# Patient Record
Sex: Female | Born: 1935
Health system: Southern US, Community
[De-identification: ages and names within clinical notes are randomized; demographics above are authoritative.]

## PROBLEM LIST (undated history)

## (undated) DIAGNOSIS — I10 Essential (primary) hypertension: Secondary | ICD-10-CM

## (undated) DIAGNOSIS — R7303 Prediabetes: Secondary | ICD-10-CM

## (undated) DIAGNOSIS — G5602 Carpal tunnel syndrome, left upper limb: Secondary | ICD-10-CM

## (undated) DIAGNOSIS — E785 Hyperlipidemia, unspecified: Secondary | ICD-10-CM

## (undated) DIAGNOSIS — K5900 Constipation, unspecified: Secondary | ICD-10-CM

## (undated) DIAGNOSIS — M1990 Unspecified osteoarthritis, unspecified site: Secondary | ICD-10-CM

## (undated) DIAGNOSIS — K219 Gastro-esophageal reflux disease without esophagitis: Secondary | ICD-10-CM

## (undated) HISTORY — PX: TONSILLECTOMY: SUR1361

## (undated) HISTORY — PX: ABDOMINAL HYSTERECTOMY: SHX81

## (undated) HISTORY — PX: APPENDECTOMY: SHX54

---

## 1996-11-07 HISTORY — PX: BACK SURGERY: SHX140

## 2000-09-19 ENCOUNTER — Ambulatory Visit (HOSPITAL_BASED_OUTPATIENT_CLINIC_OR_DEPARTMENT_OTHER): Admission: RE | Admit: 2000-09-19 | Discharge: 2000-09-19 | Payer: Self-pay | Admitting: Orthopedic Surgery

## 2001-11-27 ENCOUNTER — Emergency Department (HOSPITAL_COMMUNITY): Admission: EM | Admit: 2001-11-27 | Discharge: 2001-11-27 | Payer: Self-pay | Admitting: Emergency Medicine

## 2001-11-27 ENCOUNTER — Encounter: Payer: Self-pay | Admitting: Emergency Medicine

## 2001-12-28 ENCOUNTER — Ambulatory Visit (HOSPITAL_COMMUNITY): Admission: RE | Admit: 2001-12-28 | Discharge: 2001-12-28 | Payer: Self-pay | Admitting: Internal Medicine

## 2002-04-25 ENCOUNTER — Emergency Department (HOSPITAL_COMMUNITY): Admission: EM | Admit: 2002-04-25 | Discharge: 2002-04-26 | Payer: Self-pay | Admitting: Internal Medicine

## 2002-04-26 ENCOUNTER — Encounter: Payer: Self-pay | Admitting: Internal Medicine

## 2002-11-11 ENCOUNTER — Emergency Department (HOSPITAL_COMMUNITY): Admission: EM | Admit: 2002-11-11 | Discharge: 2002-11-11 | Payer: Self-pay | Admitting: *Deleted

## 2002-11-11 ENCOUNTER — Encounter: Payer: Self-pay | Admitting: *Deleted

## 2002-11-19 ENCOUNTER — Ambulatory Visit (HOSPITAL_BASED_OUTPATIENT_CLINIC_OR_DEPARTMENT_OTHER): Admission: RE | Admit: 2002-11-19 | Discharge: 2002-11-19 | Payer: Self-pay | Admitting: Orthopedic Surgery

## 2003-08-31 ENCOUNTER — Encounter: Payer: Self-pay | Admitting: Emergency Medicine

## 2003-08-31 ENCOUNTER — Emergency Department (HOSPITAL_COMMUNITY): Admission: EM | Admit: 2003-08-31 | Discharge: 2003-08-31 | Payer: Self-pay | Admitting: Internal Medicine

## 2003-09-30 ENCOUNTER — Ambulatory Visit (HOSPITAL_COMMUNITY): Admission: RE | Admit: 2003-09-30 | Discharge: 2003-09-30 | Payer: Self-pay | Admitting: Internal Medicine

## 2005-12-22 ENCOUNTER — Ambulatory Visit (HOSPITAL_BASED_OUTPATIENT_CLINIC_OR_DEPARTMENT_OTHER): Admission: RE | Admit: 2005-12-22 | Discharge: 2005-12-22 | Payer: Self-pay | Admitting: Orthopedic Surgery

## 2012-02-01 ENCOUNTER — Encounter (INDEPENDENT_AMBULATORY_CARE_PROVIDER_SITE_OTHER): Payer: Self-pay | Admitting: *Deleted

## 2012-02-09 ENCOUNTER — Other Ambulatory Visit (INDEPENDENT_AMBULATORY_CARE_PROVIDER_SITE_OTHER): Payer: Self-pay | Admitting: *Deleted

## 2012-02-09 ENCOUNTER — Encounter (INDEPENDENT_AMBULATORY_CARE_PROVIDER_SITE_OTHER): Payer: Self-pay | Admitting: *Deleted

## 2012-02-09 ENCOUNTER — Telehealth (INDEPENDENT_AMBULATORY_CARE_PROVIDER_SITE_OTHER): Payer: Self-pay | Admitting: *Deleted

## 2012-02-09 DIAGNOSIS — Z1211 Encounter for screening for malignant neoplasm of colon: Secondary | ICD-10-CM

## 2012-02-09 NOTE — Telephone Encounter (Signed)
Patient needs movi prep 

## 2012-02-15 MED ORDER — PEG-KCL-NACL-NASULF-NA ASC-C 100 G PO SOLR: 1.0000 | Freq: Once | ORAL | Status: DC

## 2012-02-17 ENCOUNTER — Encounter (INDEPENDENT_AMBULATORY_CARE_PROVIDER_SITE_OTHER): Payer: Self-pay

## 2012-04-03 ENCOUNTER — Telehealth (INDEPENDENT_AMBULATORY_CARE_PROVIDER_SITE_OTHER): Payer: Self-pay | Admitting: *Deleted

## 2012-04-03 NOTE — Telephone Encounter (Signed)
PCP/Requesting MD: fagan  Name & DOB: Terri Mccall 07/31/1934     Procedure: tcs  Reason/Indication:  screening  Has patient had this procedure before?  yes  If so, when, by whom and where?  2003  Is there a family history of colon cancer?  no  Who?  What age when diagnosed?    Is patient diabetic?   Yes, diet controlled      Does patient have prosthetic heart valve?  no  Do you have a pacemaker?  no  Has patient had joint replacement within last 12 months?  no  Is patient on Coumadin, Plavix and/or Aspirin? yes  Medications: asa 81 mg daily, benicar 40 mg daily, atenolol 50 mg bid, chlorthalidone 25 mg daily, simvastatin 20 mg daily,potassium 20 meq daily, calcium, fish oil  Allergies: nkda  Medication Adjustment: asa 2 days  Procedure date & time: 04/26/12 @ 830

## 2012-04-05 NOTE — Telephone Encounter (Signed)
agree

## 2012-04-24 ENCOUNTER — Encounter (HOSPITAL_COMMUNITY): Payer: Self-pay | Admitting: Pharmacy Technician

## 2012-04-25 MED ORDER — SODIUM CHLORIDE 0.45 % IV SOLN
Freq: Once | INTRAVENOUS | Status: AC
  Administered 2012-04-26: 08:00:00 via INTRAVENOUS

## 2012-04-26 ENCOUNTER — Encounter (HOSPITAL_COMMUNITY): Payer: Self-pay | Admitting: *Deleted

## 2012-04-26 ENCOUNTER — Ambulatory Visit (HOSPITAL_COMMUNITY)
Admission: RE | Admit: 2012-04-26 | Discharge: 2012-04-26 | Disposition: A | Discharge status: Home Health | Payer: Medicare Other | Source: Ambulatory Visit | Attending: Internal Medicine | Admitting: Internal Medicine

## 2012-04-26 ENCOUNTER — Encounter (HOSPITAL_COMMUNITY)
Admission: RE | Disposition: A | Discharge status: Home Health | Payer: Self-pay | Source: Ambulatory Visit | Attending: Internal Medicine

## 2012-04-26 DIAGNOSIS — Z1211 Encounter for screening for malignant neoplasm of colon: Secondary | ICD-10-CM

## 2012-04-26 DIAGNOSIS — E785 Hyperlipidemia, unspecified: Secondary | ICD-10-CM | POA: Insufficient documentation

## 2012-04-26 DIAGNOSIS — E119 Type 2 diabetes mellitus without complications: Secondary | ICD-10-CM | POA: Insufficient documentation

## 2012-04-26 DIAGNOSIS — Z7982 Long term (current) use of aspirin: Secondary | ICD-10-CM | POA: Insufficient documentation

## 2012-04-26 DIAGNOSIS — I10 Essential (primary) hypertension: Secondary | ICD-10-CM | POA: Insufficient documentation

## 2012-04-26 DIAGNOSIS — D126 Benign neoplasm of colon, unspecified: Secondary | ICD-10-CM | POA: Insufficient documentation

## 2012-04-26 DIAGNOSIS — Z79899 Other long term (current) drug therapy: Secondary | ICD-10-CM | POA: Insufficient documentation

## 2012-04-26 DIAGNOSIS — K644 Residual hemorrhoidal skin tags: Secondary | ICD-10-CM

## 2012-04-26 HISTORY — PX: COLONOSCOPY: SHX5424

## 2012-04-26 HISTORY — DX: Essential (primary) hypertension: I10

## 2012-04-26 HISTORY — DX: Constipation, unspecified: K59.00

## 2012-04-26 HISTORY — DX: Hyperlipidemia, unspecified: E78.5

## 2012-04-26 HISTORY — DX: Unspecified osteoarthritis, unspecified site: M19.90

## 2012-04-26 SURGERY — COLONOSCOPY
Anesthesia: Moderate Sedation

## 2012-04-26 MED ORDER — MIDAZOLAM HCL 5 MG/5ML IJ SOLN
INTRAMUSCULAR | Status: AC
  Filled 2012-04-26: qty 10

## 2012-04-26 MED ORDER — MIDAZOLAM HCL 5 MG/5ML IJ SOLN
INTRAMUSCULAR | Status: DC | PRN
  Administered 2012-04-26 (×2): 2 mg via INTRAVENOUS
  Administered 2012-04-26 (×2): 1 mg via INTRAVENOUS

## 2012-04-26 MED ORDER — MEPERIDINE HCL 50 MG/ML IJ SOLN
INTRAMUSCULAR | Status: DC | PRN
  Administered 2012-04-26 (×2): 25 mg via INTRAVENOUS

## 2012-04-26 MED ORDER — MEPERIDINE HCL 50 MG/ML IJ SOLN
INTRAMUSCULAR | Status: AC
  Filled 2012-04-26: qty 1

## 2012-04-26 MED ORDER — STERILE WATER FOR IRRIGATION IR SOLN
Status: DC | PRN
  Administered 2012-04-26: 09:00:00

## 2012-04-26 NOTE — Discharge Instructions (Signed)
Resume usual medications and high fiber diet. No driving for 24 hours. Physician will contact you with biopsy results.  Colonoscopy Care After These instructions give you information on caring for yourself after your procedure. Your doctor may also give you more specific instructions. Call your doctor if you have any problems or questions after your procedure. HOME CARE  Take it easy for the next 24 hours.   Rest.   Walk or use warm packs on your belly (abdomen) if you have belly cramping or gas.   Do not drive for 24 hours.   You may shower.   Do not sign important papers or use machinery for 24 hours.   Drink enough fluids to keep your pee (urine) clear or pale yellow.   Resume your normal diet. Avoid heavy or fried foods.   Avoid alcohol.   Continue taking your normal medicines.   Only take medicine as told by your doctor. Do not take aspirin.  If you had growths (polyps) removed:  Do not take aspirin.   Do not drink alcohol for 7 days or as told by your doctor.   Eat a soft diet for 24 hours.  GET HELP RIGHT AWAY IF:  You have a fever.   You pass clumps of tissue (blood clots) or fill the toilet with blood.   You have belly pain that gets worse and medicine does not help.   Your belly is puffy (swollen).   You feel sick to your stomach (nauseous) or throw up (vomit).  MAKE SURE YOU:  Understand these instructions.   Will watch your condition.   Will get help right away if you are not doing well or get worse.  Document Released: 11/26/2010 Document Revised: 10/13/2011 Document Reviewed: 11/26/2010 [facility] Patient Information 2012 [place], [place].  Colon Polyps A polyp is extra tissue that grows inside your body. Colon polyps grow in the large intestine. The large intestine, also called the colon, is part of your digestive system. It is a long, hollow tube at the end of your digestive tract where your body makes and stores stool. Most polyps are not  dangerous. They are benign. This means they are not cancerous. But over time, some types of polyps can turn into cancer. Polyps that are smaller than a pea are usually not harmful. But larger polyps could someday become or may already be cancerous. To be safe, doctors remove all polyps and test them.  WHO GETS POLYPS? Anyone can get polyps, but certain people are more likely than others. You may have a greater chance of getting polyps if:  You are over 50.   You have had polyps before.   Someone in your family has had polyps.   Someone in your family has had cancer of the large intestine.   Find out if someone in your family has had polyps. You may also be more likely to get polyps if you:   Eat a lot of fatty foods.   Smoke.   Drink alcohol.   Do not exercise.   Eat too much.  SYMPTOMS  Most small polyps do not cause symptoms. People often do not know they have one until their caregiver finds it during a regular checkup or while testing them for something else. Some people do have symptoms like these:  Bleeding from the anus. You might notice blood on your underwear or on toilet paper after you have had a bowel movement.   Constipation or diarrhea that lasts more than a  week.   Blood in the stool. Blood can make stool look black or it can show up as red streaks in the stool.  If you have any of these symptoms, see your caregiver. HOW DOES THE DOCTOR TEST FOR POLYPS? The doctor can use four tests to check for polyps:  Digital rectal exam. The caregiver wears gloves and checks your rectum (the last part of the large intestine) to see if it feels normal. This test would find polyps only in the rectum. Your caregiver may need to do one of the other tests listed below to find polyps higher up in the intestine.   Barium enema. The caregiver puts a liquid called barium into your rectum before taking x-rays of your large intestine. Barium makes your intestine look white in the  pictures. Polyps are dark, so they are easy to see.   Sigmoidoscopy. With this test, the caregiver can see inside your large intestine. A thin flexible tube is placed into your rectum. The device is called a sigmoidoscope, which has a light and a tiny video camera in it. The caregiver uses the sigmoidoscope to look at the last third of your large intestine.   Colonoscopy. This test is like sigmoidoscopy, but the caregiver looks at all of the large intestine. It usually requires sedation. This is the most common method for finding and removing polyps.  TREATMENT   The caregiver will remove the polyp during sigmoidoscopy or colonoscopy. The polyp is then tested for cancer.   If you have had polyps, your caregiver may want you to get tested regularly in the future.  PREVENTION  There is not one sure way to prevent polyps. You might be able to lower your risk of getting them if you:  Eat more fruits and vegetables and less fatty food.   Do not smoke.   Avoid alcohol.   Exercise every day.   Lose weight if you are overweight.   Eating more calcium and folate can also lower your risk of getting polyps. Some foods that are rich in calcium are milk, cheese, and broccoli. Some foods that are rich in folate are chickpeas, kidney beans, and spinach.   Aspirin might help prevent polyps. Studies are under way.  Document Released: 07/20/2004 Document Revised: 10/13/2011 Document Reviewed: 12/26/2007 [facility] Patient Information 2012 [place], [place].

## 2012-04-26 NOTE — H&P (Signed)
Terri Mccall is an 78 y.o. female.   Chief Complaint: Patient is here for colonoscopy. HPI: Patient is 78 year old Caucasian female with a screening colonoscopy. Patient's last exam 10 years ago. She denies abdominal pain or rectal bleeding. She has chronic constipation easily controlled with high fiber diet and MiraLax. Family history is negative for colorectal carcinoma.  Past Medical History  Diagnosis Date  . Hypertension   . Hyperlipidemia   . Diabetes mellitus     diet controlled  . Constipation   . Arthritis     Past Surgical History  Procedure Date  . Abdominal hysterectomy   . Tonsillectomy   . Back surgery 1998  . Appendectomy     History reviewed. No pertinent family history. Social History:  reports that she has never smoked. She does not have any smokeless tobacco history on file. She reports that she drinks alcohol. She reports that she does not use illicit drugs.  Allergies:  Allergies  Allergen Reactions  . Bextra (Valdecoxib) Hives    Medications Prior to Admission  Medication Sig Dispense Refill  . atenolol (TENORMIN) 50 MG tablet Take 50 mg by mouth 2 (two) times daily.      . beta carotene w/minerals (OCUVITE) tablet Take 1 tablet by mouth every morning.      . chlorthalidone (HYGROTON) 25 MG tablet Take 25 mg by mouth every morning.      . Cinnamon 500 MG capsule Take 500 mg by mouth daily.      . Cranberry (SM CRANBERRY) 300 MG tablet Take 300 mg by mouth every morning.      . loratadine (CLARITIN) 10 MG tablet Take 10 mg by mouth every morning.      . naproxen sodium (ANAPROX) 220 MG tablet Take 220 mg by mouth 2 (two) times daily with a meal.      . olmesartan (BENICAR) 40 MG tablet Take 40 mg by mouth every morning.      . peg 3350 powder (MOVIPREP) SOLR Take 1 kit (100 g total) by mouth once.  1 kit  0  . potassium chloride SA (K-DUR,KLOR-CON) 20 MEQ tablet Take 20 mEq by mouth every morning.      Marland Kitchen Propylene Glycol (SYSTANE BALANCE) 0.6 % SOLN  Apply 1 drop to eye 3 (three) times daily as needed. For dry eyes      . pyridOXINE (VITAMIN B-6) 100 MG tablet Take 100 mg by mouth every morning.      . simvastatin (ZOCOR) 20 MG tablet Take 20 mg by mouth every evening.      . thiamine (VITAMIN B-1) 100 MG tablet Take 100 mg by mouth every morning.      . vitamin B-12 (CYANOCOBALAMIN) 1000 MCG tablet Take 1,000 mcg by mouth every morning.      Marland Kitchen aspirin EC 81 MG tablet Take 81 mg by mouth every evening.        No results found for this or any previous visit (from the past 48 hour(s)). No results found.  ROS  Blood pressure 171/74, pulse 79, temperature 97.8 F (36.6 C), temperature source Oral, resp. rate 12, height 5\' 1"  (1.549 m), weight 148 lb (67.132 kg), SpO2 96.00%. Physical Exam  Constitutional: She appears well-developed and well-nourished.  HENT:  Mouth/Throat: Oropharynx is clear and moist.  Eyes: Conjunctivae are normal. No scleral icterus.  Neck: No thyromegaly present.  Cardiovascular: Normal rate, regular rhythm and normal heart sounds.   No murmur heard. Respiratory: Effort normal and  breath sounds normal.  GI: Soft. She exhibits no distension and no mass. There is no tenderness.  Musculoskeletal: She exhibits no edema.  Lymphadenopathy:    She has no cervical adenopathy.  Neurological: She is alert.  Skin: Skin is warm and dry.     Assessment/Plan Average risk screening colonoscopy.  Vivan Vanderveer U 04/26/2012, 8:52 AM

## 2012-04-26 NOTE — Op Note (Signed)
COLONOSCOPY PROCEDURE REPORT  PATIENT:  Terri Mccall  MR#:  [id number] Birthdate:  08-30-1934, 78 y.o., female Endoscopist:  Dr. Malissa Hippo, MD Referred By:  Dr. Carylon Perches, MD Procedure Date: 04/26/2012  Procedure:   Colonoscopy  Indications: Patient is 78 year old Caucasian female who is in for average risk screening colonoscopy. Last exam was 10 years ago.  Informed Consent:  The procedure and risks were reviewed with the patient and informed consent was obtained.  Medications:  Demerol 50 mg IV Versed 6 mg IV  Description of procedure:  After a digital rectal exam was performed, that colonoscope was advanced from the anus through the rectum and colon to the area of the cecum, ileocecal valve and appendiceal orifice. The cecum was deeply intubated. These structures were well-seen and photographed for the record. From the level of the cecum and ileocecal valve, the scope was slowly and cautiously withdrawn. The mucosal surfaces were carefully surveyed utilizing scope tip to flexion to facilitate fold flattening as needed. The scope was pulled down into the rectum where a thorough exam including retroflexion was performed.  Findings:   Prep excellent. Small cecal polyp ablated via cold biopsy. Mucosa rest of the colon was normal. Normal rectal mucosa. Small hemorrhoids below the dentate line.  Therapeutic/Diagnostic Maneuvers Performed:  See  Complications:  None  Cecal Withdrawal Time:  10 minutes  Impression:  Examination performed to cecum. Small cecal polyp ablated via cold biopsy. External hemorrhoids.  Recommendations:  Standard instructions given. I will contact patient with results of biopsy. Given today's findings she will not need screening for other 10 years.  Rhonda Linan U  04/26/2012 9:29 AM  CC: Dr. Carylon Perches, MD & Dr. Bonnetta Barry ref. provider found

## 2012-04-30 ENCOUNTER — Encounter (HOSPITAL_COMMUNITY): Payer: Self-pay | Admitting: Internal Medicine

## 2012-05-01 ENCOUNTER — Encounter (INDEPENDENT_AMBULATORY_CARE_PROVIDER_SITE_OTHER): Payer: Self-pay | Admitting: *Deleted

## 2012-11-06 ENCOUNTER — Other Ambulatory Visit: Payer: Self-pay | Admitting: Orthopedic Surgery

## 2012-11-16 ENCOUNTER — Encounter (HOSPITAL_BASED_OUTPATIENT_CLINIC_OR_DEPARTMENT_OTHER)
Admission: RE | Admit: 2012-11-16 | Discharge: 2012-11-16 | Disposition: A | Discharge status: Home / Self Care | Payer: Medicare Other | Source: Ambulatory Visit | Attending: Orthopedic Surgery | Admitting: Orthopedic Surgery

## 2012-11-16 ENCOUNTER — Encounter (HOSPITAL_BASED_OUTPATIENT_CLINIC_OR_DEPARTMENT_OTHER): Payer: Self-pay | Admitting: *Deleted

## 2012-11-16 ENCOUNTER — Other Ambulatory Visit: Payer: Self-pay

## 2012-11-16 LAB — BASIC METABOLIC PANEL
Chloride: 96 mEq/L (ref 96–112)
GFR calc non Af Amer: 86 mL/min — ABNORMAL LOW (ref >90)
Glucose, Bld: 106 mg/dL — ABNORMAL HIGH (ref 70–99)
Potassium: 3.5 mEq/L (ref 3.5–5.1)
Sodium: 135 mEq/L (ref 135–145)

## 2012-11-16 NOTE — Progress Notes (Signed)
bmet and ekg done

## 2012-11-20 ENCOUNTER — Encounter (HOSPITAL_BASED_OUTPATIENT_CLINIC_OR_DEPARTMENT_OTHER): Payer: Self-pay | Admitting: Anesthesiology

## 2012-11-20 ENCOUNTER — Encounter (HOSPITAL_BASED_OUTPATIENT_CLINIC_OR_DEPARTMENT_OTHER): Payer: Self-pay

## 2012-11-20 ENCOUNTER — Encounter (HOSPITAL_BASED_OUTPATIENT_CLINIC_OR_DEPARTMENT_OTHER)
Admission: RE | Disposition: A | Discharge status: Home / Self Care | Payer: Self-pay | Source: Ambulatory Visit | Attending: Orthopedic Surgery

## 2012-11-20 ENCOUNTER — Ambulatory Visit (HOSPITAL_BASED_OUTPATIENT_CLINIC_OR_DEPARTMENT_OTHER)
Admission: RE | Admit: 2012-11-20 | Discharge: 2012-11-20 | Disposition: A | Discharge status: Home / Self Care | Payer: Medicare Other | Source: Ambulatory Visit | Attending: Orthopedic Surgery | Admitting: Orthopedic Surgery

## 2012-11-20 ENCOUNTER — Ambulatory Visit (HOSPITAL_BASED_OUTPATIENT_CLINIC_OR_DEPARTMENT_OTHER): Payer: Medicare Other | Admitting: Anesthesiology

## 2012-11-20 DIAGNOSIS — E119 Type 2 diabetes mellitus without complications: Secondary | ICD-10-CM | POA: Insufficient documentation

## 2012-11-20 DIAGNOSIS — Z01812 Encounter for preprocedural laboratory examination: Secondary | ICD-10-CM | POA: Insufficient documentation

## 2012-11-20 DIAGNOSIS — M65839 Other synovitis and tenosynovitis, unspecified forearm: Secondary | ICD-10-CM | POA: Insufficient documentation

## 2012-11-20 DIAGNOSIS — M6530 Trigger finger, unspecified finger: Secondary | ICD-10-CM | POA: Insufficient documentation

## 2012-11-20 DIAGNOSIS — M65849 Other synovitis and tenosynovitis, unspecified hand: Secondary | ICD-10-CM | POA: Insufficient documentation

## 2012-11-20 DIAGNOSIS — Z7982 Long term (current) use of aspirin: Secondary | ICD-10-CM | POA: Insufficient documentation

## 2012-11-20 DIAGNOSIS — E785 Hyperlipidemia, unspecified: Secondary | ICD-10-CM | POA: Insufficient documentation

## 2012-11-20 DIAGNOSIS — I10 Essential (primary) hypertension: Secondary | ICD-10-CM | POA: Insufficient documentation

## 2012-11-20 DIAGNOSIS — Z79899 Other long term (current) drug therapy: Secondary | ICD-10-CM | POA: Insufficient documentation

## 2012-11-20 HISTORY — PX: TRIGGER FINGER RELEASE: SHX641

## 2012-11-20 LAB — POCT HEMOGLOBIN-HEMACUE: Hemoglobin: 13.5 g/dL (ref 12.0–15.0)

## 2012-11-20 SURGERY — RELEASE, A1 PULLEY, FOR TRIGGER FINGER
Anesthesia: Regional | Site: Finger | Laterality: Right | Wound class: Clean

## 2012-11-20 MED ORDER — PROPOFOL INFUSION 10 MG/ML OPTIME
INTRAVENOUS | Status: DC | PRN
  Administered 2012-11-20: 75 ug/kg/min via INTRAVENOUS

## 2012-11-20 MED ORDER — LACTATED RINGERS IV SOLN
INTRAVENOUS | Status: DC
  Administered 2012-11-20 (×2): via INTRAVENOUS

## 2012-11-20 MED ORDER — FENTANYL CITRATE 0.05 MG/ML IJ SOLN
INTRAMUSCULAR | Status: DC | PRN
  Administered 2012-11-20: 50 ug via INTRAVENOUS

## 2012-11-20 MED ORDER — HYDROCODONE-ACETAMINOPHEN 5-325 MG PO TABS: 1.0000 | ORAL_TABLET | Freq: Four times a day (QID) | ORAL | Status: DC | PRN

## 2012-11-20 MED ORDER — HYDROMORPHONE HCL PF 1 MG/ML IJ SOLN: 0.2500 mg | INTRAMUSCULAR | Status: DC | PRN

## 2012-11-20 MED ORDER — OXYCODONE HCL 5 MG/5ML PO SOLN: 5.0000 mg | Freq: Once | ORAL | Status: DC | PRN

## 2012-11-20 MED ORDER — CEFAZOLIN SODIUM-DEXTROSE 2-3 GM-% IV SOLR
2.0000 g | INTRAVENOUS | Status: AC
  Administered 2012-11-20: 2 g via INTRAVENOUS

## 2012-11-20 MED ORDER — CHLORHEXIDINE GLUCONATE 4 % EX LIQD: 60.0000 mL | Freq: Once | CUTANEOUS | Status: DC

## 2012-11-20 MED ORDER — PROMETHAZINE HCL 25 MG/ML IJ SOLN: 6.2500 mg | INTRAMUSCULAR | Status: DC | PRN

## 2012-11-20 MED ORDER — MEPERIDINE HCL 25 MG/ML IJ SOLN: 6.2500 mg | INTRAMUSCULAR | Status: DC | PRN

## 2012-11-20 MED ORDER — OXYCODONE HCL 5 MG PO TABS: 5.0000 mg | ORAL_TABLET | Freq: Once | ORAL | Status: DC | PRN

## 2012-11-20 MED ORDER — BUPIVACAINE HCL (PF) 0.25 % IJ SOLN
INTRAMUSCULAR | Status: DC | PRN
  Administered 2012-11-20: 4 mL

## 2012-11-20 SURGICAL SUPPLY — 36 items
BANDAGE COBAN STERILE 2 (GAUZE/BANDAGES/DRESSINGS) ×2 IMPLANT
BANDAGE GAUZE ELAST BULKY 4 IN (GAUZE/BANDAGES/DRESSINGS) ×1 IMPLANT
BLADE SURG 15 STRL LF DISP TIS (BLADE) ×1 IMPLANT
BLADE SURG 15 STRL SS (BLADE) ×2
BNDG CMPR 9X4 STRL LF SNTH (GAUZE/BANDAGES/DRESSINGS)
BNDG ESMARK 4X9 LF (GAUZE/BANDAGES/DRESSINGS) IMPLANT
CHLORAPREP W/TINT 26ML (MISCELLANEOUS) ×2 IMPLANT
CLOTH BEACON ORANGE TIMEOUT ST (SAFETY) ×2 IMPLANT
CORDS BIPOLAR (ELECTRODE) IMPLANT
COVER MAYO STAND STRL (DRAPES) ×2 IMPLANT
COVER TABLE BACK 60X90 (DRAPES) ×2 IMPLANT
CUFF TOURNIQUET SINGLE 18IN (TOURNIQUET CUFF) ×1 IMPLANT
DECANTER SPIKE VIAL GLASS SM (MISCELLANEOUS) IMPLANT
DRAPE EXTREMITY T 121X128X90 (DRAPE) ×2 IMPLANT
DRAPE SURG 17X23 STRL (DRAPES) ×2 IMPLANT
GAUZE XEROFORM 1X8 LF (GAUZE/BANDAGES/DRESSINGS) ×2 IMPLANT
GLOVE BIO SURGEON STRL SZ 6.5 (GLOVE) ×3 IMPLANT
GLOVE BIO SURGEON STRL SZ7.5 (GLOVE) ×2 IMPLANT
GLOVE BIOGEL PI IND STRL 8.5 (GLOVE) ×1 IMPLANT
GLOVE BIOGEL PI INDICATOR 8.5 (GLOVE) ×1
GLOVE SURG ORTHO 8.0 STRL STRW (GLOVE) ×3 IMPLANT
GOWN BRE IMP PREV XXLGXLNG (GOWN DISPOSABLE) ×4 IMPLANT
GOWN PREVENTION PLUS XLARGE (GOWN DISPOSABLE) ×3 IMPLANT
NEEDLE 27GAX1X1/2 (NEEDLE) ×1 IMPLANT
NS IRRIG 1000ML POUR BTL (IV SOLUTION) ×2 IMPLANT
PACK BASIN DAY SURGERY FS (CUSTOM PROCEDURE TRAY) ×2 IMPLANT
PADDING CAST ABS 4INX4YD NS (CAST SUPPLIES) ×1
PADDING CAST ABS COTTON 4X4 ST (CAST SUPPLIES) ×1 IMPLANT
SPONGE GAUZE 4X4 12PLY (GAUZE/BANDAGES/DRESSINGS) ×2 IMPLANT
STOCKINETTE 4X48 STRL (DRAPES) ×2 IMPLANT
SUT VICRYL RAPIDE 4/0 PS 2 (SUTURE) ×2 IMPLANT
SYR BULB 3OZ (MISCELLANEOUS) ×2 IMPLANT
SYR CONTROL 10ML LL (SYRINGE) ×1 IMPLANT
TOWEL OR 17X24 6PK STRL BLUE (TOWEL DISPOSABLE) ×4 IMPLANT
UNDERPAD 30X30 INCONTINENT (UNDERPADS AND DIAPERS) ×2 IMPLANT
WATER STERILE IRR 1000ML POUR (IV SOLUTION) ×2 IMPLANT

## 2012-11-20 NOTE — Anesthesia Preprocedure Evaluation (Signed)
Anesthesia Evaluation  Patient identified by MRN, date of birth, ID band Patient awake    Reviewed: Allergy & Precautions, H&P , NPO status   History of Anesthesia Complications Negative for: history of anesthetic complications  Airway Mallampati: I  Neck ROM: Full    Dental  (+) Teeth Intact   Pulmonary neg pulmonary ROS,  breath sounds clear to auscultation        Cardiovascular hypertension, Rhythm:Regular Rate:Normal     Neuro/Psych negative neurological ROS     GI/Hepatic negative GI ROS, Neg liver ROS,   Endo/Other  diabetes  Renal/GU      Musculoskeletal   Abdominal   Peds  Hematology negative hematology ROS (+)   Anesthesia Other Findings   Reproductive/Obstetrics                           Anesthesia Physical Anesthesia Plan  ASA: II  Anesthesia Plan: Bier Block   Post-op Pain Management:    Induction: Intravenous  Airway Management Planned: Natural Airway and Simple Face Mask  Additional Equipment:   Intra-op Plan:   Post-operative Plan:   Informed Consent: I have reviewed the patients History and Physical, chart, labs and discussed the procedure including the risks, benefits and alternatives for the proposed anesthesia with the patient or authorized representative who has indicated his/her understanding and acceptance.     Plan Discussed with: CRNA and Surgeon  Anesthesia Plan Comments:         Anesthesia Quick Evaluation

## 2012-11-20 NOTE — Brief Op Note (Signed)
11/20/2012  10:16 AM  PATIENT:  Joan Flores  79 y.o. female  PRE-OPERATIVE DIAGNOSIS:  STS RIF  POST-OPERATIVE DIAGNOSIS:  STS RIF(stenosing tenosynovitis) right index finger  PROCEDURE:  Procedure(s) (LRB) with comments: RELEASE TRIGGER FINGER/A-1 PULLEY (Right) - RELEASE A-1 PULLEY RIGHT INDEX FINGER  SURGEON:  Surgeon(s) and Role:    * Nicki Reaper, MD - Primary    * Tami Ribas, MD - Assisting  PHYSICIAN ASSISTANT:   ASSISTANTS: K Knight Oelkers,MD   ANESTHESIA:   local and regional  EBL:  Total I/O In: 800 [I.V.:800] Out: -   BLOOD ADMINISTERED:none  DRAINS: none   LOCAL MEDICATIONS USED:  MARCAINE     SPECIMEN:  No Specimen  DISPOSITION OF SPECIMEN:  N/A  COUNTS:  YES  TOURNIQUET:   Total Tourniquet Time Documented: Forearm (Right) - 32 minutes  DICTATION: .Other Dictation: Dictation Number [id number]  PLAN OF CARE: Discharge to home after PACU  PATIENT DISPOSITION:  PACU - hemodynamically stable.

## 2012-11-20 NOTE — Transfer of Care (Signed)
Immediate Anesthesia Transfer of Care Note  Patient: Terri Mccall  Procedure(s) Performed: Procedure(s) (LRB) with comments: RELEASE TRIGGER FINGER/A-1 PULLEY (Right) - RELEASE A-1 PULLEY RIGHT INDEX FINGER  Patient Location: PACU  Anesthesia Type:Bier block  Level of Consciousness: awake and patient cooperative  Airway & Oxygen Therapy: Patient Spontanous Breathing and Patient connected to face mask oxygen  Post-op Assessment: Report given to PACU RN and Post -op Vital signs reviewed and stable  Post vital signs: Reviewed and stable  Complications: No apparent anesthesia complications

## 2012-11-20 NOTE — Op Note (Signed)
NAMEETHELDA, DEANGELO NO.:  [id number]  MEDICAL RECORD NO.:  [id number]  LOCATION:                                 FACILITY:  PHYSICIAN:  Cindee Salt, M.D.            DATE OF BIRTH:  DATE OF PROCEDURE:  11/20/2012 DATE OF DISCHARGE:                              OPERATIVE REPORT   PREOPERATIVE DIAGNOSIS:  Recurrence stenosing tenosynovitis right index finger.  POSTOPERATIVE DIAGNOSIS:  Recurrence stenosing tenosynovitis right index finger.  OPERATION:  Excision of ulnar limb superficialis tendon right index finger with release A1 pulley.  SURGEON:  Betha Loa, MD  ANESTHESIA:  Forearm-based IV regional with local infiltration.  ANESTHESIOLOGIST:  Burna Forts, M.D.  HISTORY:  The patient is a 79 year old female with a history of triggering of her right index finger.  She has undergone release of this approximately 4 years ago.  This has recurred and it has not responded to injections.  She is elected to have this surgically released with the possibility of excision of 1 limb of the superficialis tendon. Pre, peri, and postoperative course have been discussed along with risks and complications.  She is aware there is no guarantee with surgery; possibility of infection; recurrence of injury to arteries, nerves, tendons, incomplete relief of symptoms, and dystrophy.  In the preoperative area, the patient was seen, the extremity marked by both patient and surgeon.  Antibiotic given.  PROCEDURE:  The patient was brought to the operating room where a forearm-based IV regional anesthetic was carried out without difficulty. She was prepped using ChloraPrep, supine position with the right arm free.  A 3-minute dry time was allowed.  Time-out taken, confirming patient and procedure.  The old incision was used, carried down through subcutaneous tissue.  The A1 pulley was released after protection of neurovascular bundles with release there was still  continued triggering and locking of the finger.  The wound was extended distally.  This all to be in the ulnar limb of the superficialis tendon. The superficialis ulnar limb was then incised approximately, delivered distally at the A3 pulley, and removed the triggering entirely abated.  The tendon was smoothed proximally to allow no catching of the proximal aspect. Partial tenosynovectomy performed.  The wound was copiously irrigated with saline and the skin closed with interrupted with 4-0 Vicryl Rapide sutures.  Sterile compressive dressing to the index middle finger were applied.  On deflation of the tourniquet, all fingers immediately pinked prior to placement of the dressing, after closure the wound the area was infiltrated with 0.25% Marcaine without epinephrine, 5 mL was used.  The patient tolerated the procedure well and was taken to the recovery room for observation in satisfactory condition.  She will be discharged home, to return in 1 week on Vicodin.          ______________________________ Cindee Salt, M.D.     GK/MEDQ  D:  11/20/2012  T:  11/20/2012  Job:  [id number]

## 2012-11-20 NOTE — Op Note (Signed)
Dictation Number [id number]

## 2012-11-20 NOTE — H&P (Signed)
Terri Mccall  has developed popping of her right index finger. . She has had this released in 2004. She recalls no history of injury. She has diabetes diet controlled as well as elevated BP. This has been  injected for a second time with Celestone and Xylocaine in that it is still triggering.  , this is injected for a second time with Celestone and Xylocaine in that it is still triggering.  Sensation and circulation are otherwise intact.  She shows no swelling.    PAST MEDICAL HISTORY: She is allergic to Bextra. She is on Atenolol, Benicar, potassium, Chlorthalidone, simvastatin. She has had back surgery, hysterectomy and hand surgery.  FAMILY H ISTORY: Positive for diabetes, heart disease, and high BP.  SOCIAL HISTORY: She does not smoke. She drinks socially. She is married and retired.   REVIEW OF SYSTEMS: Positive for glasses, high BP, otherwise negative for 14 points. Terri Mccall is an 79 y.o. female.   Chief Complaint: Recurrent STS Rt index HPI: see above  Past Medical History  Diagnosis Date  . Hypertension   . Hyperlipidemia   . Diabetes mellitus     diet controlled  . Constipation   . Arthritis     Past Surgical History  Procedure Date  . Abdominal hysterectomy   . Tonsillectomy   . Back surgery 1998  . Appendectomy   . Colonoscopy 04/26/2012    Procedure: COLONOSCOPY;  Surgeon: Malissa Hippo, MD;  Location: [facility];  Service: Endoscopy;  Laterality: N/A;  830    History reviewed. No pertinent family history. Social History:  reports that she has never smoked. She does not have any smokeless tobacco history on file. She reports that she drinks alcohol. She reports that she does not use illicit drugs.  Allergies:  Allergies  Allergen Reactions  . Bextra (Valdecoxib) Hives    Medications Prior to Admission  Medication Sig Dispense Refill  . aspirin EC 81 MG tablet Take 81 mg by mouth every evening.      Marland Kitchen atenolol (TENORMIN) 50 MG tablet Take 50 mg by  mouth 2 (two) times daily.      . beta carotene w/minerals (OCUVITE) tablet Take 1 tablet by mouth every morning.      . chlorthalidone (HYGROTON) 25 MG tablet Take 25 mg by mouth every morning.      . Cinnamon 500 MG capsule Take 500 mg by mouth daily.      . Cranberry (SM CRANBERRY) 300 MG tablet Take 300 mg by mouth every morning.      . loratadine (CLARITIN) 10 MG tablet Take 10 mg by mouth every morning.      . naproxen sodium (ANAPROX) 220 MG tablet Take 220 mg by mouth 2 (two) times daily with a meal.      . olmesartan (BENICAR) 40 MG tablet Take 40 mg by mouth every morning.      . potassium chloride SA (K-DUR,KLOR-CON) 20 MEQ tablet Take 20 mEq by mouth every morning.      Marland Kitchen Propylene Glycol (SYSTANE BALANCE) 0.6 % SOLN Apply 1 drop to eye 3 (three) times daily as needed. For dry eyes      . pyridOXINE (VITAMIN B-6) 100 MG tablet Take 100 mg by mouth every morning.      . simvastatin (ZOCOR) 20 MG tablet Take 20 mg by mouth every evening.      . thiamine (VITAMIN B-1) 100 MG tablet Take 100 mg by mouth every morning.      Marland Kitchen  vitamin B-12 (CYANOCOBALAMIN) 1000 MCG tablet Take 1,000 mcg by mouth every morning.        No results found for this or any previous visit (from the past 48 hour(s)).  No results found.   Pertinent items are noted in HPI.  Blood pressure 178/81, pulse 75, temperature 97.7 F (36.5 C), temperature source Oral, resp. rate 20, height 5' 0.5" (1.537 m), weight 68.675 kg (151 lb 6.4 oz), SpO2 98.00%.  General appearance: alert, cooperative and appears stated age Head: Normocephalic, without obvious abnormality Neck: no adenopathy and no JVD Resp: clear to auscultation bilaterally Cardio: regular rate and rhythm, S1, S2 normal, no murmur, click, rub or gallop GI: soft, non-tender; bowel sounds normal; no masses,  no organomegaly Extremities: extremities normal, atraumatic, no cyanosis or edema Pulses: 2+ and symmetric Skin: Skin color, texture, turgor normal.  No rashes or lesions Neurologic: Grossly normal Incision/Wound: na  Assessment/Plan We would recommend release of the A-1 pulley right index finger possible excision of one limb of superficialis. She is in agreement. The pre, peri and post op course are discussed along with risks and complications.  She is aware there is no guarantee with surgery, possibility of infection, recurrence, injury to arteries, nerves and tendons, incomplete relief of symptoms and dystrophy.  She is scheduled for release A-1 pulley right index finger as an outpatient under regional anesthesia.   [name] 11/20/2012, 8:39 AM

## 2012-11-20 NOTE — Anesthesia Postprocedure Evaluation (Signed)
  Anesthesia Post-op Note  Patient: Terri Mccall  Procedure(s) Performed: Procedure(s) (LRB) with comments: RELEASE TRIGGER FINGER/A-1 PULLEY (Right) - RELEASE A-1 PULLEY RIGHT INDEX FINGER  Patient Location: PACU  Anesthesia Type:Bier block  Level of Consciousness: awake  Airway and Oxygen Therapy: Patient Spontanous Breathing  Post-op Pain: mild  Post-op Assessment: Post-op Vital signs reviewed  Post-op Vital Signs: stable  Complications: No apparent anesthesia complications

## 2012-11-21 ENCOUNTER — Encounter (HOSPITAL_BASED_OUTPATIENT_CLINIC_OR_DEPARTMENT_OTHER): Payer: Self-pay | Admitting: Orthopedic Surgery

## 2013-06-12 ENCOUNTER — Other Ambulatory Visit (HOSPITAL_COMMUNITY): Payer: Self-pay | Admitting: Ophthalmology

## 2013-06-12 DIAGNOSIS — G629 Polyneuropathy, unspecified: Secondary | ICD-10-CM

## 2013-06-17 ENCOUNTER — Ambulatory Visit (HOSPITAL_COMMUNITY)
Admission: RE | Admit: 2013-06-17 | Discharge: 2013-06-17 | Disposition: A | Discharge status: Home / Self Care | Payer: Medicare Other | Source: Ambulatory Visit | Attending: Ophthalmology | Admitting: Ophthalmology

## 2013-06-17 ENCOUNTER — Encounter (HOSPITAL_COMMUNITY): Payer: Self-pay

## 2013-06-17 DIAGNOSIS — G319 Degenerative disease of nervous system, unspecified: Secondary | ICD-10-CM | POA: Insufficient documentation

## 2013-06-17 DIAGNOSIS — H5460 Unqualified visual loss, one eye, unspecified: Secondary | ICD-10-CM | POA: Insufficient documentation

## 2013-06-17 DIAGNOSIS — E119 Type 2 diabetes mellitus without complications: Secondary | ICD-10-CM | POA: Insufficient documentation

## 2013-06-17 DIAGNOSIS — I10 Essential (primary) hypertension: Secondary | ICD-10-CM | POA: Insufficient documentation

## 2013-06-17 DIAGNOSIS — G629 Polyneuropathy, unspecified: Secondary | ICD-10-CM

## 2013-06-17 MED ORDER — GADOBENATE DIMEGLUMINE 529 MG/ML IV SOLN
14.0000 mL | Freq: Once | INTRAVENOUS | Status: AC | PRN
  Administered 2013-06-17: 14 mL via INTRAVENOUS

## 2013-06-18 LAB — POCT I-STAT, CHEM 8
BUN: 8 mg/dL (ref 6–23)
Calcium, Ion: 1.19 mmol/L (ref 1.13–1.30)
Chloride: 95 mEq/L — ABNORMAL LOW (ref 96–112)
Creatinine, Ser: 0.9 mg/dL (ref 0.50–1.10)
Glucose, Bld: 126 mg/dL — ABNORMAL HIGH (ref 70–99)
HCT: 44 % (ref 36.0–46.0)
Hemoglobin: 15 g/dL (ref 12.0–15.0)
Potassium: 3.3 mEq/L — ABNORMAL LOW (ref 3.5–5.1)
Sodium: 132 mEq/L — ABNORMAL LOW (ref 135–145)
TCO2: 26 mmol/L (ref 0–100)

## 2013-08-17 ENCOUNTER — Encounter (HOSPITAL_COMMUNITY): Payer: Self-pay | Admitting: Emergency Medicine

## 2013-08-17 ENCOUNTER — Emergency Department (HOSPITAL_COMMUNITY)
Admission: EM | Admit: 2013-08-17 | Discharge: 2013-08-17 | Disposition: A | Discharge status: Home / Self Care | Payer: Medicare Other | Attending: Emergency Medicine | Admitting: Emergency Medicine

## 2013-08-17 DIAGNOSIS — E119 Type 2 diabetes mellitus without complications: Secondary | ICD-10-CM | POA: Insufficient documentation

## 2013-08-17 DIAGNOSIS — M129 Arthropathy, unspecified: Secondary | ICD-10-CM | POA: Insufficient documentation

## 2013-08-17 DIAGNOSIS — Z7982 Long term (current) use of aspirin: Secondary | ICD-10-CM | POA: Insufficient documentation

## 2013-08-17 DIAGNOSIS — Z79899 Other long term (current) drug therapy: Secondary | ICD-10-CM | POA: Insufficient documentation

## 2013-08-17 DIAGNOSIS — I10 Essential (primary) hypertension: Secondary | ICD-10-CM | POA: Insufficient documentation

## 2013-08-17 DIAGNOSIS — Z8719 Personal history of other diseases of the digestive system: Secondary | ICD-10-CM | POA: Insufficient documentation

## 2013-08-17 DIAGNOSIS — E785 Hyperlipidemia, unspecified: Secondary | ICD-10-CM | POA: Insufficient documentation

## 2013-08-17 DIAGNOSIS — Z791 Long term (current) use of non-steroidal anti-inflammatories (NSAID): Secondary | ICD-10-CM | POA: Insufficient documentation

## 2013-08-17 NOTE — ED Notes (Signed)
Beeped [phone].Fanta

## 2013-08-17 NOTE — ED Provider Notes (Signed)
CSN: [id number]     Arrival date & time 08/17/13  1191 History  This chart was scribed for Dagmar Hait, MD by Leone Payor, ED Scribe. This patient was seen in room [facility]04/[facility]04 and the patient's care was started 10:19 AM.    Chief Complaint  Patient presents with  . Hypertension    Patient is a 79 y.o. female presenting with hypertension. The history is provided by the patient. No language interpreter was used.  Hypertension This is a chronic problem. The current episode started 2 days ago. The problem occurs constantly. The problem has been gradually worsening. Associated symptoms include headaches. Pertinent negatives include no chest pain, no abdominal pain and no shortness of breath. Nothing aggravates the symptoms. The symptoms are relieved by medications. Terri Mccall has tried nothing for the symptoms. The treatment provided no relief.    HPI Comments: Terri Mccall is a 79 y.o. female who presents to the Emergency Department complaining of hypertension. Pt states Terri Mccall was awakened this morning with heart palpitations that Terri Mccall describes as pounding in the chest. Terri Mccall states this pounding lasted about 5-10 minutes. Terri Mccall reports checking her HR which was 104 and BP was around 190/92. Pt also reports having a mild HA upon waking this morning. Pt states Terri Mccall used to take atenolol at night but was taken off of the night time dose on 08/05/13 by Dr. Ouida Sills. Pt states Terri Mccall was also started on amlodipine Pt states Terri Mccall had an episode of low BP in her right eye which caused her to have vision problems. Dr. Ouida Sills was concerned that taking the atenolol at night would cause her to have another drop in her eye BP. Pt states that since Terri Mccall has stopped the night time dose, Terri Mccall has noticed her morning BP to being in the 170s systolic. Terri Mccall reports her day time BP has been normal. Terri Mccall denies new visual disturbances, dizziness, chest pain, SOB, nausea, vomiting, diarrhea, numbness or weakness in the BLE.   PCP Dr.  Ouida Sills Past Medical History  Diagnosis Date  . Hypertension   . Hyperlipidemia   . Diabetes mellitus     diet controlled  . Constipation   . Arthritis    Past Surgical History  Procedure Laterality Date  . Abdominal hysterectomy    . Tonsillectomy    . Back surgery  1998  . Appendectomy    . Colonoscopy  04/26/2012    Procedure: COLONOSCOPY;  Surgeon: Malissa Hippo, MD;  Location: [facility];  Service: Endoscopy;  Laterality: N/A;  830  . Trigger finger release  11/20/2012    Procedure: RELEASE TRIGGER FINGER/A-1 PULLEY;  Surgeon: Nicki Reaper, MD;  Location: [facility];  Service: Orthopedics;  Laterality: Right;  RELEASE A-1 PULLEY RIGHT INDEX FINGER   No family history on file. History  Substance Use Topics  . Smoking status: Never Smoker   . Smokeless tobacco: Not on file  . Alcohol Use: Yes     Comment: rare   OB History   Grav Para Term Preterm Abortions TAB SAB Ect Mult Living                 Review of Systems  Eyes: Negative for visual disturbance.  Respiratory: Negative for shortness of breath.   Cardiovascular: Negative for chest pain.  Gastrointestinal: Negative for nausea, vomiting, abdominal pain and diarrhea.  Neurological: Positive for headaches. Negative for dizziness, weakness and numbness.  All other systems reviewed and are negative.  Allergies  Bextra  Home Medications   Current Outpatient Rx  Name  Route  Sig  Dispense  Refill  . aspirin EC 81 MG tablet   Oral   Take 81 mg by mouth every evening.         Marland Kitchen atenolol (TENORMIN) 50 MG tablet   Oral   Take 50 mg by mouth 2 (two) times daily.         . beta carotene w/minerals (OCUVITE) tablet   Oral   Take 1 tablet by mouth every morning.         . chlorthalidone (HYGROTON) 25 MG tablet   Oral   Take 25 mg by mouth every morning.         . Cinnamon 500 MG capsule   Oral   Take 500 mg by mouth daily.         . Cranberry (SM CRANBERRY) 300 MG tablet    Oral   Take 300 mg by mouth every morning.         Marland Kitchen HYDROcodone-acetaminophen (NORCO) 5-325 MG per tablet   Oral   Take 1 tablet by mouth every 6 (six) hours as needed for pain.   30 tablet   0   . loratadine (CLARITIN) 10 MG tablet   Oral   Take 10 mg by mouth every morning.         . naproxen sodium (ANAPROX) 220 MG tablet   Oral   Take 220 mg by mouth 2 (two) times daily with a meal.         . olmesartan (BENICAR) 40 MG tablet   Oral   Take 40 mg by mouth every morning.         . potassium chloride SA (K-DUR,KLOR-CON) 20 MEQ tablet   Oral   Take 20 mEq by mouth every morning.         Marland Kitchen Propylene Glycol (SYSTANE BALANCE) 0.6 % SOLN   Ophthalmic   Apply 1 drop to eye 3 (three) times daily as needed. For dry eyes         . pyridOXINE (VITAMIN B-6) 100 MG tablet   Oral   Take 100 mg by mouth every morning.         . simvastatin (ZOCOR) 20 MG tablet   Oral   Take 20 mg by mouth every evening.         . thiamine (VITAMIN B-1) 100 MG tablet   Oral   Take 100 mg by mouth every morning.         . vitamin B-12 (CYANOCOBALAMIN) 1000 MCG tablet   Oral   Take 1,000 mcg by mouth every morning.          BP 182/62  Pulse 77  Temp(Src) 97.7 F (36.5 C) (Oral)  Resp 16  Ht 5' 0.5" (1.537 m)  Wt 150 lb (68.04 kg)  BMI 28.8 kg/m2  SpO2 97% Physical Exam  Nursing note and vitals reviewed. Constitutional: Terri Mccall is oriented to person, place, and time. Terri Mccall appears well-developed and well-nourished.  HENT:  Head: Normocephalic and atraumatic.  Eyes: Conjunctivae and EOM are normal. Pupils are equal, round, and reactive to light.  Neck: Normal range of motion. Neck supple.  Cardiovascular: Normal rate, regular rhythm, normal heart sounds and intact distal pulses.   Pulmonary/Chest: Effort normal and breath sounds normal. No respiratory distress. Terri Mccall has no wheezes. Terri Mccall has no rales. Terri Mccall exhibits no tenderness.  Abdominal: Soft. Bowel sounds are normal.  There is no tenderness.  Musculoskeletal: Normal range of motion. Terri Mccall exhibits no edema and no tenderness.  Neurological: Terri Mccall is alert and oriented to person, place, and time. Terri Mccall has normal strength and normal reflexes. Terri Mccall displays normal reflexes. No cranial nerve deficit or sensory deficit. Terri Mccall exhibits normal muscle tone. Coordination and gait normal.  Skin: Skin is warm and dry.  Psychiatric: Terri Mccall has a normal mood and affect.    ED Course  Procedures   DIAGNOSTIC STUDIES: Oxygen Saturation is 97% on RA, normal by my interpretation.    COORDINATION OF CARE: 10:18 AM Discussed treatment plan with pt at bedside and pt agreed to plan.    Labs Review Labs Reviewed - No data to display Imaging Review No results found.  EKG Interpretation   None       MDM   1. Hypertension    79 year old female with history of hypertension. Terri Mccall's awoken the past few mornings with elevated blood pressures in the 180s. Terri Mccall's recently stopped on her nighttime atenolol for concerns of nocturnal hypotension leading to vision problems. Terri Mccall stated some heart racing this morning, when Terri Mccall checked her blood pressure pulse is 104. Terri Mccall denied any chest pain, dizziness, shortness of breath, new vision problems, nausea, vomiting, diarrhea, abdominal pain. Terri Mccall denies any numbness or weakness in arms or legs. Terri Mccall denies any ataxia. Here Terri Mccall is doing very well, lisinopril 20. Pressures are in the 180s over 70s. Patient has normal neuro exam is normal gait, normal sensation, normal cranial nerves. Terri Mccall has clear lungs and heart sounds normal. Terri Mccall's not tachycardic, rate in the 70s on the monitor in sinus rhythm. Spoke with Dr. Felecia Shelling, who is on-call for PCP, Dr. Ouida Sills, about her problems. He recommended increasing amlodipine. Patient states Terri Mccall did once again noted pain more than Terri Mccall had to, so we discussed possibly returning her atenolol at night, does have the dose. He states it is a good idea to go to 25 mg of  atenolol at night, Terri Mccall was currently taking 50. He recommended followup next week. I do not feel like patient has hypertensive urgency or emergency at this time. We will observe her for a little while to monitor BPs. Terri Mccall did take her anti-hypertensives this morning. Repeat BPs much lower. Stable for discharge.   I personally performed the services described in this documentation, which was scribed in my presence. The recorded information has been reviewed and is accurate.     Dagmar Hait, MD 08/17/13 [phone]

## 2013-08-17 NOTE — ED Notes (Signed)
MD at bedside. 

## 2013-08-17 NOTE — ED Notes (Signed)
Pt left under shirt at d/c. Pt called at home. Voicemail left telling pt that undershirt was left and was placed in a bag and labeled.Garment in emergency department whenever she came to pick up garment.

## 2013-08-17 NOTE — ED Notes (Signed)
Pt states she was awakened this morning at 5am with the sound of her heart pounding, states she could also feel pounding in the chest and heart was racing. HR was 104 at the time. Denies pain at any time. Third day in row that this has occurred. BP meds recently changed. NAD at this time. BP of 182/62.

## 2013-11-02 ENCOUNTER — Encounter (HOSPITAL_COMMUNITY): Payer: Self-pay | Admitting: Emergency Medicine

## 2013-11-02 ENCOUNTER — Emergency Department (HOSPITAL_COMMUNITY)
Admission: EM | Admit: 2013-11-02 | Discharge: 2013-11-02 | Disposition: A | Discharge status: Home / Self Care | Payer: Medicare Other | Attending: Emergency Medicine | Admitting: Emergency Medicine

## 2013-11-02 ENCOUNTER — Emergency Department (HOSPITAL_COMMUNITY): Payer: Medicare Other

## 2013-11-02 DIAGNOSIS — R110 Nausea: Secondary | ICD-10-CM | POA: Insufficient documentation

## 2013-11-02 DIAGNOSIS — Z8719 Personal history of other diseases of the digestive system: Secondary | ICD-10-CM | POA: Insufficient documentation

## 2013-11-02 DIAGNOSIS — Z79899 Other long term (current) drug therapy: Secondary | ICD-10-CM | POA: Insufficient documentation

## 2013-11-02 DIAGNOSIS — Z7982 Long term (current) use of aspirin: Secondary | ICD-10-CM | POA: Insufficient documentation

## 2013-11-02 DIAGNOSIS — M129 Arthropathy, unspecified: Secondary | ICD-10-CM | POA: Insufficient documentation

## 2013-11-02 DIAGNOSIS — J069 Acute upper respiratory infection, unspecified: Secondary | ICD-10-CM

## 2013-11-02 DIAGNOSIS — Z791 Long term (current) use of non-steroidal anti-inflammatories (NSAID): Secondary | ICD-10-CM | POA: Insufficient documentation

## 2013-11-02 DIAGNOSIS — I10 Essential (primary) hypertension: Secondary | ICD-10-CM | POA: Insufficient documentation

## 2013-11-02 DIAGNOSIS — E785 Hyperlipidemia, unspecified: Secondary | ICD-10-CM | POA: Insufficient documentation

## 2013-11-02 DIAGNOSIS — E119 Type 2 diabetes mellitus without complications: Secondary | ICD-10-CM | POA: Insufficient documentation

## 2013-11-02 MED ORDER — ONDANSETRON HCL 8 MG PO TABS: 8.0000 mg | ORAL_TABLET | ORAL | Status: DC | PRN

## 2013-11-02 NOTE — ED Provider Notes (Signed)
CSN: [id number]     Arrival date & time 11/02/13  1038 History   None    Chief Complaint  Patient presents with  . Cough   (Consider location/radiation/quality/duration/timing/severity/associated sxs/prior Treatment) HPI.... cough with clear sputum since Wednesday with associated nausea. Patient has tried over-the-counter products with moderate relief. No fever, sweats, chills. Nonsmoker. She is feeling better. Severity is mild  HPI Comments: Terri Mccall is a 79 y.o. female  Past Medical History  Diagnosis Date  . Hypertension   . Hyperlipidemia   . Diabetes mellitus     diet controlled  . Constipation   . Arthritis    Past Surgical History  Procedure Laterality Date  . Abdominal hysterectomy    . Tonsillectomy    . Back surgery  1998  . Appendectomy    . Colonoscopy  04/26/2012    Procedure: COLONOSCOPY;  Surgeon: Malissa Hippo, MD;  Location: [facility];  Service: Endoscopy;  Laterality: N/A;  830  . Trigger finger release  11/20/2012    Procedure: RELEASE TRIGGER FINGER/A-1 PULLEY;  Surgeon: Nicki Reaper, MD;  Location: [facility];  Service: Orthopedics;  Laterality: Right;  RELEASE A-1 PULLEY RIGHT INDEX FINGER   No family history on file. History  Substance Use Topics  . Smoking status: Never Smoker   . Smokeless tobacco: Not on file  . Alcohol Use: Yes     Comment: rare   OB History   Grav Para Term Preterm Abortions TAB SAB Ect Mult Living                 Review of Systems  All other systems reviewed and are negative.    Allergies  Bextra  Home Medications   Current Outpatient Rx  Name  Route  Sig  Dispense  Refill  . amLODipine (NORVASC) 5 MG tablet   Oral   Take 5 mg by mouth daily.         Marland Kitchen aspirin EC 81 MG tablet   Oral   Take 81 mg by mouth every evening.         Marland Kitchen atenolol (TENORMIN) 50 MG tablet   Oral   Take 50 mg by mouth 2 (two) times daily.         . beta carotene w/minerals (OCUVITE) tablet   Oral  Take 1 tablet by mouth every morning.         . chlorthalidone (HYGROTON) 25 MG tablet   Oral   Take 25 mg by mouth every morning.         . Cinnamon 500 MG capsule   Oral   Take 1,000 mg by mouth daily.         . Cranberry (SM CRANBERRY) 300 MG tablet   Oral   Take 300 mg by mouth every morning.         Marland Kitchen glucosamine-chondroitin 500-400 MG tablet   Oral   Take 1 tablet by mouth 3 (three) times daily.         Marland Kitchen latanoprost (XALATAN) 0.005 % ophthalmic solution   Both Eyes   Place 1 drop into both eyes at bedtime.         Marland Kitchen loratadine (CLARITIN) 10 MG tablet   Oral   Take 10 mg by mouth every morning.         . naproxen sodium (ANAPROX) 220 MG tablet   Oral   Take 220 mg by mouth 2 (two) times daily with a  meal.         . olmesartan (BENICAR) 40 MG tablet   Oral   Take 40 mg by mouth every morning.         . potassium chloride SA (K-DUR,KLOR-CON) 20 MEQ tablet   Oral   Take 20 mEq by mouth every morning.         Marland Kitchen PREMARIN vaginal cream   Vaginal   Place 1 Applicatorful vaginally once a week.         Marland Kitchen Propylene Glycol (SYSTANE BALANCE) 0.6 % SOLN   Ophthalmic   Apply 1 drop to eye 3 (three) times daily as needed. For dry eyes         . pyridOXINE (VITAMIN B-6) 100 MG tablet   Oral   Take 100 mg by mouth every morning.         . simvastatin (ZOCOR) 20 MG tablet   Oral   Take 20 mg by mouth every evening.         . thiamine (VITAMIN B-1) 100 MG tablet   Oral   Take 100 mg by mouth every morning.         . vitamin B-12 (CYANOCOBALAMIN) 1000 MCG tablet   Oral   Take 1,000 mcg by mouth every morning.          BP 181/53  Pulse 66  Temp(Src) 98.2 F (36.8 C) (Oral)  Resp 18  SpO2 97% Physical Exam  Nursing note and vitals reviewed. Constitutional: She is oriented to person, place, and time. She appears well-developed and well-nourished.  HENT:  Head: Normocephalic and atraumatic.  Eyes: Conjunctivae and EOM are normal.  Pupils are equal, round, and reactive to light.  Neck: Normal range of motion. Neck supple.  Cardiovascular: Normal rate, regular rhythm and normal heart sounds.   Pulmonary/Chest: Effort normal and breath sounds normal.  Abdominal: Soft. Bowel sounds are normal.  Musculoskeletal: Normal range of motion.  Neurological: She is alert and oriented to person, place, and time.  Skin: Skin is warm and dry.  Psychiatric: She has a normal mood and affect. Her behavior is normal.    ED Course  Procedures (including critical care time) Labs Review Labs Reviewed - No data to display Imaging Review Dg Chest 2 View  11/02/2013   CLINICAL DATA:  Cough and congestion.  EXAM: CHEST  2 VIEW  COMPARISON:  None.  FINDINGS: The lungs are mildly hyperinflated. There is no focal infiltrate. The interstitial markings are somewhat coarse inferiorly. There is no pleural effusion or pneumothorax. The cardiopericardial silhouette is normal in size. The pulmonary vascularity is not engorged. The observed portions of the bony thorax appear normal for age. Mild degenerative disc changes noted at multiple levels.  IMPRESSION: There is mild hyperinflation consistent with COPD. I cannot exclude acute bronchitis in the appropriate clinical setting. There is no evidence of pneumonia nor CHF.   Electronically Signed   By: David  [place]   On: 11/02/2013 12:14    EKG Interpretation   None       MDM  No diagnosis found.   History and physical consistent with viral syndrome. Chest x-ray negative. Supportive care only.    Donnetta Hutching, MD 11/02/13 1341

## 2013-11-02 NOTE — ED Notes (Signed)
Detailed discharge instructions given to the pt. Verbalized understanding of all, 1 script given. Walked out unaided.

## 2013-11-02 NOTE — ED Notes (Signed)
Pt c/o nausea today.

## 2013-11-02 NOTE — ED Notes (Signed)
Pt c/o cough since Thursday.  Reports productive at times, denies fever.

## 2014-09-09 ENCOUNTER — Encounter: Payer: Self-pay | Admitting: Podiatry

## 2014-09-09 ENCOUNTER — Ambulatory Visit (INDEPENDENT_AMBULATORY_CARE_PROVIDER_SITE_OTHER): Payer: Medicare Other | Admitting: Podiatry

## 2014-09-09 ENCOUNTER — Ambulatory Visit (INDEPENDENT_AMBULATORY_CARE_PROVIDER_SITE_OTHER): Payer: Medicare Other

## 2014-09-09 VITALS — BP 186/87 | HR 75 | Resp 16 | Ht 60.0 in | Wt 150.0 lb

## 2014-09-09 DIAGNOSIS — M84374A Stress fracture, right foot, initial encounter for fracture: Secondary | ICD-10-CM

## 2014-09-09 DIAGNOSIS — M779 Enthesopathy, unspecified: Secondary | ICD-10-CM

## 2014-09-09 NOTE — Progress Notes (Signed)
   Subjective:    Patient ID: Terri Mccall, female    DOB: January 20, 1934, 80 y.o.   MRN: [id number]  HPI Comments: "I got soreness in my foot"  Patient c/o tenderness arch, medial, and some dorsal right foot for about 10 days. She has swelling. She has AM pain and it pops. She has tried heating pad and naproxen-some relief.  Foot Pain Associated symptoms include arthralgias.      Review of Systems  Eyes: Positive for visual disturbance.  Musculoskeletal: Positive for back pain and arthralgias.  All other systems reviewed and are negative.      Objective:   Physical Exam        Assessment & Plan:

## 2014-09-09 NOTE — Progress Notes (Signed)
Subjective:     Patient ID: Terri Mccall, female   DOB: 03-16-1934, 80 y.o.   MRN: [id number]  HPIpatient presents stating about 10 days ago that she started to develop pain in the dorsum of her right foot and it has become swollen and feels almost like she's walking on a broken bone. States that she has tried treatment options of reduced activity which has not been successful   Review of Systems  All other systems reviewed and are negative.      Objective:   Physical Exam  Constitutional: She is oriented to person, place, and time.  Cardiovascular: Intact distal pulses.   Musculoskeletal: Normal range of motion.  Neurological: She is oriented to person, place, and time.  Skin: Skin is warm.  Nursing note and vitals reviewed. neurovascular status intact with muscle strength adequate and range of motion subtalar and midtarsal joint within normal limits. Patient has moderate forefoot edema that is occurring with significant discomfort around the second metatarsal shaft right and is noted to have a negative Homans sign with mild edema in the ankle also noted. No plantar pain was noted or other pathology     Assessment:     Probable stress fracture of the second metatarsal right    Plan:     H&P and x-rays reviewed and today applied short air fracture walker with instructions on reduced activity. Patient will be reevaluated again in the next few weeks to see how she responded

## 2014-09-29 ENCOUNTER — Ambulatory Visit (INDEPENDENT_AMBULATORY_CARE_PROVIDER_SITE_OTHER): Payer: Medicare Other | Admitting: Podiatry

## 2014-09-29 ENCOUNTER — Ambulatory Visit (INDEPENDENT_AMBULATORY_CARE_PROVIDER_SITE_OTHER): Payer: Medicare Other

## 2014-09-29 ENCOUNTER — Encounter: Payer: Self-pay | Admitting: Podiatry

## 2014-09-29 VITALS — BP 151/93 | HR 73 | Resp 16

## 2014-09-29 DIAGNOSIS — M8430XD Stress fracture, unspecified site, subsequent encounter for fracture with routine healing: Secondary | ICD-10-CM

## 2014-09-29 NOTE — Progress Notes (Signed)
Subjective:     Patient ID: Terri Mccall, female   DOB: 01-10-1934, 80 y.o.   MRN: [id number]  HPI patient states the bone is doing pretty well but still sore and I do have some trouble wearing the boot but it really helps to control my pain   Review of Systems     Objective:   Physical Exam Neurovascular status intact with muscle strength adequate and range of motion within normal limits. Continued moderate forefoot pain right with pain in the second metatarsal shaft with +1 pitting edema in the foot. Negative for Homan sign at this time    Assessment:     Probable stress fracture right that's healing but still inflamed    Plan:     Advised on continued immobilization and that this will probably take 4-6 months to heal. Patient was instructed on ice compression and elevation along with gradual reduction and boot usage and reappoint in 4-6 weeks if symptoms persist

## 2014-11-24 DIAGNOSIS — H47011 Ischemic optic neuropathy, right eye: Secondary | ICD-10-CM | POA: Diagnosis not present

## 2014-12-16 DIAGNOSIS — E119 Type 2 diabetes mellitus without complications: Secondary | ICD-10-CM | POA: Diagnosis not present

## 2014-12-23 DIAGNOSIS — I10 Essential (primary) hypertension: Secondary | ICD-10-CM | POA: Diagnosis not present

## 2014-12-23 DIAGNOSIS — E119 Type 2 diabetes mellitus without complications: Secondary | ICD-10-CM | POA: Diagnosis not present

## 2015-01-27 DIAGNOSIS — H34831 Tributary (branch) retinal vein occlusion, right eye: Secondary | ICD-10-CM | POA: Diagnosis not present

## 2015-01-27 DIAGNOSIS — E11329 Type 2 diabetes mellitus with mild nonproliferative diabetic retinopathy without macular edema: Secondary | ICD-10-CM | POA: Diagnosis not present

## 2015-01-27 DIAGNOSIS — H43813 Vitreous degeneration, bilateral: Secondary | ICD-10-CM | POA: Diagnosis not present

## 2015-03-11 DIAGNOSIS — Z87440 Personal history of urinary (tract) infections: Secondary | ICD-10-CM | POA: Diagnosis not present

## 2015-04-14 DIAGNOSIS — E119 Type 2 diabetes mellitus without complications: Secondary | ICD-10-CM | POA: Diagnosis not present

## 2015-04-21 DIAGNOSIS — I10 Essential (primary) hypertension: Secondary | ICD-10-CM | POA: Diagnosis not present

## 2015-04-21 DIAGNOSIS — Z87440 Personal history of urinary (tract) infections: Secondary | ICD-10-CM | POA: Diagnosis not present

## 2015-04-21 DIAGNOSIS — E1139 Type 2 diabetes mellitus with other diabetic ophthalmic complication: Secondary | ICD-10-CM | POA: Diagnosis not present

## 2015-05-25 DIAGNOSIS — H2513 Age-related nuclear cataract, bilateral: Secondary | ICD-10-CM | POA: Diagnosis not present

## 2015-06-02 DIAGNOSIS — Z1231 Encounter for screening mammogram for malignant neoplasm of breast: Secondary | ICD-10-CM | POA: Diagnosis not present

## 2015-08-25 DIAGNOSIS — Z79899 Other long term (current) drug therapy: Secondary | ICD-10-CM | POA: Diagnosis not present

## 2015-08-25 DIAGNOSIS — E875 Hyperkalemia: Secondary | ICD-10-CM | POA: Diagnosis not present

## 2015-08-25 DIAGNOSIS — E785 Hyperlipidemia, unspecified: Secondary | ICD-10-CM | POA: Diagnosis not present

## 2015-08-25 DIAGNOSIS — E119 Type 2 diabetes mellitus without complications: Secondary | ICD-10-CM | POA: Diagnosis not present

## 2015-09-01 DIAGNOSIS — Z6829 Body mass index (BMI) 29.0-29.9, adult: Secondary | ICD-10-CM | POA: Diagnosis not present

## 2015-09-01 DIAGNOSIS — Z0001 Encounter for general adult medical examination with abnormal findings: Secondary | ICD-10-CM | POA: Diagnosis not present

## 2015-09-01 DIAGNOSIS — E1139 Type 2 diabetes mellitus with other diabetic ophthalmic complication: Secondary | ICD-10-CM | POA: Diagnosis not present

## 2015-09-01 DIAGNOSIS — E785 Hyperlipidemia, unspecified: Secondary | ICD-10-CM | POA: Diagnosis not present

## 2015-09-01 DIAGNOSIS — I10 Essential (primary) hypertension: Secondary | ICD-10-CM | POA: Diagnosis not present

## 2015-09-04 ENCOUNTER — Other Ambulatory Visit (HOSPITAL_COMMUNITY): Payer: Self-pay | Admitting: Internal Medicine

## 2015-09-04 DIAGNOSIS — Z780 Asymptomatic menopausal state: Secondary | ICD-10-CM

## 2015-09-10 ENCOUNTER — Ambulatory Visit (HOSPITAL_COMMUNITY)
Admission: RE | Admit: 2015-09-10 | Discharge: 2015-09-10 | Disposition: A | Discharge status: Home / Self Care | Payer: Medicare Other | Source: Ambulatory Visit | Attending: Internal Medicine | Admitting: Internal Medicine

## 2015-09-10 DIAGNOSIS — Z780 Asymptomatic menopausal state: Secondary | ICD-10-CM | POA: Diagnosis not present

## 2015-09-10 DIAGNOSIS — M85852 Other specified disorders of bone density and structure, left thigh: Secondary | ICD-10-CM | POA: Diagnosis not present

## 2015-10-13 DIAGNOSIS — H348312 Tributary (branch) retinal vein occlusion, right eye, stable: Secondary | ICD-10-CM | POA: Diagnosis not present

## 2015-10-13 DIAGNOSIS — E113293 Type 2 diabetes mellitus with mild nonproliferative diabetic retinopathy without macular edema, bilateral: Secondary | ICD-10-CM | POA: Diagnosis not present

## 2015-11-07 DIAGNOSIS — N390 Urinary tract infection, site not specified: Secondary | ICD-10-CM | POA: Diagnosis not present

## 2015-11-11 ENCOUNTER — Ambulatory Visit (INDEPENDENT_AMBULATORY_CARE_PROVIDER_SITE_OTHER): Payer: Medicare Other | Admitting: Podiatry

## 2015-11-11 ENCOUNTER — Encounter: Payer: Self-pay | Admitting: Podiatry

## 2015-11-11 DIAGNOSIS — M2041 Other hammer toe(s) (acquired), right foot: Secondary | ICD-10-CM | POA: Diagnosis not present

## 2015-11-11 DIAGNOSIS — L84 Corns and callosities: Secondary | ICD-10-CM

## 2015-11-16 NOTE — Progress Notes (Signed)
Subjective:     Patient ID: Terri Mccall, female   DOB: [birth year], 82 y.o.   MRN: [id number]  HPI patient presents with painful fourth toe right stating that there is a lesion there and that she like to get a cut and that she no she might need surgery someday   Review of Systems     Objective:   Physical Exam  neurovascular status intact muscle strength was adequate with keratotic lesion lateral side fourth toe that upon evaluation is thick with bony exposure and also lesion on the fifth toe right    Assessment:      lesion secondary to bony encroachment of the fourth and fifth toes with rotation of the fifth toe is a complicating factor    Plan:      H&P and condition and x-ray reviewed with patient. Today debridement and padding applied and instructed on possibility for surgery depending on how this thing response

## 2015-11-23 DIAGNOSIS — H538 Other visual disturbances: Secondary | ICD-10-CM | POA: Diagnosis not present

## 2015-12-02 DIAGNOSIS — H47011 Ischemic optic neuropathy, right eye: Secondary | ICD-10-CM | POA: Diagnosis not present

## 2015-12-28 ENCOUNTER — Ambulatory Visit (INDEPENDENT_AMBULATORY_CARE_PROVIDER_SITE_OTHER): Payer: Medicare Other | Admitting: Podiatry

## 2015-12-28 DIAGNOSIS — M2041 Other hammer toe(s) (acquired), right foot: Secondary | ICD-10-CM | POA: Diagnosis not present

## 2015-12-28 DIAGNOSIS — E119 Type 2 diabetes mellitus without complications: Secondary | ICD-10-CM | POA: Diagnosis not present

## 2015-12-28 DIAGNOSIS — L84 Corns and callosities: Secondary | ICD-10-CM

## 2015-12-29 NOTE — Progress Notes (Signed)
Subjective:     Patient ID: Terri Mccall, female   DOB: [birth year], 82 y.o.   MRN: [id number]  HPI patient presents stating I'm having a lot of pain between the fourth and fifth toes on my right foot and I do have a daughter getting married in May   Review of Systems     Objective:   Physical Exam  neurovascular status intact muscle strength adequate with exquisite discomfort between the fourth and fifth digits right foot with keratotic lesion formation on the distal fifth right and the inner side of the fourth toe right that are very painful when pressed    Assessment:      hammertoe deformity and exostosis fifth and fourth toe right with rotation of the toe noted    Plan:      reviewed condition and discussed long-term exostectomy arthroplasty but she wants to wait after the wedding. Today I debrided the lesions which was tolerated well and reappoint in 6 weeks and continue to use padding

## 2016-01-04 DIAGNOSIS — I10 Essential (primary) hypertension: Secondary | ICD-10-CM | POA: Diagnosis not present

## 2016-01-04 DIAGNOSIS — E119 Type 2 diabetes mellitus without complications: Secondary | ICD-10-CM | POA: Diagnosis not present

## 2016-01-15 DIAGNOSIS — L570 Actinic keratosis: Secondary | ICD-10-CM | POA: Diagnosis not present

## 2016-01-15 DIAGNOSIS — D225 Melanocytic nevi of trunk: Secondary | ICD-10-CM | POA: Diagnosis not present

## 2016-01-15 DIAGNOSIS — L821 Other seborrheic keratosis: Secondary | ICD-10-CM | POA: Diagnosis not present

## 2016-01-15 DIAGNOSIS — L820 Inflamed seborrheic keratosis: Secondary | ICD-10-CM | POA: Diagnosis not present

## 2016-01-15 DIAGNOSIS — L218 Other seborrheic dermatitis: Secondary | ICD-10-CM | POA: Diagnosis not present

## 2016-02-08 ENCOUNTER — Ambulatory Visit: Payer: Medicare Other | Admitting: Podiatry

## 2016-02-08 DIAGNOSIS — H52203 Unspecified astigmatism, bilateral: Secondary | ICD-10-CM | POA: Diagnosis not present

## 2016-02-08 DIAGNOSIS — H401111 Primary open-angle glaucoma, right eye, mild stage: Secondary | ICD-10-CM | POA: Diagnosis not present

## 2016-02-08 DIAGNOSIS — H401122 Primary open-angle glaucoma, left eye, moderate stage: Secondary | ICD-10-CM | POA: Diagnosis not present

## 2016-02-08 DIAGNOSIS — H2513 Age-related nuclear cataract, bilateral: Secondary | ICD-10-CM | POA: Diagnosis not present

## 2016-03-14 ENCOUNTER — Ambulatory Visit: Payer: Medicare Other | Admitting: Podiatry

## 2016-03-17 ENCOUNTER — Ambulatory Visit (INDEPENDENT_AMBULATORY_CARE_PROVIDER_SITE_OTHER): Payer: Medicare Other | Admitting: Podiatry

## 2016-03-17 ENCOUNTER — Encounter: Payer: Self-pay | Admitting: Podiatry

## 2016-03-17 ENCOUNTER — Ambulatory Visit (INDEPENDENT_AMBULATORY_CARE_PROVIDER_SITE_OTHER): Payer: Medicare Other

## 2016-03-17 DIAGNOSIS — M79671 Pain in right foot: Secondary | ICD-10-CM

## 2016-03-17 DIAGNOSIS — L84 Corns and callosities: Secondary | ICD-10-CM | POA: Diagnosis not present

## 2016-03-17 NOTE — Progress Notes (Signed)
Subjective:     Patient ID: Terri Mccall, female   DOB: 09-15-1934, 82 y.o.   MRN: [id number]  HPI patient presents with lesions plantar aspect and lateral side right fifth toe and fourth toe along with edema in the right foot with history of possible injury or fracture   Review of Systems     Objective:   Physical Exam Neurovascular status intact muscle strength adequate with keratotic lesions fourth fifth digits right that are painful when pressed and edema in the right forefoot    Assessment:     Possible stress fracture fracture right foot along with chronic exostosis hammertoe deformity with lesion formation    Plan:     X-ray reviewed with patient and debrided lesions right foot and dispensed Ace wrap with instructions on elevation compression. Reappoint as needed and may ultimately require surgery on these chronic   X-ray indicated no signs stress fracture fracture of the right foot

## 2016-05-05 DIAGNOSIS — E119 Type 2 diabetes mellitus without complications: Secondary | ICD-10-CM | POA: Diagnosis not present

## 2016-05-19 ENCOUNTER — Other Ambulatory Visit (HOSPITAL_COMMUNITY): Payer: Self-pay | Admitting: Internal Medicine

## 2016-05-19 ENCOUNTER — Ambulatory Visit (HOSPITAL_COMMUNITY)
Admission: RE | Admit: 2016-05-19 | Discharge: 2016-05-19 | Disposition: A | Discharge status: Home / Self Care | Payer: Medicare Other | Source: Ambulatory Visit | Attending: Internal Medicine | Admitting: Internal Medicine

## 2016-05-19 DIAGNOSIS — R05 Cough: Secondary | ICD-10-CM

## 2016-05-19 DIAGNOSIS — R059 Cough, unspecified: Secondary | ICD-10-CM

## 2016-05-19 DIAGNOSIS — E119 Type 2 diabetes mellitus without complications: Secondary | ICD-10-CM | POA: Diagnosis not present

## 2016-05-19 DIAGNOSIS — I10 Essential (primary) hypertension: Secondary | ICD-10-CM | POA: Diagnosis not present

## 2016-05-23 ENCOUNTER — Other Ambulatory Visit (HOSPITAL_COMMUNITY): Payer: Self-pay | Admitting: Internal Medicine

## 2016-05-23 DIAGNOSIS — R269 Unspecified abnormalities of gait and mobility: Secondary | ICD-10-CM

## 2016-06-03 ENCOUNTER — Ambulatory Visit (HOSPITAL_COMMUNITY)
Admission: RE | Admit: 2016-06-03 | Discharge: 2016-06-03 | Disposition: A | Discharge status: Home / Self Care | Payer: Medicare Other | Source: Ambulatory Visit | Attending: Internal Medicine | Admitting: Internal Medicine

## 2016-06-03 DIAGNOSIS — G319 Degenerative disease of nervous system, unspecified: Secondary | ICD-10-CM | POA: Insufficient documentation

## 2016-06-03 DIAGNOSIS — R9082 White matter disease, unspecified: Secondary | ICD-10-CM | POA: Insufficient documentation

## 2016-06-03 DIAGNOSIS — R269 Unspecified abnormalities of gait and mobility: Secondary | ICD-10-CM | POA: Insufficient documentation

## 2016-06-03 DIAGNOSIS — R2689 Other abnormalities of gait and mobility: Secondary | ICD-10-CM | POA: Diagnosis not present

## 2016-06-07 DIAGNOSIS — Z1231 Encounter for screening mammogram for malignant neoplasm of breast: Secondary | ICD-10-CM | POA: Diagnosis not present

## 2016-08-15 DIAGNOSIS — H2513 Age-related nuclear cataract, bilateral: Secondary | ICD-10-CM | POA: Diagnosis not present

## 2016-08-15 DIAGNOSIS — H401122 Primary open-angle glaucoma, left eye, moderate stage: Secondary | ICD-10-CM | POA: Diagnosis not present

## 2016-08-15 DIAGNOSIS — H401111 Primary open-angle glaucoma, right eye, mild stage: Secondary | ICD-10-CM | POA: Diagnosis not present

## 2016-08-15 DIAGNOSIS — E113293 Type 2 diabetes mellitus with mild nonproliferative diabetic retinopathy without macular edema, bilateral: Secondary | ICD-10-CM | POA: Diagnosis not present

## 2016-09-15 DIAGNOSIS — E119 Type 2 diabetes mellitus without complications: Secondary | ICD-10-CM | POA: Diagnosis not present

## 2016-09-15 DIAGNOSIS — E876 Hypokalemia: Secondary | ICD-10-CM | POA: Diagnosis not present

## 2016-09-15 DIAGNOSIS — E785 Hyperlipidemia, unspecified: Secondary | ICD-10-CM | POA: Diagnosis not present

## 2016-09-15 DIAGNOSIS — Z79899 Other long term (current) drug therapy: Secondary | ICD-10-CM | POA: Diagnosis not present

## 2016-09-22 DIAGNOSIS — Z23 Encounter for immunization: Secondary | ICD-10-CM | POA: Diagnosis not present

## 2016-09-22 DIAGNOSIS — E119 Type 2 diabetes mellitus without complications: Secondary | ICD-10-CM | POA: Diagnosis not present

## 2016-09-22 DIAGNOSIS — E785 Hyperlipidemia, unspecified: Secondary | ICD-10-CM | POA: Diagnosis not present

## 2016-09-22 DIAGNOSIS — I10 Essential (primary) hypertension: Secondary | ICD-10-CM | POA: Diagnosis not present

## 2016-09-27 ENCOUNTER — Emergency Department (HOSPITAL_COMMUNITY): Payer: Medicare Other

## 2016-09-27 ENCOUNTER — Inpatient Hospital Stay (HOSPITAL_COMMUNITY): Payer: Medicare Other | Admitting: Anesthesiology

## 2016-09-27 ENCOUNTER — Encounter (HOSPITAL_COMMUNITY): Payer: Self-pay | Admitting: Emergency Medicine

## 2016-09-27 ENCOUNTER — Inpatient Hospital Stay (HOSPITAL_COMMUNITY)
Admission: EM | Admit: 2016-09-27 | Discharge: 2016-09-29 | DRG: 417 | Disposition: A | Discharge status: Home / Self Care | Payer: Medicare Other | Attending: Internal Medicine | Admitting: Internal Medicine

## 2016-09-27 ENCOUNTER — Encounter (HOSPITAL_COMMUNITY)
Admission: EM | Disposition: A | Discharge status: Home / Self Care | Payer: Self-pay | Source: Home / Self Care | Attending: Internal Medicine

## 2016-09-27 DIAGNOSIS — E871 Hypo-osmolality and hyponatremia: Secondary | ICD-10-CM | POA: Diagnosis not present

## 2016-09-27 DIAGNOSIS — K8000 Calculus of gallbladder with acute cholecystitis without obstruction: Secondary | ICD-10-CM | POA: Diagnosis not present

## 2016-09-27 DIAGNOSIS — R0789 Other chest pain: Secondary | ICD-10-CM | POA: Diagnosis not present

## 2016-09-27 DIAGNOSIS — J189 Pneumonia, unspecified organism: Secondary | ICD-10-CM | POA: Diagnosis present

## 2016-09-27 DIAGNOSIS — K819 Cholecystitis, unspecified: Secondary | ICD-10-CM | POA: Diagnosis present

## 2016-09-27 DIAGNOSIS — N644 Mastodynia: Secondary | ICD-10-CM | POA: Diagnosis not present

## 2016-09-27 DIAGNOSIS — Z9889 Other specified postprocedural states: Secondary | ICD-10-CM | POA: Diagnosis not present

## 2016-09-27 DIAGNOSIS — E785 Hyperlipidemia, unspecified: Secondary | ICD-10-CM | POA: Diagnosis not present

## 2016-09-27 DIAGNOSIS — I10 Essential (primary) hypertension: Secondary | ICD-10-CM | POA: Diagnosis not present

## 2016-09-27 DIAGNOSIS — E1100 Type 2 diabetes mellitus with hyperosmolarity without nonketotic hyperglycemic-hyperosmolar coma (NKHHC): Secondary | ICD-10-CM

## 2016-09-27 DIAGNOSIS — R079 Chest pain, unspecified: Secondary | ICD-10-CM | POA: Diagnosis not present

## 2016-09-27 DIAGNOSIS — Z888 Allergy status to other drugs, medicaments and biological substances status: Secondary | ICD-10-CM | POA: Diagnosis not present

## 2016-09-27 DIAGNOSIS — K5900 Constipation, unspecified: Secondary | ICD-10-CM | POA: Diagnosis present

## 2016-09-27 DIAGNOSIS — Z79899 Other long term (current) drug therapy: Secondary | ICD-10-CM | POA: Diagnosis not present

## 2016-09-27 DIAGNOSIS — E7800 Pure hypercholesterolemia, unspecified: Secondary | ICD-10-CM | POA: Diagnosis not present

## 2016-09-27 DIAGNOSIS — H4089 Other specified glaucoma: Secondary | ICD-10-CM | POA: Diagnosis present

## 2016-09-27 DIAGNOSIS — Z7982 Long term (current) use of aspirin: Secondary | ICD-10-CM | POA: Diagnosis not present

## 2016-09-27 DIAGNOSIS — E876 Hypokalemia: Secondary | ICD-10-CM | POA: Diagnosis present

## 2016-09-27 DIAGNOSIS — E119 Type 2 diabetes mellitus without complications: Secondary | ICD-10-CM | POA: Diagnosis not present

## 2016-09-27 DIAGNOSIS — K8020 Calculus of gallbladder without cholecystitis without obstruction: Secondary | ICD-10-CM | POA: Diagnosis not present

## 2016-09-27 DIAGNOSIS — R911 Solitary pulmonary nodule: Secondary | ICD-10-CM | POA: Diagnosis present

## 2016-09-27 DIAGNOSIS — K8012 Calculus of gallbladder with acute and chronic cholecystitis without obstruction: Secondary | ICD-10-CM | POA: Diagnosis not present

## 2016-09-27 DIAGNOSIS — K810 Acute cholecystitis: Secondary | ICD-10-CM | POA: Diagnosis not present

## 2016-09-27 HISTORY — PX: CHOLECYSTECTOMY: SHX55

## 2016-09-27 LAB — GLUCOSE, CAPILLARY
GLUCOSE-CAPILLARY: 166 mg/dL — AB (ref 65–99)
GLUCOSE-CAPILLARY: 169 mg/dL — AB (ref 65–99)
GLUCOSE-CAPILLARY: 194 mg/dL — AB (ref 65–99)
Glucose-Capillary: 134 mg/dL — ABNORMAL HIGH (ref 65–99)
Glucose-Capillary: 169 mg/dL — ABNORMAL HIGH (ref 65–99)

## 2016-09-27 LAB — BASIC METABOLIC PANEL
ANION GAP: 12 (ref 5–15)
BUN: 9 mg/dL (ref 6–20)
CHLORIDE: 88 mmol/L — AB (ref 101–111)
CO2: 27 mmol/L (ref 22–32)
Calcium: 9.4 mg/dL (ref 8.9–10.3)
Creatinine, Ser: 0.61 mg/dL (ref 0.44–1.00)
GFR calc Af Amer: >60 mL/min (ref >60)
GFR calc non Af Amer: >60 mL/min (ref >60)
GLUCOSE: 159 mg/dL — AB (ref 65–99)
POTASSIUM: 2.9 mmol/L — AB (ref 3.5–5.1)
Sodium: 127 mmol/L — ABNORMAL LOW (ref 135–145)

## 2016-09-27 LAB — CBC WITH DIFFERENTIAL/PLATELET
BASOS ABS: 0 10*3/uL (ref 0.0–0.1)
Basophils Relative: 0 %
EOS PCT: 2 %
Eosinophils Absolute: 0.2 10*3/uL (ref 0.0–0.7)
HEMATOCRIT: 40.5 % (ref 36.0–46.0)
HEMOGLOBIN: 14.4 g/dL (ref 12.0–15.0)
LYMPHS ABS: 1.9 10*3/uL (ref 0.7–4.0)
LYMPHS PCT: 16 %
MCH: 29.1 pg (ref 26.0–34.0)
MCHC: 35.6 g/dL (ref 30.0–36.0)
MCV: 82 fL (ref 78.0–100.0)
Monocytes Absolute: 1 10*3/uL (ref 0.1–1.0)
Monocytes Relative: 8 %
NEUTROS ABS: 9.3 10*3/uL — AB (ref 1.7–7.7)
NEUTROS PCT: 74 %
Platelets: 226 10*3/uL (ref 150–400)
RBC: 4.94 MIL/uL (ref 3.87–5.11)
RDW: 13.5 % (ref 11.5–15.5)
WBC: 12.4 10*3/uL — AB (ref 4.0–10.5)

## 2016-09-27 LAB — TROPONIN I
Troponin I: <0.03 ng/mL (ref <0.03)
Troponin I: <0.03 ng/mL (ref <0.03)

## 2016-09-27 LAB — PROTIME-INR
INR: 0.99
PROTHROMBIN TIME: 13.1 s (ref 11.4–15.2)

## 2016-09-27 LAB — HEPATIC FUNCTION PANEL
ALBUMIN: 4.5 g/dL (ref 3.5–5.0)
ALK PHOS: 65 U/L (ref 38–126)
ALT: 18 U/L (ref 14–54)
AST: 28 U/L (ref 15–41)
BILIRUBIN TOTAL: 0.9 mg/dL (ref 0.3–1.2)
Bilirubin, Direct: 0.1 mg/dL (ref 0.1–0.5)
Indirect Bilirubin: 0.8 mg/dL (ref 0.3–0.9)
TOTAL PROTEIN: 8.4 g/dL — AB (ref 6.5–8.1)

## 2016-09-27 LAB — URIC ACID: URIC ACID, SERUM: 3 mg/dL (ref 2.3–6.6)

## 2016-09-27 LAB — LIPASE, BLOOD: Lipase: 19 U/L (ref 11–51)

## 2016-09-27 LAB — TYPE AND SCREEN
ABO/RH(D): O POS
Antibody Screen: NEGATIVE

## 2016-09-27 LAB — OSMOLALITY: Osmolality: 273 mOsm/kg — ABNORMAL LOW (ref 275–295)

## 2016-09-27 LAB — SURGICAL PCR SCREEN
MRSA, PCR: NEGATIVE
STAPHYLOCOCCUS AUREUS: NEGATIVE

## 2016-09-27 SURGERY — LAPAROSCOPIC CHOLECYSTECTOMY
Anesthesia: General | Site: Abdomen

## 2016-09-27 MED ORDER — FENTANYL CITRATE (PF) 250 MCG/5ML IJ SOLN
INTRAMUSCULAR | Status: AC
  Filled 2016-09-27: qty 5

## 2016-09-27 MED ORDER — MORPHINE SULFATE (PF) 4 MG/ML IV SOLN
4.0000 mg | Freq: Once | INTRAVENOUS | Status: DC
  Administered 2016-09-27: 4 mg via INTRAVENOUS
  Filled 2016-09-27: qty 1

## 2016-09-27 MED ORDER — LIDOCAINE HCL (PF) 1 % IJ SOLN
INTRAMUSCULAR | Status: AC
  Filled 2016-09-27: qty 30

## 2016-09-27 MED ORDER — ENOXAPARIN SODIUM 40 MG/0.4ML ~~LOC~~ SOLN
40.0000 mg | SUBCUTANEOUS | Status: DC
  Administered 2016-09-28 – 2016-09-29 (×2): 40 mg via SUBCUTANEOUS
  Filled 2016-09-27 (×2): qty 0.4

## 2016-09-27 MED ORDER — FENTANYL CITRATE (PF) 100 MCG/2ML IJ SOLN
INTRAMUSCULAR | Status: DC | PRN
  Administered 2016-09-27 (×2): 50 ug via INTRAVENOUS
  Administered 2016-09-27: 100 ug via INTRAVENOUS

## 2016-09-27 MED ORDER — IOPAMIDOL (ISOVUE-300) INJECTION 61%
100.0000 mL | Freq: Once | INTRAVENOUS | Status: AC | PRN
  Administered 2016-09-27: 100 mL via INTRAVENOUS

## 2016-09-27 MED ORDER — SUCCINYLCHOLINE CHLORIDE 20 MG/ML IJ SOLN
INTRAMUSCULAR | Status: DC | PRN
  Administered 2016-09-27: 100 mg via INTRAVENOUS

## 2016-09-27 MED ORDER — NEOSTIGMINE METHYLSULFATE 10 MG/10ML IV SOLN
INTRAVENOUS | Status: AC
  Filled 2016-09-27: qty 1

## 2016-09-27 MED ORDER — NEOSTIGMINE METHYLSULFATE 5 MG/5ML IV SOSY
PREFILLED_SYRINGE | INTRAVENOUS | Status: DC | PRN
  Administered 2016-09-27: 2.5 mg via INTRAVENOUS

## 2016-09-27 MED ORDER — LABETALOL HCL 5 MG/ML IV SOLN
INTRAVENOUS | Status: DC | PRN
  Administered 2016-09-27 (×2): 5 mg via INTRAVENOUS
  Administered 2016-09-27: 10 mg via INTRAVENOUS

## 2016-09-27 MED ORDER — TIMOLOL MALEATE 0.25 % OP SOLN
1.0000 [drp] | Freq: Every day | OPHTHALMIC | Status: DC
  Administered 2016-09-28 – 2016-09-29 (×2): 1 [drp] via OPHTHALMIC
  Filled 2016-09-27: qty 5

## 2016-09-27 MED ORDER — HYDRALAZINE HCL 20 MG/ML IJ SOLN: 10.0000 mg | Freq: Four times a day (QID) | INTRAMUSCULAR | Status: DC | PRN

## 2016-09-27 MED ORDER — MIDAZOLAM HCL 2 MG/2ML IJ SOLN
INTRAMUSCULAR | Status: AC
  Filled 2016-09-27: qty 2

## 2016-09-27 MED ORDER — ONDANSETRON HCL 4 MG/2ML IJ SOLN
4.0000 mg | Freq: Once | INTRAMUSCULAR | Status: DC
  Administered 2016-09-27: 4 mg via INTRAVENOUS
  Filled 2016-09-27: qty 2

## 2016-09-27 MED ORDER — BUPIVACAINE HCL (PF) 0.5 % IJ SOLN
INTRAMUSCULAR | Status: AC
  Filled 2016-09-27: qty 30

## 2016-09-27 MED ORDER — LABETALOL HCL 5 MG/ML IV SOLN
INTRAVENOUS | Status: AC
  Filled 2016-09-27: qty 4

## 2016-09-27 MED ORDER — INSULIN ASPART 100 UNIT/ML ~~LOC~~ SOLN
0.0000 [IU] | SUBCUTANEOUS | Status: DC
  Administered 2016-09-27 – 2016-09-28 (×3): 2 [IU] via SUBCUTANEOUS
  Administered 2016-09-28 (×3): 1 [IU] via SUBCUTANEOUS
  Administered 2016-09-28: 2 [IU] via SUBCUTANEOUS
  Administered 2016-09-29: 1 [IU] via SUBCUTANEOUS

## 2016-09-27 MED ORDER — METOPROLOL TARTRATE 5 MG/5ML IV SOLN: 5.0000 mg | INTRAVENOUS | Status: DC | PRN

## 2016-09-27 MED ORDER — ONDANSETRON HCL 4 MG/2ML IJ SOLN
INTRAMUSCULAR | Status: AC
  Filled 2016-09-27: qty 4

## 2016-09-27 MED ORDER — CHLORHEXIDINE GLUCONATE CLOTH 2 % EX PADS: 6.0000 | MEDICATED_PAD | Freq: Once | CUTANEOUS | Status: DC

## 2016-09-27 MED ORDER — HEPARIN SODIUM (PORCINE) 5000 UNIT/ML IJ SOLN: 5000.0000 [IU] | Freq: Three times a day (TID) | INTRAMUSCULAR | Status: DC

## 2016-09-27 MED ORDER — OXYCODONE-ACETAMINOPHEN 5-325 MG PO TABS
1.0000 | ORAL_TABLET | ORAL | Status: DC | PRN
  Administered 2016-09-27 – 2016-09-28 (×2): 1 via ORAL
  Filled 2016-09-27 (×2): qty 1

## 2016-09-27 MED ORDER — ONDANSETRON HCL 4 MG/2ML IJ SOLN
8.0000 mg | Freq: Once | INTRAMUSCULAR | Status: AC
  Administered 2016-09-27: 8 mg via INTRAVENOUS

## 2016-09-27 MED ORDER — LIDOCAINE HCL 1 % IJ SOLN
INTRAMUSCULAR | Status: DC | PRN
  Administered 2016-09-27: 19 mL via INTRAMUSCULAR

## 2016-09-27 MED ORDER — PROPOFOL 10 MG/ML IV BOLUS
INTRAVENOUS | Status: AC
  Filled 2016-09-27: qty 20

## 2016-09-27 MED ORDER — ONDANSETRON HCL 4 MG/2ML IJ SOLN
4.0000 mg | Freq: Once | INTRAMUSCULAR | Status: AC
Start: 2016-09-27 — End: 2016-09-27
  Administered 2016-09-27: 4 mg via INTRAVENOUS

## 2016-09-27 MED ORDER — PIPERACILLIN-TAZOBACTAM 3.375 G IVPB 30 MIN
3.3750 g | Freq: Once | INTRAVENOUS | Status: AC
  Administered 2016-09-27: 3.375 g via INTRAVENOUS
  Filled 2016-09-27: qty 50

## 2016-09-27 MED ORDER — MIDAZOLAM HCL 2 MG/2ML IJ SOLN
1.0000 mg | INTRAMUSCULAR | Status: DC | PRN
  Administered 2016-09-27: 2 mg via INTRAVENOUS

## 2016-09-27 MED ORDER — SODIUM CHLORIDE 0.9 % IV BOLUS (SEPSIS): 1000.0000 mL | Freq: Once | INTRAVENOUS | Status: DC

## 2016-09-27 MED ORDER — SUCCINYLCHOLINE CHLORIDE 20 MG/ML IJ SOLN
INTRAMUSCULAR | Status: AC
  Filled 2016-09-27: qty 1

## 2016-09-27 MED ORDER — ACETAMINOPHEN 650 MG RE SUPP: 650.0000 mg | Freq: Four times a day (QID) | RECTAL | Status: DC | PRN

## 2016-09-27 MED ORDER — GLYCOPYRROLATE 0.2 MG/ML IV SOSY
PREFILLED_SYRINGE | INTRAVENOUS | Status: DC | PRN
  Administered 2016-09-27: 0.4 mg via INTRAVENOUS

## 2016-09-27 MED ORDER — POVIDONE-IODINE 10 % OINT PACKET
TOPICAL_OINTMENT | CUTANEOUS | Status: DC | PRN
  Administered 2016-09-27: 1 via TOPICAL

## 2016-09-27 MED ORDER — ONDANSETRON HCL 4 MG/2ML IJ SOLN
INTRAMUSCULAR | Status: AC
  Filled 2016-09-27: qty 2

## 2016-09-27 MED ORDER — POTASSIUM CHLORIDE IN NACL 20-0.9 MEQ/L-% IV SOLN
INTRAVENOUS | Status: DC
  Administered 2016-09-27: 15:00:00 via INTRAVENOUS

## 2016-09-27 MED ORDER — METRONIDAZOLE IN NACL 5-0.79 MG/ML-% IV SOLN
500.0000 mg | Freq: Once | INTRAVENOUS | Status: DC
  Filled 2016-09-27: qty 100

## 2016-09-27 MED ORDER — ACETAMINOPHEN 325 MG PO TABS
650.0000 mg | ORAL_TABLET | Freq: Four times a day (QID) | ORAL | Status: DC | PRN
  Administered 2016-09-28 – 2016-09-29 (×3): 650 mg via ORAL
  Filled 2016-09-27 (×3): qty 2

## 2016-09-27 MED ORDER — FENTANYL CITRATE (PF) 100 MCG/2ML IJ SOLN
25.0000 ug | INTRAMUSCULAR | Status: DC | PRN
  Administered 2016-09-27: 25 ug via INTRAVENOUS
  Filled 2016-09-27: qty 2

## 2016-09-27 MED ORDER — SODIUM CHLORIDE 0.9 % IR SOLN
Status: DC | PRN
  Administered 2016-09-27: 1000 mL

## 2016-09-27 MED ORDER — POTASSIUM CHLORIDE 10 MEQ/100ML IV SOLN
10.0000 meq | INTRAVENOUS | Status: AC
  Administered 2016-09-27 (×2): 10 meq via INTRAVENOUS
  Filled 2016-09-27 (×2): qty 100

## 2016-09-27 MED ORDER — ROCURONIUM BROMIDE 100 MG/10ML IV SOLN
INTRAVENOUS | Status: DC | PRN
  Administered 2016-09-27: 5 mg via INTRAVENOUS
  Administered 2016-09-27: 10 mg via INTRAVENOUS
  Administered 2016-09-27: 15 mg via INTRAVENOUS

## 2016-09-27 MED ORDER — GLYCOPYRROLATE 0.2 MG/ML IJ SOLN
INTRAMUSCULAR | Status: AC
  Filled 2016-09-27: qty 2

## 2016-09-27 MED ORDER — POTASSIUM CHLORIDE CRYS ER 20 MEQ PO TBCR
40.0000 meq | EXTENDED_RELEASE_TABLET | Freq: Once | ORAL | Status: AC
  Administered 2016-09-27: 40 meq via ORAL
  Filled 2016-09-27: qty 2

## 2016-09-27 MED ORDER — MORPHINE SULFATE (PF) 4 MG/ML IV SOLN
2.0000 mg | INTRAVENOUS | Status: DC | PRN
  Administered 2016-09-27: 2 mg via INTRAVENOUS
  Filled 2016-09-27: qty 1

## 2016-09-27 MED ORDER — POVIDONE-IODINE 10 % EX OINT
TOPICAL_OINTMENT | CUTANEOUS | Status: AC
  Filled 2016-09-27: qty 1

## 2016-09-27 MED ORDER — LACTATED RINGERS IV SOLN
INTRAVENOUS | Status: DC
  Administered 2016-09-27: 1000 mL via INTRAVENOUS

## 2016-09-27 MED ORDER — SODIUM CHLORIDE 0.9 % IV BOLUS (SEPSIS)
1000.0000 mL | Freq: Once | INTRAVENOUS | Status: AC
  Administered 2016-09-27: 1000 mL via INTRAVENOUS

## 2016-09-27 MED ORDER — PIPERACILLIN-TAZOBACTAM 3.375 G IVPB
3.3750 g | Freq: Three times a day (TID) | INTRAVENOUS | Status: DC
  Administered 2016-09-27 – 2016-09-29 (×6): 3.375 g via INTRAVENOUS
  Filled 2016-09-27 (×5): qty 50

## 2016-09-27 MED ORDER — ONDANSETRON 4 MG PO TBDP
4.0000 mg | ORAL_TABLET | Freq: Once | ORAL | Status: AC
  Administered 2016-09-27: 4 mg via ORAL
  Filled 2016-09-27: qty 1

## 2016-09-27 MED ORDER — POTASSIUM CHLORIDE 10 MEQ/100ML IV SOLN
10.0000 meq | INTRAVENOUS | Status: AC
  Administered 2016-09-27: 10 meq via INTRAVENOUS
  Filled 2016-09-27: qty 100

## 2016-09-27 MED ORDER — POTASSIUM CHLORIDE IN NACL 20-0.9 MEQ/L-% IV SOLN
Freq: Once | INTRAVENOUS | Status: AC
  Administered 2016-09-27: 08:00:00 via INTRAVENOUS
  Filled 2016-09-27: qty 1000

## 2016-09-27 MED ORDER — ONDANSETRON HCL 4 MG/2ML IJ SOLN
4.0000 mg | Freq: Four times a day (QID) | INTRAMUSCULAR | Status: DC | PRN
  Administered 2016-09-27 (×2): 4 mg via INTRAVENOUS
  Filled 2016-09-27: qty 2

## 2016-09-27 MED ORDER — ROCURONIUM BROMIDE 50 MG/5ML IV SOLN
INTRAVENOUS | Status: AC
  Filled 2016-09-27: qty 1

## 2016-09-27 MED ORDER — ONDANSETRON HCL 4 MG/2ML IJ SOLN
4.0000 mg | Freq: Once | INTRAMUSCULAR | Status: AC
  Administered 2016-09-27: 4 mg via INTRAVENOUS

## 2016-09-27 MED ORDER — ATENOLOL 25 MG PO TABS
50.0000 mg | ORAL_TABLET | Freq: Two times a day (BID) | ORAL | Status: DC
  Administered 2016-09-27 (×2): 50 mg via ORAL
  Administered 2016-09-28: 25 mg via ORAL
  Administered 2016-09-28 – 2016-09-29 (×2): 50 mg via ORAL
  Filled 2016-09-27 (×5): qty 2

## 2016-09-27 MED ORDER — ASPIRIN EC 81 MG PO TBEC
81.0000 mg | DELAYED_RELEASE_TABLET | Freq: Every evening | ORAL | Status: DC
  Administered 2016-09-27 – 2016-09-28 (×2): 81 mg via ORAL
  Filled 2016-09-27 (×2): qty 1

## 2016-09-27 MED ORDER — LATANOPROST 0.005 % OP SOLN
1.0000 [drp] | Freq: Every day | OPHTHALMIC | Status: DC
  Administered 2016-09-27 – 2016-09-28 (×2): 1 [drp] via OPHTHALMIC
  Filled 2016-09-27: qty 2.5

## 2016-09-27 MED ORDER — PROPOFOL 10 MG/ML IV BOLUS
INTRAVENOUS | Status: DC | PRN
  Administered 2016-09-27: 120 mg via INTRAVENOUS

## 2016-09-27 MED ORDER — NITROGLYCERIN 2 % TD OINT
1.0000 [in_us] | TOPICAL_OINTMENT | Freq: Once | TRANSDERMAL | Status: AC
  Administered 2016-09-27: 1 [in_us] via TOPICAL
  Filled 2016-09-27: qty 1

## 2016-09-27 MED ORDER — HEMOSTATIC AGENTS (NO CHARGE) OPTIME
TOPICAL | Status: DC | PRN
  Administered 2016-09-27: 1 via TOPICAL

## 2016-09-27 MED ORDER — NITROGLYCERIN 2 % TD OINT
TOPICAL_OINTMENT | TRANSDERMAL | Status: AC
Start: 2016-09-27 — End: 2016-09-27
  Filled 2016-09-27: qty 1

## 2016-09-27 SURGICAL SUPPLY — 49 items
ADH SKN CLS APL DERMABOND .7 (GAUZE/BANDAGES/DRESSINGS)
APPLIER CLIP LAPSCP 10X32 DD (CLIP) ×2 IMPLANT
BAG HAMPER (MISCELLANEOUS) ×2 IMPLANT
BAG SPEC RTRVL LRG 6X4 10 (ENDOMECHANICALS) ×1
CHLORAPREP W/TINT 26ML (MISCELLANEOUS) ×2 IMPLANT
CLOTH BEACON ORANGE TIMEOUT ST (SAFETY) ×2 IMPLANT
COVER LIGHT HANDLE STERIS (MISCELLANEOUS) ×4 IMPLANT
DECANTER SPIKE VIAL GLASS SM (MISCELLANEOUS) ×4 IMPLANT
DERMABOND ADVANCED (GAUZE/BANDAGES/DRESSINGS)
DERMABOND ADVANCED .7 DNX12 (GAUZE/BANDAGES/DRESSINGS) ×1 IMPLANT
DEVICE TROCAR PUNCTURE CLOSURE (ENDOMECHANICALS) ×2 IMPLANT
ELECT REM PT RETURN 9FT ADLT (ELECTROSURGICAL) ×2
ELECTRODE REM PT RTRN 9FT ADLT (ELECTROSURGICAL) ×1 IMPLANT
FILTER SMOKE EVAC LAPAROSHD (FILTER) ×2 IMPLANT
FORMALIN 10 PREFIL 120ML (MISCELLANEOUS) ×2 IMPLANT
GLOVE BIOGEL PI IND STRL 7.0 (GLOVE) ×1 IMPLANT
GLOVE BIOGEL PI IND STRL 7.5 (GLOVE) ×1 IMPLANT
GLOVE BIOGEL PI INDICATOR 7.0 (GLOVE) ×1
GLOVE BIOGEL PI INDICATOR 7.5 (GLOVE) ×1
GLOVE ECLIPSE 7.0 STRL STRAW (GLOVE) ×2 IMPLANT
GOWN STRL REUS W/ TWL XL LVL3 (GOWN DISPOSABLE) ×1 IMPLANT
GOWN STRL REUS W/TWL LRG LVL3 (GOWN DISPOSABLE) ×4 IMPLANT
GOWN STRL REUS W/TWL XL LVL3 (GOWN DISPOSABLE) ×2
HEMOSTAT SNOW SURGICEL 2X4 (HEMOSTASIS) ×2 IMPLANT
INST SET LAPROSCOPIC AP (KITS) ×2 IMPLANT
IV NS IRRIG 3000ML ARTHROMATIC (IV SOLUTION) IMPLANT
KIT ROOM TURNOVER APOR (KITS) ×2 IMPLANT
MANIFOLD NEPTUNE II (INSTRUMENTS) ×2 IMPLANT
NDL INSUFFLATION 14GA 120MM (NEEDLE) ×1 IMPLANT
NEEDLE INSUFFLATION 14GA 120MM (NEEDLE) ×2 IMPLANT
NS IRRIG 1000ML POUR BTL (IV SOLUTION) ×2 IMPLANT
PACK LAP CHOLE LZT030E (CUSTOM PROCEDURE TRAY) ×2 IMPLANT
PAD ARMBOARD 7.5X6 YLW CONV (MISCELLANEOUS) ×2 IMPLANT
POUCH SPECIMEN RETRIEVAL 10MM (ENDOMECHANICALS) ×2 IMPLANT
SET BASIN LINEN APH (SET/KITS/TRAYS/PACK) ×2 IMPLANT
SET TUBE IRRIG SUCTION NO TIP (IRRIGATION / IRRIGATOR) IMPLANT
SLEEVE ENDOPATH XCEL 5M (ENDOMECHANICALS) ×4 IMPLANT
SPONGE GAUZE 2X2 8PLY STRL LF (GAUZE/BANDAGES/DRESSINGS) ×4 IMPLANT
STAPLER VISISTAT (STAPLE) ×1 IMPLANT
SUT MON AB 4-0 RB1 27 (SUTURE) ×1 IMPLANT
SUT VIC AB 4-0 PS2 27 (SUTURE) ×1 IMPLANT
SUT VICRYL 0 UR6 27IN ABS (SUTURE) ×2 IMPLANT
SUT VICRYL AB 3-0 FS1 BRD 27IN (SUTURE) ×2 IMPLANT
TAPE CLOTH SURG 4X10 WHT LF (GAUZE/BANDAGES/DRESSINGS) ×1 IMPLANT
TROCAR ENDO BLADELESS 11MM (ENDOMECHANICALS) ×2 IMPLANT
TROCAR XCEL NON-BLD 5MMX100MML (ENDOMECHANICALS) ×2 IMPLANT
TUBE CONNECTING 12X1/4 (SUCTIONS) IMPLANT
TUBING INSUFFLATION (TUBING) ×2 IMPLANT
WARMER LAPAROSCOPE (MISCELLANEOUS) ×2 IMPLANT

## 2016-09-27 NOTE — ED Triage Notes (Signed)
Dull, sore pain under lt breast since yesterday around 1700.  Pain started out intermittent and now its constant.

## 2016-09-27 NOTE — ED Notes (Signed)
Family at bedside. 

## 2016-09-27 NOTE — Anesthesia Procedure Notes (Signed)
Procedure Name: MAC Date/Time: 09/27/2016 11:33 AM Performed by: Jonna Munro Pre-anesthesia Checklist: Patient identified, Emergency Drugs available, Suction available, Patient being monitored and Timeout performed Patient Re-evaluated:Patient Re-evaluated prior to inductionOxygen Delivery Method: Circle system utilized Preoxygenation: Pre-oxygenation with 100% oxygen Intubation Type: IV induction, Rapid sequence and Cricoid Pressure applied Laryngoscope Size: Mac and 3 Grade View: Grade I Tube type: Oral Tube size: 7.0 mm Number of attempts: 1 Airway Equipment and Method: Stylet Placement Confirmation: ETT inserted through vocal cords under direct vision,  positive ETCO2 and breath sounds checked- equal and bilateral Secured at: 20 (20 cm at the teeth) cm Tube secured with: Tape Dental Injury: Teeth and Oropharynx as per pre-operative assessment

## 2016-09-27 NOTE — ED Provider Notes (Signed)
[place] DEPT Provider Note   CSN: [id number] Arrival date & time: 09/27/16  0417     History   Chief Complaint Chief Complaint  Patient presents with  . Chest Pain    HPI Terri Mccall is a 82 y.o. female.  HPI  This is an 82 year old female with a history of diabetes, hyperlipidemia, hypertension who presents with chest pain. Patient reports 4 hour history of left sided chest pain. Current pain is 4/10.  It is nonradiating. It is dull and pressure-like. She initially noted the pain yesterday around 5:00. She states that it was short in duration. However, now it is constant. She reports nausea. No shortness of breath or diaphoresis. No heavy lifting or injury. No rash. No history of coronary artery disease.  Past Medical History:  Diagnosis Date  . Arthritis   . Constipation   . Diabetes mellitus    diet controlled  . Hyperlipidemia   . Hypertension     There are no active problems to display for this patient.   Past Surgical History:  Procedure Laterality Date  . ABDOMINAL HYSTERECTOMY    . APPENDECTOMY    . BACK SURGERY  1998  . COLONOSCOPY  04/26/2012   Procedure: COLONOSCOPY;  Surgeon: Rogene Houston, MD;  [facility];  Service: Endoscopy;  Laterality: N/A;  830  . TONSILLECTOMY    . TRIGGER FINGER RELEASE  11/20/2012   Procedure: RELEASE TRIGGER FINGER/A-1 PULLEY;  Surgeon: Wynonia Sours, MD;  Location: [facility];  Service: Orthopedics;  Laterality: Right;  RELEASE A-1 PULLEY RIGHT INDEX FINGER    OB History    No data available       Home Medications    Prior to Admission medications   Medication Sig Start Date End Date Taking? Authorizing Provider  aspirin EC 81 MG tablet Take 81 mg by mouth every evening.   Yes Historical Provider, MD  atenolol (TENORMIN) 50 MG tablet Take 50 mg by mouth 2 (two) times daily.   Yes Historical Provider, MD  beta carotene w/minerals (OCUVITE) tablet Take 1 tablet by mouth every  morning.   Yes Historical Provider, MD  chlorthalidone (HYGROTON) 25 MG tablet Take 25 mg by mouth every morning.   Yes Historical Provider, MD  Chromium-Cinnamon (CINNAMON PLUS CHROMIUM) [phone] MCG-MG CAPS Take 1 capsule by mouth daily.   Yes Historical Provider, MD  glucosamine-chondroitin 500-400 MG tablet Take 1 tablet by mouth 3 (three) times daily.   Yes Historical Provider, MD  latanoprost (XALATAN) 0.005 % ophthalmic solution Place 1 drop into both eyes at bedtime. 07/16/13  Yes Historical Provider, MD  loratadine (CLARITIN) 10 MG tablet Take 10 mg by mouth every morning.   Yes Historical Provider, MD  olmesartan (BENICAR) 40 MG tablet Take 40 mg by mouth every morning.   Yes Historical Provider, MD  potassium chloride SA (K-DUR,KLOR-CON) 20 MEQ tablet Take 20 mEq by mouth every morning.   Yes Historical Provider, MD  PREMARIN vaginal cream Place 1 Applicatorful vaginally once a week. 06/24/13  Yes Historical Provider, MD  simvastatin (ZOCOR) 20 MG tablet Take 20 mg by mouth every evening.   Yes Historical Provider, MD  timolol (BETIMOL) 0.25 % ophthalmic solution 1-2 drops daily.   Yes Historical Provider, MD  guaiFENesin (MUCINEX) 600 MG 12 hr tablet Take 1,200 mg by mouth 2 (two) times daily.    Historical Provider, MD  thiamine (VITAMIN B-1) 100 MG tablet Take 100 mg by mouth every morning.  Historical Provider, MD  vitamin B-12 (CYANOCOBALAMIN) 1000 MCG tablet Take 1,000 mcg by mouth every morning.    Historical Provider, MD    Family History History reviewed. No pertinent family history.  Social History Social History  Substance Use Topics  . Smoking status: Never Smoker  . Smokeless tobacco: Never Used  . Alcohol use Yes     Comment: rare     Allergies   Bextra [valdecoxib]   Review of Systems Review of Systems  Constitutional: Negative for diaphoresis and fever.  Respiratory: Negative for shortness of breath.   Cardiovascular: Positive for chest pain.    Gastrointestinal: Positive for nausea. Negative for abdominal pain and vomiting.  Skin: Negative for rash.  Neurological: Negative for headaches.  All other systems reviewed and are negative.    Physical Exam Updated Vital Signs BP 132/74 (BP Location: Left Arm)   Pulse 81   Temp 98 F (36.7 C)   Resp 22   SpO2 97%   Physical Exam  Constitutional: She is oriented to person, place, and time. She appears well-developed and well-nourished. No distress.  HENT:  Head: Normocephalic and atraumatic.  Cardiovascular: Normal rate, regular rhythm and normal heart sounds.   Pulmonary/Chest: Effort normal. No respiratory distress. She has no wheezes. She exhibits tenderness.  Abdominal: Soft. Bowel sounds are normal. There is no tenderness. There is no guarding.  Neurological: She is alert and oriented to person, place, and time.  Skin: Skin is warm and dry.  Psychiatric: She has a normal mood and affect.  Nursing note and vitals reviewed.    ED Treatments / Results  Labs (all labs ordered are listed, but only abnormal results are displayed) Labs Reviewed  CBC WITH DIFFERENTIAL/PLATELET - Abnormal; Notable for the following:       Result Value   WBC 12.4 (*)    Neutro Abs 9.3 (*)    All other components within normal limits  BASIC METABOLIC PANEL - Abnormal; Notable for the following:    Sodium 127 (*)    Potassium 2.9 (*)    Chloride 88 (*)    Glucose, Bld 159 (*)    All other components within normal limits  HEPATIC FUNCTION PANEL - Abnormal; Notable for the following:    Total Protein 8.4 (*)    All other components within normal limits  TROPONIN I  LIPASE, BLOOD  TROPONIN I    EKG  EKG Interpretation  Date/Time:  Tuesday September 27 2016 04:20:58 EST Ventricular Rate:  77 PR Interval:    QRS Duration: 96 QT Interval:  396 QTC Calculation: 449 R Axis:   -41 Text Interpretation:  Sinus rhythm Multiple ventricular premature complexes Prolonged PR interval Left  axis deviation Probable anteroseptal infarct, old Confirmed by Kiet Geer  MD, [place] ([id number]) on 09/27/2016 4:23:03 AM       Radiology Dg Chest 2 View  Result Date: 09/27/2016 CLINICAL DATA:  Dull pain under the left breast since yesterday. EXAM: CHEST  2 VIEW COMPARISON:  05/19/2016 FINDINGS: Normal heart size and pulmonary vascularity. No focal airspace disease or consolidation in the lungs. No blunting of costophrenic angles. No pneumothorax. Mediastinal contours appear intact. Mild degenerative changes in the spine. Calcification of the aorta. IMPRESSION: No active cardiopulmonary disease. Electronically Signed   By: Lucienne Capers M.D.   On: 09/27/2016 05:36   Ct Abdomen Pelvis W Contrast  Result Date: 09/27/2016 CLINICAL DATA:  Abdominal pain EXAM: CT ABDOMEN AND PELVIS WITH CONTRAST TECHNIQUE: Multidetector CT imaging of the  abdomen and pelvis was performed using the standard protocol following bolus administration of intravenous contrast. Oral contrast was also administered. CONTRAST:  131mL ISOVUE-300 IOPAMIDOL (ISOVUE-300) INJECTION 61% COMPARISON:  None. FINDINGS: Lower chest: There are areas of patchy opacity in the lateral segment right middle lobe and inferior lingula, concerning for mild pneumonia in these areas. There is a nodular opacity in the medial segment of the right middle lobe measuring 6 mm seen on axial slice 4 series 6. Hepatobiliary: No focal liver lesions are evident. Within the gallbladder, there is cholelithiasis. There is gallbladder wall thickening pericholecystic fluid. There is no appreciable biliary duct dilatation. Pancreas: There is no pancreatic mass or inflammatory focus. Spleen: No splenic lesions are evident. Adrenals/Urinary Tract: Adrenals appear normal bilaterally. There is a 4 mm cyst in the upper pole the right kidney posteriorly. There is borderline fullness of the right renal collecting system. There is no similar fullness of the left renal collecting  system. There is no renal or ureteral calculus on either side. Urinary bladder is midline with wall thickness within normal limits. Stomach/Bowel: Rectum is distended with stool. There is fairly diffuse stool throughout colon. There is no bowel wall or mesenteric thickening. There is no evident bowel obstruction. No free air or portal venous air. Vascular/Lymphatic: There is atherosclerotic calcification in the aorta. No aneurysm seen. Major mesenteric vessels appear patent. There is no evident adenopathy in the abdomen or pelvis. Reproductive: Uterus is absent. There is no appreciable pelvic mass or pelvic fluid collection. Other: Appendix is not appreciable. There is no periappendiceal region inflammation. There is a ascites or abscess in the abdomen or pelvis. Musculoskeletal: There is degenerative change in the lumbar spine. There are no blastic or lytic bone lesions. There is no intramuscular or abdominal wall lesion. IMPRESSION: Findings indicative of acute cholecystitis. Specifically, there is cholelithiasis gallbladder wall thickening and pericholecystic fluid. There is no appreciable biliary duct dilatation. Patchy opacity in the right middle lobe and inferior lingula suggests early pneumonia. There is also a 6 mm nodular opacity in the right middle lobe. Non-contrast chest CT at 6-12 months is recommended. If the nodule is stable at time of repeat CT, then future CT at 18-24 months (from today's scan) is considered optional for low-risk patients, but is recommended for high-risk patients. This recommendation follows the consensus statement: Guidelines for Management of Incidental Pulmonary Nodules Detected on CT Images: From the Fleischner Society 2017; Radiology 2017; 284:228-243. Fairly diffuse stool throughout colon. No bowel obstruction. No abscess. No periappendiceal region inflammation. Slight fullness of the right collecting system without renal or ureteral calculus. This finding is of uncertain  etiology. The patient potentially could have passed a calculus recently on the right. Pyelonephritis could present in this manner as well. Note that there is no evidence of renal abscess or perinephric stranding/fluid. Aortic atherosclerosis. Degenerative change throughout the lumbar spine. Uterus absent. Electronically Signed   By: Lowella Grip III M.D.   On: 09/27/2016 07:19    Procedures Procedures (including critical care time)  Medications Ordered in ED Medications  sodium chloride 0.9 % bolus 1,000 mL (1,000 mLs Intravenous Not Given 09/27/16 0605)  piperacillin-tazobactam (ZOSYN) IVPB 3.375 g (not administered)  metroNIDAZOLE (FLAGYL) IVPB 500 mg (not administered)  0.9 % NaCl with KCl 20 mEq/ L  infusion (not administered)  ondansetron (ZOFRAN-ODT) disintegrating tablet 4 mg (4 mg Oral Given 09/27/16 0503)  nitroGLYCERIN (NITROGLYN) 2 % ointment 1 inch (1 inch Topical Given 09/27/16 0503)  sodium chloride 0.9 %  bolus 1,000 mL (0 mLs Intravenous Stopped 09/27/16 0715)  potassium chloride SA (K-DUR,KLOR-CON) CR tablet 40 mEq (40 mEq Oral Given 09/27/16 0611)  ondansetron (ZOFRAN) injection 4 mg ( Intravenous Not Given 09/27/16 0602)  iopamidol (ISOVUE-300) 61 % injection 100 mL (100 mLs Intravenous Contrast Given 09/27/16 0639)     Initial Impression / Assessment and Plan / ED Course  I have reviewed the triage vital signs and the nursing notes.  Pertinent labs & imaging results that were available during my care of the patient were reviewed by me and considered in my medical decision making (see chart for details).  Clinical Course as of Sep 28 731  Tue Sep 27, 2016  0538 Patient returned from x-ray now complaining of vomiting and abdominal pain. Reports abdominal pain started approximately 20 minutes ago. She reports retching. No actual vomiting. Reports recent history of constipation. She is uncomfortable appearing on exam. Mild distention. No tenderness or signs of  peritonitis. Patient given fluids and IV Zofran. Given age and comorbidity, will obtain CT.  [CH]  [phone] Patient reports that she feels better. She just [place]. Reports that she had a significant bowel movement prior to having her CT scan which made her feel better. She currently is without chest pain. She's been given fluids for mild hyponatremia and potassium replacement. We'll continue to monitor. Delta troponin ordered.  [CH]    Clinical Course User Index [CH] Merryl Hacker, MD    Patient presented initially with chest pain. While in the ER developed abdominal pain and nausea. Reports several days of constipation and distention. Her abdomen is benign without point tenderness that she looks uncomfortable. Lab work notable for hyponatremia and hypokalemia. Mild leukocytosis. Initial troponin and EKG are reassuring. Given headache symptoms and comorbidities, will obtain CT scan of the abdomen. CT scan is concerning for acute cholecystitis with cholelithiasis, gallbladder wall thickening, and pericholecystic fluid. Her presentation was very atypical. On recheck, she now states that she feels "bad" again. Fluids were started. She is nothing by mouth. Discussed with Dr. Rosana Hoes, general surgery, he will evaluate the patient. He is requesting hospitalist consult as well given patient's age and comorbidities in anticipation that she may need surgery.    Final Clinical Impressions(s) / ED Diagnoses   Final diagnoses:  Atypical chest pain  Cholecystitis    New Prescriptions New Prescriptions   No medications on file     Merryl Hacker, MD 09/27/16 [phone]

## 2016-09-27 NOTE — Anesthesia Preprocedure Evaluation (Signed)
Anesthesia Evaluation  Patient identified by MRN, date of birth, ID band Patient awake    Reviewed: Allergy & Precautions, H&P , NPO status , Patient's Chart, lab work & pertinent test results  History of Anesthesia Complications Negative for: history of anesthetic complications  Airway Mallampati: II  TM Distance: >3 FB Neck ROM: Full    Dental  (+) Teeth Intact   Pulmonary neg pulmonary ROS,    breath sounds clear to auscultation       Cardiovascular hypertension, Pt. on medications  Rhythm:Regular Rate:Normal     Neuro/Psych negative neurological ROS     GI/Hepatic neg GERD  ,  Endo/Other  diabetes, Type 2  Renal/GU      Musculoskeletal   Abdominal   Peds  Hematology negative hematology ROS (+)   Anesthesia Other Findings   Reproductive/Obstetrics                             Anesthesia Physical Anesthesia Plan  ASA: II  Anesthesia Plan: General   Post-op Pain Management:    Induction: Intravenous, Rapid sequence and Cricoid pressure planned  Airway Management Planned: Oral ETT  Additional Equipment:   Intra-op Plan:   Post-operative Plan: Extubation in OR  Informed Consent: I have reviewed the patients History and Physical, chart, labs and discussed the procedure including the risks, benefits and alternatives for the proposed anesthesia with the patient or authorized representative who has indicated his/her understanding and acceptance.     Plan Discussed with:   Anesthesia Plan Comments:         Anesthesia Quick Evaluation

## 2016-09-27 NOTE — ED Notes (Signed)
Rate dose change of potassium chloride. Verbal order from Dr. Candiss Norse to lower rate to 60mL/hr.

## 2016-09-27 NOTE — Consult Note (Signed)
SURGICAL CONSULTATION NOTE (initial) - cpt: 99254  HISTORY OF PRESENT ILLNESS (HPI):  82 y.o. female presented with acute onset of severe post-prandial RUQ > epigastric abdominal pain, followed by nausea without emesis, x <24 hours that began yesterday after she and her husband ate a fried [employer] sandwich for lunch. She thinks she's experienced similar pain before, but it's never been severe enough for her to seek evaluation / treatment for it. She otherwise reports +flatus and denies fever/chills, CP, SOB, or diarrhea and states that her pain has been well-controlled since this morning.  Of note, patient reports that she is able to walk a flight of stairs in the morning without CP or SOB and is able to walk a block or too similarly, but by afternoon/evening, carrying groceries/packages up the same flight of stairs leaves her out of breath by the top of the steps. Patient also reports being told her heart is in "good shape" at her recent check-up with her outpatient PCP, Dr. Willey Blade, and she reports her HbA1C was just above 6 last month.  Surgery is consulted by ED physician Dr. Dina Rich in this context for evaluation and management of acute cholecystitis.  PAST MEDICAL HISTORY (PMH):  Past Medical History:  Diagnosis Date  . Arthritis   . Constipation   . DM2 (diabetes mellitus, type 2) (HCC)    diet controlled  . Hyperlipidemia   . Hypertension      PAST SURGICAL HISTORY ([place]):  Past Surgical History:  Procedure Laterality Date  . ABDOMINAL HYSTERECTOMY    . APPENDECTOMY    . BACK SURGERY  1998  . COLONOSCOPY  04/26/2012   Procedure: COLONOSCOPY;  Surgeon: Rogene Houston, MD;  [facility];  Service: Endoscopy;  Laterality: N/A;  830  . TONSILLECTOMY    . TRIGGER FINGER RELEASE  11/20/2012   Procedure: RELEASE TRIGGER FINGER/A-1 PULLEY;  Surgeon: Wynonia Sours, MD;  Location: [place] [facility];  Service: Orthopedics;  Laterality: Right;  RELEASE A-1 PULLEY RIGHT  INDEX FINGER     MEDICATIONS:  Prior to Admission medications   Medication Sig Start Date End Date Taking? Authorizing Provider  aspirin EC 81 MG tablet Take 81 mg by mouth every evening.   Yes Historical Provider, MD  atenolol (TENORMIN) 50 MG tablet Take 50 mg by mouth 2 (two) times daily.   Yes Historical Provider, MD  beta carotene w/minerals (OCUVITE) tablet Take 1 tablet by mouth every morning.   Yes Historical Provider, MD  chlorthalidone (HYGROTON) 25 MG tablet Take 25 mg by mouth every morning.   Yes Historical Provider, MD  Chromium-Cinnamon (CINNAMON PLUS CHROMIUM) [phone] MCG-MG CAPS Take 1 capsule by mouth daily.   Yes Historical Provider, MD  glucosamine-chondroitin 500-400 MG tablet Take 1 tablet by mouth 3 (three) times daily.   Yes Historical Provider, MD  latanoprost (XALATAN) 0.005 % ophthalmic solution Place 1 drop into both eyes at bedtime. 07/16/13  Yes Historical Provider, MD  loratadine (CLARITIN) 10 MG tablet Take 10 mg by mouth every morning.   Yes Historical Provider, MD  olmesartan (BENICAR) 40 MG tablet Take 40 mg by mouth every morning.   Yes Historical Provider, MD  potassium chloride SA (K-DUR,KLOR-CON) 20 MEQ tablet Take 20 mEq by mouth every morning.   Yes Historical Provider, MD  PREMARIN vaginal cream Place 1 Applicatorful vaginally once a week. 06/24/13  Yes Historical Provider, MD  simvastatin (ZOCOR) 20 MG tablet Take 20 mg by mouth every evening.   Yes  Historical Provider, MD  timolol (BETIMOL) 0.25 % ophthalmic solution 1-2 drops daily.   Yes Historical Provider, MD  guaiFENesin (MUCINEX) 600 MG 12 hr tablet Take 1,200 mg by mouth 2 (two) times daily.    Historical Provider, MD  thiamine (VITAMIN B-1) 100 MG tablet Take 100 mg by mouth every morning.    Historical Provider, MD  vitamin B-12 (CYANOCOBALAMIN) 1000 MCG tablet Take 1,000 mcg by mouth every morning.    Historical Provider, MD     ALLERGIES:  Allergies  Allergen Reactions  . Bextra  [Valdecoxib] Hives     SOCIAL HISTORY:  Social History   Social History  . Marital status: Married    Spouse name: N/A  . Number of children: N/A  . Years of education: N/A   Occupational History  . Not on file.   Social History Main Topics  . Smoking status: Never Smoker  . Smokeless tobacco: Never Used  . Alcohol use Yes     Comment: rare  . Drug use: No  . Sexual activity: Not on file   Other Topics Concern  . Not on file   Social History Narrative  . No narrative on file    The patient currently resides (home / rehab facility / nursing home): Home  The patient normally is (ambulatory / bedbound): Ambulatory   FAMILY HISTORY:  History reviewed. No pertinent family history.   REVIEW OF SYSTEMS:  Constitutional: denies weight loss, fever, chills, or sweats  Eyes: denies any other vision changes, history of eye injury  ENT: denies sore throat, hearing problems  Respiratory: denies shortness of breath, wheezing  Cardiovascular: denies chest pain, palpitations  Gastrointestinal: abdominal pain, N/V, and bowel function as per HPI Genitourinary: denies burning with urination or urinary frequency Musculoskeletal: denies any other joint pains or cramps  Skin: denies any other rashes or skin discolorations  Neurological: denies any other headache, dizziness, weakness  Psychiatric: denies any other depression, anxiety   All other review of systems were negative   VITAL SIGNS:  Temp:  [97.8 F (36.6 C)-98 F (36.7 C)] 97.8 F (36.6 C) (11/21 0937) Pulse Rate:  [76-82] 76 (11/21 0937) Resp:  [16-22] 20 (11/21 0937) BP: (132-169)/(67-106) 151/67 (11/21 0937) SpO2:  [95 %-98 %] 97 % (11/21 0937) Weight:  [70.3 kg (155 lb)] 70.3 kg (155 lb) (11/21 0937)     Height: 5' (152.4 cm) Weight: 70.3 kg (155 lb) BMI (Calculated): 30.3   INTAKE/OUTPUT:  This shift: Total I/O In: 50 [IV Piggyback:50] Out: -   Last 2 shifts: @IOLAST2SHIFTS @   PHYSICAL EXAM:  Constitutional:   -- Normal overweight body habitus  -- Awake, alert, and oriented x3  Eyes:  -- Pupils equally round and reactive to light  -- No scleral icterus  Ear, nose, and throat:  -- No jugular venous distension  Pulmonary:  -- No crackles  -- Equal breath sounds bilaterally -- Breathing non-labored at rest Cardiovascular:  -- S1, S2 present  -- No pericardial rubs Gastrointestinal:  -- Abdomen soft, mild RUQ > epigastric and RLQ tenderness to palpation, nondistended, no guarding/rebound  -- No abdominal masses appreciated, pulsatile or otherwise  Musculoskeletal and Integumentary:  -- Wounds or skin discoloration: None appreciated -- Extremities: B/L UE and LE FROM, hands and feet warm  Neurologic:  -- Motor function: intact and symmetric -- Sensation: intact and symmetric  Labs:  CBC Latest Ref Rng & Units 09/27/2016 06/17/2013 11/20/2012  WBC 4.0 - 10.5 K/uL 12.4(H) - -  Hemoglobin 12.0 - 15.0 g/dL 14.4 15.0 13.5  Hematocrit 36.0 - 46.0 % 40.5 44.0 -  Platelets 150 - 400 K/uL 226 - -   CMP Latest Ref Rng & Units 09/27/2016 06/17/2013 11/16/2012  Glucose 65 - 99 mg/dL 159(H) 126(H) 106(H)  BUN 6 - 20 mg/dL 9 8 12   Creatinine 0.44 - 1.00 mg/dL 0.61 0.90 0.60  Sodium 135 - 145 mmol/L 127(L) 132(L) 135  Potassium 3.5 - 5.1 mmol/L 2.9(L) 3.3(L) 3.5  Chloride 101 - 111 mmol/L 88(L) 95(L) 96  CO2 22 - 32 mmol/L 27 - 27  Calcium 8.9 - 10.3 mg/dL 9.4 - 10.0  Total Protein 6.5 - 8.1 g/dL 8.4(H) - -  Total Bilirubin 0.3 - 1.2 mg/dL 0.9 - -  Alkaline Phos 38 - 126 U/L 65 - -  AST 15 - 41 U/L 28 - -  ALT 14 - 54 U/L 18 - -    Imaging studies:  CT Abdomen and Pelvis with Contrast (09/27/2016) Findings indicative of acute cholecystitis. Specifically, there is cholelithiasis gallbladder wall thickening and pericholecystic fluid. There is no appreciable biliary duct dilatation.  Patchy opacity in the right middle lobe and inferior lingula suggests early pneumonia. There is also a 6 mm  nodular opacity in the right middle lobe. Non-contrast chest CT at 6-12 months is recommended. If the nodule is stable at time of repeat CT, then future CT at 18-24 months (from today's scan) is considered optional for low-risk patients, but is recommended for high-risk patients.  Assessment/Plan: (ICD-10's: K80.00) 82 y.o. female with acute cholecystitis, complicated by asymptomatic possible early pneumonia and pertinent comorbidities including DM, HTN, HLD, chronic constipation, and osteoarthritis.   - NPO, IVF  - medical risk stratification and optimization appreciated  - all risks, benefits, and alternatives to cholecystectomy were discussed with the patient and her husband, who elect(s) to proceed, all of their questions were answered to their expressed satisfaction, and informed consent was obtained   - will plan for laparoscopic cholecystectomy this morning   - DVT prophylaxis (will order Lovenox post-op)  All of the above findings and recommendations were discussed with the patient, her husband, and her medical physician, and all of patient's and her husband's questions were answered to their expressed satisfaction.  Thank you for the opportunity to participate in this patient's care.   -- Marilynne Drivers Rosana Hoes, MD, [place]: [facility] Office: [phone]

## 2016-09-27 NOTE — H&P (Addendum)
TRH H&P   Patient Demographics:    Terri Mccall, is a 82 y.o. female  MRN: [id number]   DOB - 1934-01-21  Admit Date - 09/27/2016  Outpatient Primary MD for the patient is Asencion Noble, MD  Patient coming from: Home   Chief Complaint  Patient presents with  . Chest Pain      HPI:    Terri Mccall  is a 82 y.o. female, with a history of DM, HTN, and HLD, presents with complaints of intermittent abdominal pain for several months, in which she talked her PCP and he thought this was indigestion. Patient states she had upper back pain followed by upper abdominal pain that onset yesterday. She has associated nausea. She denies any fever, chills, shortness of breath, or diaphoresis. While in the ED, she was thought to have chest pain in which initial troponin and chest x-ray were negative. CT of abdomen was done and revealed acute cholecystis. General surgery was consulted and she was started on IV antibiotics. Hospitalist was asked to admit the patient for further evaluation of chest pain and acute cholecystitis.    Review of systems:    In addition to the HPI above,  No Fever-chills, No Headache, No changes with Vision or hearing, No problems swallowing food or Liquids, No Chest pain, Cough or Shortness of Breath, +Ve upper abdominal pain and upper back pain, +Ve nausea some Vommitting, Bowel movements are regular, No Blood in stool or Urine, No dysuria, No new skin rashes or bruises, No new joints pains-aches,  No new weakness, tingling, numbness in any extremity, No recent weight gain or loss, No polyuria, polydypsia or polyphagia, No significant Mental Stressors.  A full 10 point Review of Systems was done,  except as stated above, all other Review of Systems were negative.   With Past History of the following :    Past Medical History:  Diagnosis Date  . Arthritis   . Constipation   . DM2 (diabetes mellitus, type 2) (HCC)    diet controlled  . Hyperlipidemia   . Hypertension       Past Surgical History:  Procedure Laterality Date  . ABDOMINAL HYSTERECTOMY    . APPENDECTOMY    . BACK SURGERY  1998  . COLONOSCOPY  04/26/2012   Procedure: COLONOSCOPY;  Surgeon: Rogene Houston, MD;  Location:  [facility];  Service: Endoscopy;  Laterality: N/A;  830  . TONSILLECTOMY    . TRIGGER FINGER RELEASE  11/20/2012   Procedure: RELEASE TRIGGER FINGER/A-1 PULLEY;  Surgeon: Wynonia Sours, MD;  Location: [facility];  Service: Orthopedics;  Laterality: Right;  RELEASE A-1 PULLEY RIGHT INDEX FINGER      Social History:     Social History  Substance Use Topics  . Smoking status: Never Smoker  . Smokeless tobacco: Never Used  . Alcohol use Yes     Comment: rare       Family History :    History reviewed. No pertinent family history.   Home Medications:   Prior to Admission medications   Medication Sig Start Date End Date Taking? Authorizing Provider  aspirin EC 81 MG tablet Take 81 mg by mouth every evening.   Yes Historical Provider, MD  atenolol (TENORMIN) 50 MG tablet Take 50 mg by mouth 2 (two) times daily.   Yes Historical Provider, MD  beta carotene w/minerals (OCUVITE) tablet Take 1 tablet by mouth every morning.   Yes Historical Provider, MD  chlorthalidone (HYGROTON) 25 MG tablet Take 25 mg by mouth every morning.   Yes Historical Provider, MD  Chromium-Cinnamon (CINNAMON PLUS CHROMIUM) [phone] MCG-MG CAPS Take 1 capsule by mouth daily.   Yes Historical Provider, MD  glucosamine-chondroitin 500-400 MG tablet Take 1 tablet by mouth 3 (three) times daily.   Yes Historical Provider, MD  latanoprost (XALATAN) 0.005 % ophthalmic solution Place 1 drop into both eyes  at bedtime. 07/16/13  Yes Historical Provider, MD  loratadine (CLARITIN) 10 MG tablet Take 10 mg by mouth every morning.   Yes Historical Provider, MD  olmesartan (BENICAR) 40 MG tablet Take 40 mg by mouth every morning.   Yes Historical Provider, MD  potassium chloride SA (K-DUR,KLOR-CON) 20 MEQ tablet Take 20 mEq by mouth every morning.   Yes Historical Provider, MD  PREMARIN vaginal cream Place 1 Applicatorful vaginally once a week. 06/24/13  Yes Historical Provider, MD  simvastatin (ZOCOR) 20 MG tablet Take 20 mg by mouth every evening.   Yes Historical Provider, MD  timolol (BETIMOL) 0.25 % ophthalmic solution 1-2 drops daily.   Yes Historical Provider, MD  guaiFENesin (MUCINEX) 600 MG 12 hr tablet Take 1,200 mg by mouth 2 (two) times daily.    Historical Provider, MD  thiamine (VITAMIN B-1) 100 MG tablet Take 100 mg by mouth every morning.    Historical Provider, MD  vitamin B-12 (CYANOCOBALAMIN) 1000 MCG tablet Take 1,000 mcg by mouth every morning.    Historical Provider, MD     Allergies:     Allergies  Allergen Reactions  . Bextra [Valdecoxib] Hives     Physical Exam:   Vitals  Blood pressure 132/74, pulse 81, temperature 98 F (36.7 C), resp. rate 22, SpO2 97 %.   1. General 82 year-old lady in reasonably good health lying in bed in NAD,   2. Normal affect and insight, Not Suicidal or Homicidal, Awake Alert, Oriented X 3.  3. No F.N deficits, ALL C.Nerves Intact, Strength 5/5 all 4 extremities, Sensation intact all 4 extremities, Plantars down going.  4. Ears and Eyes appear Normal, Conjunctivae clear, PERRLA. Moist Oral Mucosa.  5. Supple Neck, No JVD, No cervical lymphadenopathy appriciated, No Carotid Bruits.  6. Symmetrical Chest wall movement, Good air movement bilaterally, CTAB.  7. RRR, No Gallops, Rubs or Murmurs, No Parasternal Heave.  8. Positive Bowel Sounds, Abdomen Soft,  mild quadrant tenderness, No organomegaly appriciated,No rebound -guarding or  rigidity.  9.  No Cyanosis, Normal Skin Turgor, No Skin Rash or Bruise.  10. Good muscle tone,  joints appear normal , no effusions, Normal ROM.  11. No Palpable Lymph Nodes in Neck or Axillae   Data Review:    CBC  Recent Labs Lab 09/27/16 0424  WBC 12.4*  HGB 14.4  HCT 40.5  PLT 226  MCV 82.0  MCH 29.1  MCHC 35.6  RDW 13.5  LYMPHSABS 1.9  MONOABS 1.0  EOSABS 0.2  BASOSABS 0.0   ------------------------------------------------------------------------------------------------------------------  Chemistries   Recent Labs Lab 09/27/16 0424 09/27/16 0435  NA 127*  --   K 2.9*  --   CL 88*  --   CO2 27  --   GLUCOSE 159*  --   BUN 9  --   CREATININE 0.61  --   CALCIUM 9.4  --   AST  --  28  ALT  --  18  ALKPHOS  --  65  BILITOT  --  0.9   ------------------------------------------------------------------------------------------------------------------ CrCl cannot be calculated (Unknown ideal weight.). ------------------------------------------------------------------------------------------------------------------ No results for input(s): TSH, T4TOTAL, T3FREE, THYROIDAB in the last 72 hours.  Invalid input(s): FREET3  Coagulation profile No results for input(s): INR, PROTIME in the last 168 hours. ------------------------------------------------------------------------------------------------------------------- No results for input(s): DDIMER in the last 72 hours. -------------------------------------------------------------------------------------------------------------------  Cardiac Enzymes  Recent Labs Lab 09/27/16 0424 09/27/16 0709  TROPONINI <0.03 <0.03   ------------------------------------------------------------------------------------------------------------------ No results found for: BNP   ---------------------------------------------------------------------------------------------------------------  Urinalysis No results found  for: COLORURINE, APPEARANCEUR, LABSPEC, PHURINE, GLUCOSEU, HGBUR, BILIRUBINUR, KETONESUR, PROTEINUR, UROBILINOGEN, NITRITE, LEUKOCYTESUR  ----------------------------------------------------------------------------------------------------------------   Imaging Results:    Dg Chest 2 View  Result Date: 09/27/2016 CLINICAL DATA:  Dull pain under the left breast since yesterday. EXAM: CHEST  2 VIEW COMPARISON:  05/19/2016 FINDINGS: Normal heart size and pulmonary vascularity. No focal airspace disease or consolidation in the lungs. No blunting of costophrenic angles. No pneumothorax. Mediastinal contours appear intact. Mild degenerative changes in the spine. Calcification of the aorta. IMPRESSION: No active cardiopulmonary disease. Electronically Signed   By: Lucienne Capers M.D.   On: 09/27/2016 05:36   Ct Abdomen Pelvis W Contrast  Result Date: 09/27/2016 CLINICAL DATA:  Abdominal pain EXAM: CT ABDOMEN AND PELVIS WITH CONTRAST TECHNIQUE: Multidetector CT imaging of the abdomen and pelvis was performed using the standard protocol following bolus administration of intravenous contrast. Oral contrast was also administered. CONTRAST:  152mL ISOVUE-300 IOPAMIDOL (ISOVUE-300) INJECTION 61% COMPARISON:  None. FINDINGS: Lower chest: There are areas of patchy opacity in the lateral segment right middle lobe and inferior lingula, concerning for mild pneumonia in these areas. There is a nodular opacity in the medial segment of the right middle lobe measuring 6 mm seen on axial slice 4 series 6. Hepatobiliary: No focal liver lesions are evident. Within the gallbladder, there is cholelithiasis. There is gallbladder wall thickening pericholecystic fluid. There is no appreciable biliary duct dilatation. Pancreas: There is no pancreatic mass or inflammatory focus. Spleen: No splenic lesions are evident. Adrenals/Urinary Tract: Adrenals appear normal bilaterally. There is a 4 mm cyst in the upper pole the right kidney  posteriorly. There is borderline fullness of the right renal collecting system. There is no similar fullness of the left renal collecting system. There is no renal or ureteral calculus on either side. Urinary bladder is midline with wall thickness within normal limits. Stomach/Bowel: Rectum is distended with stool. There is fairly diffuse stool  throughout colon. There is no bowel wall or mesenteric thickening. There is no evident bowel obstruction. No free air or portal venous air. Vascular/Lymphatic: There is atherosclerotic calcification in the aorta. No aneurysm seen. Major mesenteric vessels appear patent. There is no evident adenopathy in the abdomen or pelvis. Reproductive: Uterus is absent. There is no appreciable pelvic mass or pelvic fluid collection. Other: Appendix is not appreciable. There is no periappendiceal region inflammation. There is a ascites or abscess in the abdomen or pelvis. Musculoskeletal: There is degenerative change in the lumbar spine. There are no blastic or lytic bone lesions. There is no intramuscular or abdominal wall lesion. IMPRESSION: Findings indicative of acute cholecystitis. Specifically, there is cholelithiasis gallbladder wall thickening and pericholecystic fluid. There is no appreciable biliary duct dilatation. Patchy opacity in the right middle lobe and inferior lingula suggests early pneumonia. There is also a 6 mm nodular opacity in the right middle lobe. Non-contrast chest CT at 6-12 months is recommended. If the nodule is stable at time of repeat CT, then future CT at 18-24 months (from today's scan) is considered optional for low-risk patients, but is recommended for high-risk patients. This recommendation follows the consensus statement: Guidelines for Management of Incidental Pulmonary Nodules Detected on CT Images: From the Fleischner Society 2017; Radiology 2017; 284:228-243. Fairly diffuse stool throughout colon. No bowel obstruction. No abscess. No  periappendiceal region inflammation. Slight fullness of the right collecting system without renal or ureteral calculus. This finding is of uncertain etiology. The patient potentially could have passed a calculus recently on the right. Pyelonephritis could present in this manner as well. Note that there is no evidence of renal abscess or perinephric stranding/fluid. Aortic atherosclerosis. Degenerative change throughout the lumbar spine. Uterus absent. Electronically Signed   By: Lowella Grip III M.D.   On: 09/27/2016 07:19    My personal review of EKG: Rhythm NSR   Assessment & Plan:    1. Acute cholecystitis. Abdominal CT is indicative of acute cholecystitis. General surgery was consulted. Keep NPO except for medications. Continue IV antibiotic Zosyn and IV fluids.He is not septic.  Cardio-Pulm Risk stratification for surgery and recommendations to minimize the same:-  A.Cardio-Pulmonary Risk -  this patient is a moderate risk  for adverse Cardio-Pulmonary  Outcome  from surgery, the risks and benefits were discussed and acceptable to the patient and the husband  Recommendations for optimizing Cardio-Pulmonary  Risk risk factors  1. Keep SBP<140, HR<85, use Lopressor 5mg  IV q4hrs PRN, or B.Blocker drip PRN. 2. Moniotr I&Os. 3. Minimal sedation and Narcotics. 4. Good pulmunary toilet. 5. PRN Nebs and as needed oxygen to keep Pox>90% 6. Hb>8, transfuse as needed- Lasix 10mg  IV after each unit PRBC Transfused.   B.Bleeding Risk - no previous surgical complications, no easy bruising,  Antiplate meds ASA.   Lab Results  Component Value Date   PLT 226 09/27/2016                 No results found for: INR, PROTIME    Will request Surgeon to please Order DVT prophylaxis of his/her choice, along with activity, weight bearing precautions and diet if appropriate.     2. Hyponatremia. Continue on IV fluids.    3.Hypokalemia. Replaced potassium.   4. Leukocytosis. Due to #1.   5.  HTN. Pressures are elevated. Continue beta blocker orally with a sip of water, as needed IV beta blocker and hydralazine as needed.    6. DM type 2. Blood sugars are slightly  elevated. Continue sliding scale insulin. A1c.  7. HLD. Continue statin.   8. Glaucoma. Continue home medications.   9.Incidental right middle lobe opacity noted on CT scan. Likely microaspiration during emesis, no cough or shortness of breath, no oxygen demand, Zosyn should suffice. Monitor clinically.    DVT Prophylaxis Heparin   AM Labs Ordered, also please review Full Orders  Family Communication: Admission, patients condition and plan of care including tests being ordered have been discussed with the patient and husband who indicate understanding and agree with the plan and Code Status.  Code Status Full   Likely DC to  Home   Condition GUARDED   Consults called: General surgery   Admission status: Inpatient   Time spent in minutes : 35 minutes    Kenyon Ana M.D on 09/27/2016 at 8:06 AM  Between 7am to 7pm - Pager - [phone]. After 7pm go to [email or web address] - password [email or web address]  [facility] - Office  [phone]    By signing my name below, I, Collene Leyden, attest that this documentation has been prepared under the direction and in the presence of Lala Lund , MD. Electronically signed: Collene Leyden, Scribe. 09/27/16

## 2016-09-27 NOTE — Op Note (Signed)
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)  PROCEDURE(S): (cpt's: [id number]) 1.) Laparoscopic Cholecystectomy  INTRAOPERATIVE FINDINGS: Severe inflammation associated with a pus-filled gallbladder impacted by 2 large gallstones, fixation of the duodenum and loops of small intestine, omentum, and CBD to the gallbladder, particularly at the infundibulum  INTRAOPERATIVE FLUIDS: 1500 mL crystalloid  ESTIMATED BLOOD LOSS: Minimal (<30 mL)   URINE OUTPUT: No foley  SPECIMENS: Gallbladder  IMPLANTS: None  DRAINS: None   COMPLICATIONS: None apparent   CONDITION AT COMPLETION: Hemodynamically stable and extubated  DISPOSITION: PACU   INDICATION(S) FOR PROCEDURE:  Patient is a 82 y.o. female who this admission presented with post-prandial RUQ > epigastric abdominal pain after eating a fried chicken sandwich yesterday. Patient reports several prior less painful episodes. Ultrasound suggested acute calculous cholecystitis. All risks, benefits, and alternatives to above elective procedures were discussed with the patient, who elected to proceed, and informed consent was accordingly obtained at that time.   DETAILS OF PROCEDURE:  Patient was brought to the operating suite and appropriately identified. General anesthesia was administered along with peri-operative prophylactic IV antibiotics, and endotracheal intubation was performed by anesthesiologist, along with NG/OG tube for gastric decompression. In supine position, operative site was prepped and draped in usual sterile fashion, and following a brief time out, initial 5 mm incision was made in a natural skin crease just above the umbilicus. Fascia was then elevated, and a Verress needle was inserted and its proper position confirmed  using aspiration and saline meniscus test.  Upon insufflation of the abdominal cavity with carbon dioxide to a well-tolerated pressure of 12-15 mmHg, 5 mm peri-umbilical port followed by laparoscope were inserted and used to inspect the abdominal cavity and its contents with no injuries from insertion of the first trochar noted. Three additional trocars were inserted, one at the epigastric position (10 mm) and two along the Right costal margin (5 mm). The table was then placed in reverse Trendelenburg position with the Right side up. Filmy adhesions between the gallbladder and omentum/duodenum/transverse colon were lysed using combined blunt and sharp dissection. Due to severe gallbladder dilation, the gallbladder needed to be decompressed using electrocautery and suction prior to being able to grasp the gallbladder. Upon creating a cholecystotomy, purulent fluid drained from the gallbladder. The apex/dome of the gallbladder was grasped with an atraumatic grasper passed through the lateral port and retracted apically over the liver. The infundibulum was also grasped and retracted, exposing Calot's triangle, which required meticulous and systematic dissection to visualize. The peritoneum overlying the gallbladder infundibulum was incised and dissected free of surrounding peritoneal attachments, revealing the cystic duct and cystic artery, which were clipped twice on the patient side and once on the gallbladder specimen side close to the gallbladder. The gallbladder was then dissected from its peritoneal attachments to the liver using electrocautery, and the gallbladder was placed into a laparoscopic specimen bag and removed from the abdominal cavity via the epigastric port site. Hemostasis and secure placement of clips were confirmed, and intra-peritoneal cavity was inspected with no additional findings. Endoclose laparoscopic fascial closure device was then used to re-approximate fascia at the 10 mm epigastric  port site.  All ports were then removed under direct visualization, and abdominal cavity was desuflated. All port sites were irrigated/cleaned, additional local anesthetic was injected at each incision, 3-0 Vicryl was used to re-approximate dermis at 10  mm port site(s), and subcuticular 4-0 Monocryl suture was used to re-approximate skin. Skin was then cleaned, dried, and sterile skin glue was applied. Patient was then safely able to be awakened, extubated, and transferred to PACU for post-operative monitoring and care.   I was present for all aspects of procedure, and there were no intra-operative complications apparent.

## 2016-09-27 NOTE — Transfer of Care (Signed)
Immediate Anesthesia Transfer of Care Note  Patient: Terri Mccall  Procedure(s) Performed: Procedure(s): LAPAROSCOPIC CHOLECYSTECTOMY (N/A)  Patient Location: PACU  Anesthesia Type:General  Level of Consciousness: awake, alert  and oriented  Airway & Oxygen Therapy: Patient Spontanous Breathing and Patient connected to nasal cannula oxygen  Post-op Assessment: Report given to RN and Post -op Vital signs reviewed and stable  Post vital signs: Reviewed and stable  Last Vitals:  Vitals:   09/27/16 1100 09/27/16 1115  BP:  (!) 146/67  Pulse:    Resp: (!) 23 (!) 24  Temp:      Last Pain:  Vitals:   09/27/16 0938  TempSrc:   PainSc: 5          Complications: No apparent anesthesia complications

## 2016-09-28 ENCOUNTER — Encounter (HOSPITAL_COMMUNITY): Payer: Self-pay | Admitting: Surgery

## 2016-09-28 LAB — COMPREHENSIVE METABOLIC PANEL
ALBUMIN: 3.1 g/dL — AB (ref 3.5–5.0)
ALT: 60 U/L — ABNORMAL HIGH (ref 14–54)
ANION GAP: 8 (ref 5–15)
AST: 55 U/L — ABNORMAL HIGH (ref 15–41)
Alkaline Phosphatase: 40 U/L (ref 38–126)
BILIRUBIN TOTAL: 1.3 mg/dL — AB (ref 0.3–1.2)
BUN: 12 mg/dL (ref 6–20)
CO2: 23 mmol/L (ref 22–32)
Calcium: 8.1 mg/dL — ABNORMAL LOW (ref 8.9–10.3)
Chloride: 91 mmol/L — ABNORMAL LOW (ref 101–111)
Creatinine, Ser: 0.73 mg/dL (ref 0.44–1.00)
GFR calc Af Amer: >60 mL/min (ref >60)
GFR calc non Af Amer: >60 mL/min (ref >60)
GLUCOSE: 129 mg/dL — AB (ref 65–99)
POTASSIUM: 3.2 mmol/L — AB (ref 3.5–5.1)
SODIUM: 122 mmol/L — AB (ref 135–145)
TOTAL PROTEIN: 6 g/dL — AB (ref 6.5–8.1)

## 2016-09-28 LAB — GLUCOSE, CAPILLARY
GLUCOSE-CAPILLARY: 139 mg/dL — AB (ref 65–99)
GLUCOSE-CAPILLARY: 107 mg/dL — AB (ref 65–99)
GLUCOSE-CAPILLARY: 152 mg/dL — AB (ref 65–99)
GLUCOSE-CAPILLARY: 140 mg/dL — AB (ref 65–99)
Glucose-Capillary: 124 mg/dL — ABNORMAL HIGH (ref 65–99)

## 2016-09-28 LAB — POCT I-STAT 4, (NA,K, GLUC, HGB,HCT)
GLUCOSE: 133 mg/dL — AB (ref 65–99)
HCT: 43 % (ref 36.0–46.0)
HEMOGLOBIN: 14.6 g/dL (ref 12.0–15.0)
POTASSIUM: 3.6 mmol/L (ref 3.5–5.1)
Sodium: 126 mmol/L — ABNORMAL LOW (ref 135–145)

## 2016-09-28 LAB — CBC
HCT: 32.7 % — ABNORMAL LOW (ref 36.0–46.0)
Hemoglobin: 11.1 g/dL — ABNORMAL LOW (ref 12.0–15.0)
MCH: 27.8 pg (ref 26.0–34.0)
MCHC: 33.9 g/dL (ref 30.0–36.0)
MCV: 82 fL (ref 78.0–100.0)
Platelets: 187 10*3/uL (ref 150–400)
RBC: 3.99 MIL/uL (ref 3.87–5.11)
RDW: 14 % (ref 11.5–15.5)
WBC: 14.1 10*3/uL — ABNORMAL HIGH (ref 4.0–10.5)

## 2016-09-28 LAB — OSMOLALITY, URINE: Osmolality, Ur: 332 mOsm/kg (ref 300–900)

## 2016-09-28 LAB — HEMOGLOBIN A1C
Hgb A1c MFr Bld: 6.7 % — ABNORMAL HIGH (ref 4.8–5.6)
MEAN PLASMA GLUCOSE: 146 mg/dL

## 2016-09-28 LAB — SODIUM, URINE, RANDOM: SODIUM UR: 71 mmol/L

## 2016-09-28 MED ORDER — OXYCODONE-ACETAMINOPHEN 5-325 MG PO TABS: 1.0000 | ORAL_TABLET | ORAL | 0 refills | Status: DC | PRN

## 2016-09-28 MED ORDER — CIPROFLOXACIN HCL 500 MG PO TABS: 500.0000 mg | ORAL_TABLET | Freq: Two times a day (BID) | ORAL | 0 refills | Status: DC

## 2016-09-28 NOTE — Progress Notes (Signed)
[facility] Hospital Day(s): 1.   Post op day(s): 1 Day Post-Op.   Interval History: Patient seen and examined, no acute events or new complaints overnight. Patient reports well-controlled mild-moderate epigastric peri-incisional pain, denies fever/chills, N/V, CP, or SOB.  Review of Systems:  Constitutional: denies fever, chills  HEENT: denies cough or congestion  Respiratory: denies any shortness of breath  Cardiovascular: denies chest pain or palpitations  Gastrointestinal: peri-incisional bdominal pain as per interval history, denies N/V or diarrhea Genitourinary: denies burning with urination or urinary frequency Musculoskeletal: denies pain, decreased motor or sensation Integumentary: denies any other rashes or skin discoloration Neurological: denies HA or vision/hearing changes   Vital signs in last 24 hours: [min-max] current  Temp:  [97.5 F (36.4 C)-98.8 F (37.1 C)] 98.8 F (37.1 C) (11/22 0500) Pulse Rate:  [69-87] 81 (11/22 0500) Resp:  [18-24] 20 (11/22 0500) BP: (115-171)/(42-97) 115/42 (11/22 0500) SpO2:  [89 %-100 %] 99 % (11/22 0500) Weight:  [70.3 kg (155 lb)-72.6 kg (160 lb 0.9 oz)] 72.6 kg (160 lb 0.9 oz) (11/22 0500)     Height: 5' (152.4 cm) Weight: 72.6 kg (160 lb 0.9 oz) BMI (Calculated): 30.3   Intake/Output this shift:  No intake/output data recorded.   Intake/Output last 2 shifts:  @IOLAST2SHIFTS @   Physical Exam:  Constitutional: alert, cooperative and no distress  HENT: normocephalic without obvious abnormality  Eyes: PERRL, EOM's grossly intact and symmetric  Neuro: CN II - XII grossly intact and symmetric without deficit  Respiratory: breathing non-labored at rest  Cardiovascular: regular rate and sinus rhythm  Gastrointestinal: soft, mild-moderate peri-incisional epigastric tenderness to palpation, non-distended, incisions well-approximated without erythema or drainage, mild referred RUQ and LUQ tenderness to palpation   Musculoskeletal: UE and LE FROM, motor and sensation grossly intact  Labs:  CBC:  Lab Results  Component Value Date   WBC 14.1 (H) 09/28/2016   RBC 3.99 09/28/2016   BMP:  Lab Results  Component Value Date   GLUCOSE 129 (H) 09/28/2016   CO2 23 09/28/2016   BUN 12 09/28/2016   CREATININE 0.73 09/28/2016   CALCIUM 8.1 (L) 09/28/2016     Imaging studies: No new pertinent imaging studies   Assessment/Plan: (ICD-10's: K81.0) 82 y.o. female doing well 1 Day Post-Op s/p laparoscopic cholecystectomy for what was found to be suppurative/emphysematous cholecystitis, complicated by asymptomatic possible early pneumonia and pertinent comorbidities including DM, HTN, HLD, chronic constipation, and osteoarthritis.   - pain control prn, minimize narcotics as tolerated             - advance diet as tolerated (discussed with patient and her family)  - complete 5 days PO antibiotics considering pus spillage from gallbladder (added to discharge instructions)  - okay with discharge from surgical perspective when ambulating and tolerating PO             - outpatient surgical follow-up in 2 weeks (added to discharge instructions)             - ambulation encouraged, DVT prophylaxis  - please call if any questions  All of the above findings and recommendations were discussed with the patient and her husband, and all of patient's and her husband's questions were answered to their expressed satisfaction.  Thank you for the opportunity to participate in this patient's care.   -- Marilynne Drivers Rosana Hoes, MD, [place]: [facility] Office: [phone]

## 2016-09-28 NOTE — Progress Notes (Signed)
Pharmacy Antibiotic Note  Terri Mccall is a 82 y.o. female admitted on 09/27/2016 with IAI, s/p lap chole.  Pharmacy has been consulted for ZOSYN dosing.  Plan: Zosyn 3.375gm IV q8h, EID Monitor labs, progress, c/s  Height: 5' (152.4 cm) Weight: 160 lb 0.9 oz (72.6 kg) IBW/kg (Calculated) : 45.5  Temp (24hrs), Avg:98 F (36.7 C), Min:97.5 F (36.4 C), Max:98.8 F (37.1 C)   Recent Labs Lab 09/27/16 0424 09/28/16 0613  WBC 12.4* 14.1*  CREATININE 0.61 0.73    Estimated Creatinine Clearance: 49.8 mL/min (by C-G formula based on SCr of 0.73 mg/dL).    Allergies  Allergen Reactions  . Bextra [Valdecoxib] Hives    Antimicrobials this admission: Zosyn 11/21 >>   Microbiology results:  BCx:   UCx:    Sputum:   11/21 MRSA PCR: negative  Thank you for allowing pharmacy to be a part of this patient's care.  Hart Robinsons A 09/28/2016 11:32 AM

## 2016-09-28 NOTE — Anesthesia Postprocedure Evaluation (Signed)
Anesthesia Post Note  Patient: Terri Mccall  Procedure(s) Performed: Procedure(s) (LRB): LAPAROSCOPIC CHOLECYSTECTOMY (N/A)  Patient location during evaluation: Nursing Unit Anesthesia Type: General Level of consciousness: awake Vital Signs Assessment: post-procedure vital signs reviewed and stable Respiratory status: spontaneous breathing Cardiovascular status: stable Anesthetic complications: no    Last Vitals:  Vitals:   09/27/16 2004 09/28/16 0500  BP: (!) 125/48 (!) 115/42  Pulse: 87 81  Resp: 18 20  Temp: 36.6 C 37.1 C    Last Pain:  Vitals:   09/28/16 0500  TempSrc: Oral  PainSc:                  Drucie Opitz

## 2016-09-28 NOTE — Care Management Important Message (Signed)
Important Message  Patient Details  Name: Terri Mccall MRN: [id number] Date of Birth: 04-Jul-1934   Medicare Important Message Given:  Yes    Sherald Barge, RN 09/28/2016, 11:50 AM

## 2016-09-28 NOTE — Progress Notes (Signed)
Subjective: She states she feels better this morning. She has had no fever overnight.  Objective: Vital signs in last 24 hours: Vitals:   09/27/16 1537 09/27/16 1603 09/27/16 2004 09/28/16 0500  BP: (!) 149/56 (!) 149/87 (!) 125/48 (!) 115/42  Pulse: 86 86 87 81  Resp: 18 18 18 20   Temp: 98 F (36.7 C) 97.5 F (36.4 C) 97.8 F (36.6 C) 98.8 F (37.1 C)  TempSrc: Oral Oral Oral Oral  SpO2: 99% 100% 98% 99%  Weight:    160 lb 0.9 oz (72.6 kg)  Height:       Weight change:   Intake/Output Summary (Last 24 hours) at 09/28/16 [id number] Last data filed at 09/28/16 0500  Gross per 24 hour  Intake          2081.25 ml  Output              525 ml  Net          1556.25 ml    Physical Exam: No distress. Alert and coherent. Lungs clear. Heart regular with no murmurs. Abdomen soft and nondistended. Dressings are dry and in place. Bowel sounds present.  Lab Results:    Results for orders placed or performed during the hospital encounter of 09/27/16 (from the past 24 hour(s))  Glucose, capillary     Status: Abnormal   Collection Time: 09/27/16  9:35 AM  Result Value Ref Range   Glucose-Capillary 134 (H) 65 - 99 mg/dL  Protime-INR     Status: None   Collection Time: 09/27/16 10:15 AM  Result Value Ref Range   Prothrombin Time 13.1 11.4 - 15.2 seconds   INR 0.99   Type and screen [facility]     Status: None   Collection Time: 09/27/16 10:15 AM  Result Value Ref Range   ABO/RH(D) O POS    Antibody Screen NEG    Sample Expiration 09/30/2016   Hemoglobin A1c     Status: Abnormal   Collection Time: 09/27/16 10:15 AM  Result Value Ref Range   Hgb A1c MFr Bld 6.7 (H) 4.8 - 5.6 %   Mean Plasma Glucose 146 mg/dL  Surgical pcr screen     Status: None   Collection Time: 09/27/16 10:20 AM  Result Value Ref Range   MRSA, PCR NEGATIVE NEGATIVE   Staphylococcus aureus NEGATIVE NEGATIVE  I-STAT 4, (NA,K, GLUC, HGB,HCT)     Status: Abnormal   Collection Time: 09/27/16 11:10 AM   Result Value Ref Range   Sodium 126 (L) 135 - 145 mmol/L   Potassium 3.6 3.5 - 5.1 mmol/L   Glucose, Bld 133 (H) 65 - 99 mg/dL   HCT 43.0 36.0 - 46.0 %   Hemoglobin 14.6 12.0 - 15.0 g/dL  Glucose, capillary     Status: Abnormal   Collection Time: 09/27/16  1:31 PM  Result Value Ref Range   Glucose-Capillary 169 (H) 65 - 99 mg/dL  Glucose, capillary     Status: Abnormal   Collection Time: 09/27/16  4:05 PM  Result Value Ref Range   Glucose-Capillary 194 (H) 65 - 99 mg/dL   Comment 1 Notify RN    Comment 2 Document in Chart   Glucose, capillary     Status: Abnormal   Collection Time: 09/27/16  8:03 PM  Result Value Ref Range   Glucose-Capillary 169 (H) 65 - 99 mg/dL   Comment 1 Notify RN    Comment 2 Document in Chart   Glucose, capillary  Status: Abnormal   Collection Time: 09/27/16 11:34 PM  Result Value Ref Range   Glucose-Capillary 166 (H) 65 - 99 mg/dL   Comment 1 Notify RN    Comment 2 Document in Chart   Glucose, capillary     Status: Abnormal   Collection Time: 09/28/16  3:14 AM  Result Value Ref Range   Glucose-Capillary 139 (H) 65 - 99 mg/dL   Comment 1 Notify RN    Comment 2 Document in Chart      ABGS No results for input(s): PHART, PO2ART, TCO2, HCO3 in the last 72 hours.  Invalid input(s): PCO2 CULTURES Recent Results (from the past 240 hour(s))  Surgical pcr screen     Status: None   Collection Time: 09/27/16 10:20 AM  Result Value Ref Range Status   MRSA, PCR NEGATIVE NEGATIVE Final   Staphylococcus aureus NEGATIVE NEGATIVE Final    Comment:        The Xpert SA Assay (FDA approved for NASAL specimens in patients over 38 years of age), is one component of a comprehensive surveillance program.  Test performance has been validated by [facility] for patients greater than or equal to 35 year old. It is not intended to diagnose infection nor to guide or monitor treatment.    Studies/Results: Dg Chest 2 View  Result Date:  09/27/2016 CLINICAL DATA:  Dull pain under the left breast since yesterday. EXAM: CHEST  2 VIEW COMPARISON:  05/19/2016 FINDINGS: Normal heart size and pulmonary vascularity. No focal airspace disease or consolidation in the lungs. No blunting of costophrenic angles. No pneumothorax. Mediastinal contours appear intact. Mild degenerative changes in the spine. Calcification of the aorta. IMPRESSION: No active cardiopulmonary disease. Electronically Signed   By: Lucienne Capers M.D.   On: 09/27/2016 05:36   Ct Abdomen Pelvis W Contrast  Result Date: 09/27/2016 CLINICAL DATA:  Abdominal pain EXAM: CT ABDOMEN AND PELVIS WITH CONTRAST TECHNIQUE: Multidetector CT imaging of the abdomen and pelvis was performed using the standard protocol following bolus administration of intravenous contrast. Oral contrast was also administered. CONTRAST:  122mL ISOVUE-300 IOPAMIDOL (ISOVUE-300) INJECTION 61% COMPARISON:  None. FINDINGS: Lower chest: There are areas of patchy opacity in the lateral segment right middle lobe and inferior lingula, concerning for mild pneumonia in these areas. There is a nodular opacity in the medial segment of the right middle lobe measuring 6 mm seen on axial slice 4 series 6. Hepatobiliary: No focal liver lesions are evident. Within the gallbladder, there is cholelithiasis. There is gallbladder wall thickening pericholecystic fluid. There is no appreciable biliary duct dilatation. Pancreas: There is no pancreatic mass or inflammatory focus. Spleen: No splenic lesions are evident. Adrenals/Urinary Tract: Adrenals appear normal bilaterally. There is a 4 mm cyst in the upper pole the right kidney posteriorly. There is borderline fullness of the right renal collecting system. There is no similar fullness of the left renal collecting system. There is no renal or ureteral calculus on either side. Urinary bladder is midline with wall thickness within normal limits. Stomach/Bowel: Rectum is distended with  stool. There is fairly diffuse stool throughout colon. There is no bowel wall or mesenteric thickening. There is no evident bowel obstruction. No free air or portal venous air. Vascular/Lymphatic: There is atherosclerotic calcification in the aorta. No aneurysm seen. Major mesenteric vessels appear patent. There is no evident adenopathy in the abdomen or pelvis. Reproductive: Uterus is absent. There is no appreciable pelvic mass or pelvic fluid collection. Other: Appendix is not appreciable. There  is no periappendiceal region inflammation. There is a ascites or abscess in the abdomen or pelvis. Musculoskeletal: There is degenerative change in the lumbar spine. There are no blastic or lytic bone lesions. There is no intramuscular or abdominal wall lesion. IMPRESSION: Findings indicative of acute cholecystitis. Specifically, there is cholelithiasis gallbladder wall thickening and pericholecystic fluid. There is no appreciable biliary duct dilatation. Patchy opacity in the right middle lobe and inferior lingula suggests early pneumonia. There is also a 6 mm nodular opacity in the right middle lobe. Non-contrast chest CT at 6-12 months is recommended. If the nodule is stable at time of repeat CT, then future CT at 18-24 months (from today's scan) is considered optional for low-risk patients, but is recommended for high-risk patients. This recommendation follows the consensus statement: Guidelines for Management of Incidental Pulmonary Nodules Detected on CT Images: From the Fleischner Society 2017; Radiology 2017; 284:228-243. Fairly diffuse stool throughout colon. No bowel obstruction. No abscess. No periappendiceal region inflammation. Slight fullness of the right collecting system without renal or ureteral calculus. This finding is of uncertain etiology. The patient potentially could have passed a calculus recently on the right. Pyelonephritis could present in this manner as well. Note that there is no evidence of  renal abscess or perinephric stranding/fluid. Aortic atherosclerosis. Degenerative change throughout the lumbar spine. Uterus absent. Electronically Signed   By: Lowella Grip III M.D.   On: 09/27/2016 07:19   Micro Results: Recent Results (from the past 240 hour(s))  Surgical pcr screen     Status: None   Collection Time: 09/27/16 10:20 AM  Result Value Ref Range Status   MRSA, PCR NEGATIVE NEGATIVE Final   Staphylococcus aureus NEGATIVE NEGATIVE Final    Comment:        The Xpert SA Assay (FDA approved for NASAL specimens in patients over 60 years of age), is one component of a comprehensive surveillance program.  Test performance has been validated by [facility] for patients greater than or equal to 45 year old. It is not intended to diagnose infection nor to guide or monitor treatment.    Studies/Results: Dg Chest 2 View  Result Date: 09/27/2016 CLINICAL DATA:  Dull pain under the left breast since yesterday. EXAM: CHEST  2 VIEW COMPARISON:  05/19/2016 FINDINGS: Normal heart size and pulmonary vascularity. No focal airspace disease or consolidation in the lungs. No blunting of costophrenic angles. No pneumothorax. Mediastinal contours appear intact. Mild degenerative changes in the spine. Calcification of the aorta. IMPRESSION: No active cardiopulmonary disease. Electronically Signed   By: Lucienne Capers M.D.   On: 09/27/2016 05:36   Ct Abdomen Pelvis W Contrast  Result Date: 09/27/2016 CLINICAL DATA:  Abdominal pain EXAM: CT ABDOMEN AND PELVIS WITH CONTRAST TECHNIQUE: Multidetector CT imaging of the abdomen and pelvis was performed using the standard protocol following bolus administration of intravenous contrast. Oral contrast was also administered. CONTRAST:  155mL ISOVUE-300 IOPAMIDOL (ISOVUE-300) INJECTION 61% COMPARISON:  None. FINDINGS: Lower chest: There are areas of patchy opacity in the lateral segment right middle lobe and inferior lingula, concerning for mild  pneumonia in these areas. There is a nodular opacity in the medial segment of the right middle lobe measuring 6 mm seen on axial slice 4 series 6. Hepatobiliary: No focal liver lesions are evident. Within the gallbladder, there is cholelithiasis. There is gallbladder wall thickening pericholecystic fluid. There is no appreciable biliary duct dilatation. Pancreas: There is no pancreatic mass or inflammatory focus. Spleen: No splenic lesions are evident. Adrenals/Urinary  Tract: Adrenals appear normal bilaterally. There is a 4 mm cyst in the upper pole the right kidney posteriorly. There is borderline fullness of the right renal collecting system. There is no similar fullness of the left renal collecting system. There is no renal or ureteral calculus on either side. Urinary bladder is midline with wall thickness within normal limits. Stomach/Bowel: Rectum is distended with stool. There is fairly diffuse stool throughout colon. There is no bowel wall or mesenteric thickening. There is no evident bowel obstruction. No free air or portal venous air. Vascular/Lymphatic: There is atherosclerotic calcification in the aorta. No aneurysm seen. Major mesenteric vessels appear patent. There is no evident adenopathy in the abdomen or pelvis. Reproductive: Uterus is absent. There is no appreciable pelvic mass or pelvic fluid collection. Other: Appendix is not appreciable. There is no periappendiceal region inflammation. There is a ascites or abscess in the abdomen or pelvis. Musculoskeletal: There is degenerative change in the lumbar spine. There are no blastic or lytic bone lesions. There is no intramuscular or abdominal wall lesion. IMPRESSION: Findings indicative of acute cholecystitis. Specifically, there is cholelithiasis gallbladder wall thickening and pericholecystic fluid. There is no appreciable biliary duct dilatation. Patchy opacity in the right middle lobe and inferior lingula suggests early pneumonia. There is also a  6 mm nodular opacity in the right middle lobe. Non-contrast chest CT at 6-12 months is recommended. If the nodule is stable at time of repeat CT, then future CT at 18-24 months (from today's scan) is considered optional for low-risk patients, but is recommended for high-risk patients. This recommendation follows the consensus statement: Guidelines for Management of Incidental Pulmonary Nodules Detected on CT Images: From the Fleischner Society 2017; Radiology 2017; 284:228-243. Fairly diffuse stool throughout colon. No bowel obstruction. No abscess. No periappendiceal region inflammation. Slight fullness of the right collecting system without renal or ureteral calculus. This finding is of uncertain etiology. The patient potentially could have passed a calculus recently on the right. Pyelonephritis could present in this manner as well. Note that there is no evidence of renal abscess or perinephric stranding/fluid. Aortic atherosclerosis. Degenerative change throughout the lumbar spine. Uterus absent. Electronically Signed   By: Lowella Grip III M.D.   On: 09/27/2016 07:19   Medications:  I have reviewed the patient's current medications Scheduled Meds: . aspirin EC  81 mg Oral QPM  . atenolol  50 mg Oral BID  . enoxaparin (LOVENOX) injection  40 mg Subcutaneous Q24H  . insulin aspart  0-9 Units Subcutaneous Q4H  . latanoprost  1 drop Both Eyes QHS  . piperacillin-tazobactam (ZOSYN)  IV  3.375 g Intravenous Q8H  . sodium chloride  1,000 mL Intravenous Once  . timolol  1-2 drop Both Eyes Daily   Continuous Infusions: . 0.9 % NaCl with KCl 20 mEq / L 75 mL/hr at 09/27/16 1524   PRN Meds:.acetaminophen **OR** acetaminophen, hydrALAZINE, metoprolol, morphine, ondansetron, oxyCODONE-acetaminophen   Assessment/Plan: #1. Status post laparoscopic cholecystectomy postop day 1. #2. Possible right middle lobe and lingular pneumonia. Patchy opacities were present on CT. These were not visible on chest  x-ray. She also has a right lung nodule which will be further evaluated with a repeat CT in 6-12 months.  Continue Zosyn. #3. Diabetes. Stable control. Glucose was last 139 earlier this morning. Hemoglobin A1c is 6.7. #4. Hypertension. Controlled. #5. Hypokalemia. Potassium improved to 3.6. #6. Hyponatremia. Sodium 126. Repeat metabolic panel pending. Principal Problem:   Cholecystitis Active Problems:   Hypertension   Hyperlipidemia  DM2 (diabetes mellitus, type 2) ([place])   Hyponatremia   Hypokalemia   Acute cholecystitis     LOS: 1 day   Davius Goudeau 09/28/2016, 7:22 AM

## 2016-09-28 NOTE — Care Management Note (Signed)
Case Management Note  Patient Details  Name: TEVIS VARISCO MRN: [id number] Date of Birth: 02/12/1934  Subjective/Objective:                  Pt s/p lap chole. Large Family at bedside. Pt from home, ind with ADl's. Pt plans to return home with self care today. No DME/HH needs.   Action/Plan: No CM needs.   Expected Discharge Date:    09/28/2016              Expected Discharge Plan:  Home/Self Care  In-House Referral:  NA  Discharge planning Services  CM Consult  Post Acute Care Choice:  NA Choice offered to:  NA  Status of Service:  Completed, signed off  Sherald Barge, RN 09/28/2016, 11:50 AM

## 2016-09-28 NOTE — Discharge Instructions (Signed)
In addition to included general post-operative instructions for Laparoscopic Cholecystectomy,  Diet: Resume home heart healthy diet.   Activity: No heavy lifting (children, pets, laundry) or strenuous activity until follow-up, but light activity and walking are encouraged. Do not drive or drink alcohol if taking narcotic pain medications.   Wound care: Remove dressings in 2 days unless otherwise instructed. Once dressing removed, 2 days after surgery (Thursday, 11/23), may shower/get incision wet with soapy water and pat dry (do not rub incisions), but no baths or submerging incision underwater until follow-up.   Medications: Resume all home medications. For mild to moderate pain: acetaminophen (Tylenol) or ibuprofen (if no kidney disease). Narcotic pain medications, if prescribed, can be used for severe pain, though may cause nausea, constipation, and drowsiness. Do not combine Tylenol and Percocet within a 6 hour period as Percocet contains Tylenol. If you do not need the narcotic pain medication, you do not need to fill the prescription.  Call office [phone]) at any time if any questions, worsening pain, fevers/chills, bleeding, drainage from incision site, or other concerns.

## 2016-09-29 LAB — GLUCOSE, CAPILLARY
GLUCOSE-CAPILLARY: 109 mg/dL — AB (ref 65–99)
Glucose-Capillary: 110 mg/dL — ABNORMAL HIGH (ref 65–99)
Glucose-Capillary: 113 mg/dL — ABNORMAL HIGH (ref 65–99)
Glucose-Capillary: 129 mg/dL — ABNORMAL HIGH (ref 65–99)

## 2016-09-29 LAB — BASIC METABOLIC PANEL
ANION GAP: 9 (ref 5–15)
BUN: 10 mg/dL (ref 6–20)
CHLORIDE: 92 mmol/L — AB (ref 101–111)
CO2: 23 mmol/L (ref 22–32)
Calcium: 8.3 mg/dL — ABNORMAL LOW (ref 8.9–10.3)
Creatinine, Ser: 0.65 mg/dL (ref 0.44–1.00)
Glucose, Bld: 166 mg/dL — ABNORMAL HIGH (ref 65–99)
POTASSIUM: 3.1 mmol/L — AB (ref 3.5–5.1)
SODIUM: 124 mmol/L — AB (ref 135–145)

## 2016-09-29 MED ORDER — POTASSIUM CHLORIDE CRYS ER 20 MEQ PO TBCR
20.0000 meq | EXTENDED_RELEASE_TABLET | Freq: Three times a day (TID) | ORAL | Status: DC
  Administered 2016-09-29: 20 meq via ORAL
  Filled 2016-09-29: qty 1

## 2016-09-29 NOTE — Progress Notes (Signed)
Subjective: She is feeling better. She has less pain. She is not requiring opioids. She little oatmeal and part of an egg for breakfast. She's had no fever. She has minimal cough.  Objective: Vital signs in last 24 hours: Vitals:   09/28/16 0500 09/28/16 1448 09/28/16 2001 09/29/16 0501  BP: (!) 115/42 126/68 (!) 119/45 (!) 142/53  Pulse: 81 84 83 75  Resp: 20 18 18 18   Temp: 98.8 F (37.1 C) 98.2 F (36.8 C) 98 F (36.7 C) 98 F (36.7 C)  TempSrc: Oral Oral Oral Oral  SpO2: 99% 97% 94% 95%  Weight: 160 lb 0.9 oz (72.6 kg)   156 lb 6.4 oz (70.9 kg)  Height:       Weight change: 1 lb 6.4 oz (0.635 kg)  Intake/Output Summary (Last 24 hours) at 09/29/16 0936 Last data filed at 09/29/16 0541  Gross per 24 hour  Intake              440 ml  Output                0 ml  Net              440 ml    Physical Exam: No distress. Lungs clear. Heart regular with no murmurs. Abdomen is soft and nondistended. Dressings are dry and intact. Mild incisional tenderness only. Extremities reveal no edema.  Lab Results:    Results for orders placed or performed during the hospital encounter of 09/27/16 (from the past 24 hour(s))  Glucose, capillary     Status: Abnormal   Collection Time: 09/28/16 11:42 AM  Result Value Ref Range   Glucose-Capillary 107 (H) 65 - 99 mg/dL   Comment 1 Notify RN    Comment 2 Document in Chart   Glucose, capillary     Status: Abnormal   Collection Time: 09/28/16  4:40 PM  Result Value Ref Range   Glucose-Capillary 152 (H) 65 - 99 mg/dL   Comment 1 Notify RN    Comment 2 Document in Chart   Glucose, capillary     Status: Abnormal   Collection Time: 09/28/16  8:07 PM  Result Value Ref Range   Glucose-Capillary 140 (H) 65 - 99 mg/dL  Glucose, capillary     Status: Abnormal   Collection Time: 09/29/16  1:10 AM  Result Value Ref Range   Glucose-Capillary 110 (H) 65 - 99 mg/dL  Glucose, capillary     Status: Abnormal   Collection Time: 09/29/16  4:59 AM   Result Value Ref Range   Glucose-Capillary 113 (H) 65 - 99 mg/dL  Glucose, capillary     Status: Abnormal   Collection Time: 09/29/16  7:48 AM  Result Value Ref Range   Glucose-Capillary 129 (H) 65 - 99 mg/dL   Comment 1 Notify RN    Comment 2 Document in Chart      ABGS No results for input(s): PHART, PO2ART, TCO2, HCO3 in the last 72 hours.  Invalid input(s): PCO2 CULTURES Recent Results (from the past 240 hour(s))  Surgical pcr screen     Status: None   Collection Time: 09/27/16 10:20 AM  Result Value Ref Range Status   MRSA, PCR NEGATIVE NEGATIVE Final   Staphylococcus aureus NEGATIVE NEGATIVE Final    Comment:        The Xpert SA Assay (FDA approved for NASAL specimens in patients over 63 years of age), is one component of a comprehensive surveillance program.  Test performance has been  validated by [facility] for patients greater than or equal to 75 year old. It is not intended to diagnose infection nor to guide or monitor treatment.    Studies/Results: No results found. Micro Results: Recent Results (from the past 240 hour(s))  Surgical pcr screen     Status: None   Collection Time: 09/27/16 10:20 AM  Result Value Ref Range Status   MRSA, PCR NEGATIVE NEGATIVE Final   Staphylococcus aureus NEGATIVE NEGATIVE Final    Comment:        The Xpert SA Assay (FDA approved for NASAL specimens in patients over 66 years of age), is one component of a comprehensive surveillance program.  Test performance has been validated by [facility] for patients greater than or equal to 10 year old. It is not intended to diagnose infection nor to guide or monitor treatment.    Studies/Results: No results found. Medications:  I have reviewed the patient's current medications Scheduled Meds: . aspirin EC  81 mg Oral QPM  . atenolol  50 mg Oral BID  . enoxaparin (LOVENOX) injection  40 mg Subcutaneous Q24H  . insulin aspart  0-9 Units Subcutaneous Q4H  . latanoprost   1 drop Both Eyes QHS  . piperacillin-tazobactam (ZOSYN)  IV  3.375 g Intravenous Q8H  . potassium chloride  20 mEq Oral TID  . sodium chloride  1,000 mL Intravenous Once  . timolol  1-2 drop Both Eyes Daily   Continuous Infusions: PRN Meds:.acetaminophen **OR** acetaminophen, hydrALAZINE, metoprolol, morphine, ondansetron, oxyCODONE-acetaminophen   Assessment/Plan: #1. Acute cholecystitis. Postop day 2 status post lap or scopic cholecystectomy. Gradually improving. Tolerating her diet reasonably well. #2. Hyponatremia. Serum sodium dropped to 122 yesterday. Chlorthalidone is held. Repeat metabolic panel pending this morning. #3. Hypokalemia. Serum potassium has dropped to 3.2. Supplement orally. #4. Diabetes. Glucoses are satisfactorily controlled. #5. Possible pneumonia. Plan to transition Zosyn to Augmentin at discharge. Principal Problem:   Cholecystitis Active Problems:   Hypertension   Hyperlipidemia   DM2 (diabetes mellitus, type 2) ([place])   Hyponatremia   Hypokalemia   Acute cholecystitis     LOS: 2 days   Terri Mccall 09/29/2016, 9:36 AM

## 2016-09-29 NOTE — Progress Notes (Signed)
IV removed, WNL. D/C instructions given to pt. Verbalized understanding. Pt riding home with pt.

## 2016-10-07 DIAGNOSIS — E119 Type 2 diabetes mellitus without complications: Secondary | ICD-10-CM | POA: Diagnosis not present

## 2016-10-07 DIAGNOSIS — E876 Hypokalemia: Secondary | ICD-10-CM | POA: Diagnosis not present

## 2016-10-07 DIAGNOSIS — E871 Hypo-osmolality and hyponatremia: Secondary | ICD-10-CM | POA: Diagnosis not present

## 2016-10-07 DIAGNOSIS — Z9889 Other specified postprocedural states: Secondary | ICD-10-CM | POA: Diagnosis not present

## 2016-10-10 NOTE — Discharge Summary (Signed)
Physician Discharge Summary  Terri Mccall [id number] DOB: 11/18/1933 DOA: 09/27/2016   Admit date: 09/27/2016 Discharge date: 10/10/2016  Discharge Diagnoses:  Principal Problem:   Cholecystitis Active Problems:   Hypertension   Hyperlipidemia   DM2 (diabetes mellitus, type 2) ([place])   Hyponatremia   Hypokalemia   Acute cholecystitis    Wt Readings from Last 3 Encounters:  09/29/16 156 lb 6.4 oz (70.9 kg)  09/09/14 150 lb (68 kg)  08/17/13 150 lb (68 kg)     Hospital Course: This patient is an 82 year old female who presented with abdominal and chest pain and was diagnosed with acute cholecystitis. She underwent laparoscopic cholecystectomy by Dr. Rosana Hoes. She had a hyponatremia of 122. Urine sodium was 71. She has been treated for hypertension with chlorthalidone. Chlorthalidone was held. She was treated with IV fluids. She was also hypokalemic and was supplemented with additional potassium.  Her diabetes has been well controlled. Hemoglobin A1c is 6.7.  Along with cholecystitis being found on abdominal CT she was found to have opacities in the right middle lobe and lingula possibly consistent with pneumonia. She was treated with antibiotics to cover pneumonia. She had experienced a mild cough but no sputum production. She was also found to have a 6 mm right middle lobe nodule which will be further evaluated with a follow-up CT in 6-12 months.  Her abdominal pain improved significantly following surgery. She tolerated her diet well. Her overall condition was much improved and stable for discharge on the morning of 11/23. She'll be seen in follow-up in my office in one week.  Chlorthalidone will be held. She will have additional potassium supplementation. Sodium will be rechecked.  Surgical follow-up arranged as well.  Discharge exam reveals a clear chest. Heart was regular with no murmurs or gallops. Abdomen was soft and nondistended. Wounds are clean and dry.    Discharge  Instructions     Medication List    STOP taking these medications   chlorthalidone 25 MG tablet Commonly known as:  HYGROTON   naproxen sodium 220 MG tablet Commonly known as:  ANAPROX     TAKE these medications   aspirin EC 81 MG tablet Take 81 mg by mouth every evening.   atenolol 50 MG tablet Commonly known as:  TENORMIN Take 50 mg by mouth 2 (two) times daily.   beta carotene w/minerals tablet Take 1 tablet by mouth every morning.   CINNAMON PLUS CHROMIUM [phone] MCG-MG Caps Generic drug:  Chromium-Cinnamon Take 1 capsule by mouth daily.   ciprofloxacin 500 MG tablet Commonly known as:  CIPRO Take 1 tablet (500 mg total) by mouth 2 (two) times daily.   fluticasone 50 MCG/ACT nasal spray Commonly known as:  FLONASE Place 1 spray into both nostrils daily.   glucosamine-chondroitin 500-400 MG tablet Take 2 tablets by mouth daily.   guaiFENesin 600 MG 12 hr tablet Commonly known as:  MUCINEX Take 1,200 mg by mouth 2 (two) times daily as needed for to loosen phlegm.   latanoprost 0.005 % ophthalmic solution Commonly known as:  XALATAN Place 1 drop into both eyes at bedtime.   loratadine 10 MG tablet Commonly known as:  CLARITIN Take 10 mg by mouth every morning.   olmesartan 40 MG tablet Commonly known as:  BENICAR Take 40 mg by mouth every morning.   oxyCODONE-acetaminophen 5-325 MG tablet Commonly known as:  PERCOCET/ROXICET Take 1 tablet by mouth every 4 (four) hours as needed for moderate pain.   potassium chloride SA  20 MEQ tablet Commonly known as:  K-DUR,KLOR-CON Take 20 mEq by mouth every morning.   PREMARIN vaginal cream Generic drug:  conjugated estrogens Place 1 Applicatorful vaginally once a week.   simvastatin 20 MG tablet Commonly known as:  ZOCOR Take 20 mg by mouth every evening.   timolol 0.25 % ophthalmic solution Commonly known as:  BETIMOL 1-2 drops daily.        [name] 10/10/2016

## 2016-12-27 DIAGNOSIS — E113293 Type 2 diabetes mellitus with mild nonproliferative diabetic retinopathy without macular edema, bilateral: Secondary | ICD-10-CM | POA: Diagnosis not present

## 2016-12-27 DIAGNOSIS — H348312 Tributary (branch) retinal vein occlusion, right eye, stable: Secondary | ICD-10-CM | POA: Diagnosis not present

## 2016-12-29 DIAGNOSIS — I872 Venous insufficiency (chronic) (peripheral): Secondary | ICD-10-CM | POA: Diagnosis not present

## 2016-12-29 DIAGNOSIS — L820 Inflamed seborrheic keratosis: Secondary | ICD-10-CM | POA: Diagnosis not present

## 2016-12-29 DIAGNOSIS — L570 Actinic keratosis: Secondary | ICD-10-CM | POA: Diagnosis not present

## 2016-12-29 DIAGNOSIS — R609 Edema, unspecified: Secondary | ICD-10-CM | POA: Diagnosis not present

## 2016-12-29 DIAGNOSIS — X32XXXD Exposure to sunlight, subsequent encounter: Secondary | ICD-10-CM | POA: Diagnosis not present

## 2016-12-29 DIAGNOSIS — I10 Essential (primary) hypertension: Secondary | ICD-10-CM | POA: Diagnosis not present

## 2017-01-10 DIAGNOSIS — Z79899 Other long term (current) drug therapy: Secondary | ICD-10-CM | POA: Diagnosis not present

## 2017-01-10 DIAGNOSIS — E119 Type 2 diabetes mellitus without complications: Secondary | ICD-10-CM | POA: Diagnosis not present

## 2017-01-10 DIAGNOSIS — E041 Nontoxic single thyroid nodule: Secondary | ICD-10-CM | POA: Diagnosis not present

## 2017-01-10 DIAGNOSIS — E785 Hyperlipidemia, unspecified: Secondary | ICD-10-CM | POA: Diagnosis not present

## 2017-01-10 DIAGNOSIS — E876 Hypokalemia: Secondary | ICD-10-CM | POA: Diagnosis not present

## 2017-01-19 DIAGNOSIS — E871 Hypo-osmolality and hyponatremia: Secondary | ICD-10-CM | POA: Diagnosis not present

## 2017-01-19 DIAGNOSIS — E119 Type 2 diabetes mellitus without complications: Secondary | ICD-10-CM | POA: Diagnosis not present

## 2017-01-31 DIAGNOSIS — H0011 Chalazion right upper eyelid: Secondary | ICD-10-CM | POA: Diagnosis not present

## 2017-02-13 DIAGNOSIS — H0011 Chalazion right upper eyelid: Secondary | ICD-10-CM | POA: Diagnosis not present

## 2017-02-13 DIAGNOSIS — H2513 Age-related nuclear cataract, bilateral: Secondary | ICD-10-CM | POA: Diagnosis not present

## 2017-03-07 DIAGNOSIS — H2513 Age-related nuclear cataract, bilateral: Secondary | ICD-10-CM | POA: Diagnosis not present

## 2017-03-23 DIAGNOSIS — N390 Urinary tract infection, site not specified: Secondary | ICD-10-CM | POA: Diagnosis not present

## 2017-03-30 DIAGNOSIS — H2511 Age-related nuclear cataract, right eye: Secondary | ICD-10-CM | POA: Diagnosis not present

## 2017-03-30 DIAGNOSIS — H25811 Combined forms of age-related cataract, right eye: Secondary | ICD-10-CM | POA: Diagnosis not present

## 2017-04-11 ENCOUNTER — Other Ambulatory Visit (HOSPITAL_COMMUNITY): Payer: Self-pay | Admitting: Internal Medicine

## 2017-04-11 DIAGNOSIS — R911 Solitary pulmonary nodule: Secondary | ICD-10-CM

## 2017-04-17 DIAGNOSIS — R05 Cough: Secondary | ICD-10-CM | POA: Diagnosis not present

## 2017-04-20 DIAGNOSIS — H2512 Age-related nuclear cataract, left eye: Secondary | ICD-10-CM | POA: Diagnosis not present

## 2017-04-20 DIAGNOSIS — H25812 Combined forms of age-related cataract, left eye: Secondary | ICD-10-CM | POA: Diagnosis not present

## 2017-04-26 ENCOUNTER — Ambulatory Visit (HOSPITAL_COMMUNITY)
Admission: RE | Admit: 2017-04-26 | Discharge: 2017-04-26 | Disposition: A | Discharge status: Home / Self Care | Payer: Medicare Other | Source: Ambulatory Visit | Attending: Internal Medicine | Admitting: Internal Medicine

## 2017-04-26 DIAGNOSIS — E049 Nontoxic goiter, unspecified: Secondary | ICD-10-CM | POA: Insufficient documentation

## 2017-04-26 DIAGNOSIS — R911 Solitary pulmonary nodule: Secondary | ICD-10-CM

## 2017-04-26 DIAGNOSIS — I2510 Atherosclerotic heart disease of native coronary artery without angina pectoris: Secondary | ICD-10-CM | POA: Insufficient documentation

## 2017-04-26 DIAGNOSIS — I700 Atherosclerosis of aorta: Secondary | ICD-10-CM | POA: Diagnosis not present

## 2017-04-26 DIAGNOSIS — I2584 Coronary atherosclerosis due to calcified coronary lesion: Secondary | ICD-10-CM | POA: Insufficient documentation

## 2017-04-26 DIAGNOSIS — R918 Other nonspecific abnormal finding of lung field: Secondary | ICD-10-CM | POA: Insufficient documentation

## 2017-05-16 DIAGNOSIS — E876 Hypokalemia: Secondary | ICD-10-CM | POA: Diagnosis not present

## 2017-05-16 DIAGNOSIS — Z79899 Other long term (current) drug therapy: Secondary | ICD-10-CM | POA: Diagnosis not present

## 2017-05-16 DIAGNOSIS — E119 Type 2 diabetes mellitus without complications: Secondary | ICD-10-CM | POA: Diagnosis not present

## 2017-05-16 DIAGNOSIS — E041 Nontoxic single thyroid nodule: Secondary | ICD-10-CM | POA: Diagnosis not present

## 2017-05-16 DIAGNOSIS — I10 Essential (primary) hypertension: Secondary | ICD-10-CM | POA: Diagnosis not present

## 2017-05-19 DIAGNOSIS — Z9849 Cataract extraction status, unspecified eye: Secondary | ICD-10-CM | POA: Diagnosis not present

## 2017-05-19 DIAGNOSIS — E113293 Type 2 diabetes mellitus with mild nonproliferative diabetic retinopathy without macular edema, bilateral: Secondary | ICD-10-CM | POA: Diagnosis not present

## 2017-05-23 DIAGNOSIS — I700 Atherosclerosis of aorta: Secondary | ICD-10-CM | POA: Diagnosis not present

## 2017-05-23 DIAGNOSIS — R911 Solitary pulmonary nodule: Secondary | ICD-10-CM | POA: Diagnosis not present

## 2017-05-23 DIAGNOSIS — E119 Type 2 diabetes mellitus without complications: Secondary | ICD-10-CM | POA: Diagnosis not present

## 2017-05-23 DIAGNOSIS — E871 Hypo-osmolality and hyponatremia: Secondary | ICD-10-CM | POA: Diagnosis not present

## 2017-06-02 ENCOUNTER — Other Ambulatory Visit (INDEPENDENT_AMBULATORY_CARE_PROVIDER_SITE_OTHER): Payer: Medicare Other

## 2017-06-02 ENCOUNTER — Ambulatory Visit (INDEPENDENT_AMBULATORY_CARE_PROVIDER_SITE_OTHER): Payer: Medicare Other | Admitting: Internal Medicine

## 2017-06-02 ENCOUNTER — Encounter: Payer: Self-pay | Admitting: Internal Medicine

## 2017-06-02 ENCOUNTER — Ambulatory Visit (INDEPENDENT_AMBULATORY_CARE_PROVIDER_SITE_OTHER)
Admission: RE | Admit: 2017-06-02 | Discharge: 2017-06-02 | Disposition: A | Discharge status: Home / Self Care | Payer: Medicare Other | Source: Ambulatory Visit | Attending: Internal Medicine | Admitting: Internal Medicine

## 2017-06-02 VITALS — BP 134/80 | HR 56 | Ht 60.0 in | Wt 152.0 lb

## 2017-06-02 DIAGNOSIS — L218 Other seborrheic dermatitis: Secondary | ICD-10-CM | POA: Diagnosis not present

## 2017-06-02 DIAGNOSIS — R918 Other nonspecific abnormal finding of lung field: Secondary | ICD-10-CM

## 2017-06-02 DIAGNOSIS — L814 Other melanin hyperpigmentation: Secondary | ICD-10-CM | POA: Diagnosis not present

## 2017-06-02 DIAGNOSIS — R05 Cough: Secondary | ICD-10-CM | POA: Diagnosis not present

## 2017-06-02 DIAGNOSIS — R058 Other specified cough: Secondary | ICD-10-CM

## 2017-06-02 DIAGNOSIS — L821 Other seborrheic keratosis: Secondary | ICD-10-CM | POA: Diagnosis not present

## 2017-06-02 DIAGNOSIS — L820 Inflamed seborrheic keratosis: Secondary | ICD-10-CM | POA: Diagnosis not present

## 2017-06-02 LAB — CBC WITH DIFFERENTIAL/PLATELET
BASOS ABS: 0 10*3/uL (ref 0.0–0.1)
Basophils Relative: 0.7 % (ref 0.0–3.0)
Eosinophils Absolute: 0.2 10*3/uL (ref 0.0–0.7)
Eosinophils Relative: 3.8 % (ref 0.0–5.0)
HCT: 40.2 % (ref 36.0–46.0)
Hemoglobin: 13.3 g/dL (ref 12.0–15.0)
LYMPHS ABS: 1.5 10*3/uL (ref 0.7–4.0)
Lymphocytes Relative: 25.2 % (ref 12.0–46.0)
MCHC: 33.2 g/dL (ref 30.0–36.0)
MCV: 88.6 fl (ref 78.0–100.0)
MONO ABS: 0.7 10*3/uL (ref 0.1–1.0)
MONOS PCT: 11.3 % (ref 3.0–12.0)
NEUTROS ABS: 3.5 10*3/uL (ref 1.4–7.7)
Neutrophils Relative %: 59 % (ref 43.0–77.0)
PLATELETS: 241 10*3/uL (ref 150.0–400.0)
RBC: 4.54 Mil/uL (ref 3.87–5.11)
RDW: 14.7 % (ref 11.5–15.5)
WBC: 6 10*3/uL (ref 4.0–10.5)

## 2017-06-02 LAB — SEDIMENTATION RATE: SED RATE: 29 mm/h (ref 0–30)

## 2017-06-02 MED ORDER — PANTOPRAZOLE SODIUM 40 MG PO TBEC: 40.0000 mg | DELAYED_RELEASE_TABLET | Freq: Every day | ORAL | 2 refills | Status: DC

## 2017-06-02 NOTE — Progress Notes (Signed)
Subjective:     Patient ID: Terri Mccall, female   DOB: Jun 27, 1934,    MRN: [id number]  HPI  11 yowf never regular smoker quit completely early 20s with  Cough and abn CT chest >  referred to pulmonary clinic 06/02/2017 by Dr   Willey Blade   06/02/2017 1st [facility] office visit/ Icelynn Onken   Chief Complaint  Patient presents with  . Pulmonary Consult    Referred by Dr. Asencion Noble for eval of pulmonary nodules. Pt c/o cough off and on for the past year. She states that she gets "tickle" in her throat.   dry cough indolent onset  is variably severe  x one year but more present than not x one month like a tickle in her throat  Not noct/ doesn not wake her or flare in early am hours   Better p clears throat / not really responsive to otc's including zyrtec  Not limited by breathing from desired activities     No obvious day to day or daytime variability or assoc excess/ purulent sputum or mucus plugs or hemoptysis or cp or chest tightness, subjective wheeze or overt sinus or hb symptoms. No unusual exp hx or h/o childhood pna/ asthma or knowledge of premature birth.  Sleeping ok without nocturnal  or early am exacerbation  of respiratory  c/o's or need for noct saba. Also denies any obvious fluctuation of symptoms with weather or environmental changes or other aggravating or alleviating factors except as outlined above   Current Medications, Allergies, Complete Past Medical History, Past Surgical History, Family History, and Social History were reviewed in [employer] record.  ROS  The following are not active complaints unless bolded sore throat, dysphagia, dental problems, itching, sneezing,  nasal congestion or excess/ purulent secretions, ear ache,   fever, chills, sweats, unintended wt loss, classically pleuritic or exertional cp,  orthopnea pnd or leg swelling, presyncope, palpitations, abdominal pain, anorexia, nausea, vomiting, diarrhea  or change in bowel or bladder  habits, change in stools or urine, dysuria,hematuria,  rash, arthralgias, visual complaints, headache, numbness, weakness or ataxia or problems with walking or coordination,  change in mood/affect or memory.             Review of Systems     Objective:   Physical Exam  amb wf nad  Wt Readings from Last 3 Encounters:  06/02/17 152 lb (68.9 kg)  09/29/16 156 lb 6.4 oz (70.9 kg)  09/09/14 150 lb (68 kg)    Vital signs reviewed - Note on arrival 02 sats  96% on RA      HEENT: nl dentition, turbinates bilaterally, and oropharynx. Nl external ear canals without cough reflex   NECK :  without JVD/Nodes/TM/ nl carotid upstrokes bilaterally   LUNGS: no acc muscle use,  Nl contour chest which is clear to A and P bilaterally without cough on insp or exp maneuvers   CV:  RRR  no s3 or murmur or increase in P2, and  Trace R > L pedal edema   ABD:  soft and nontender with nl inspiratory excursion in the supine position. No bruits or organomegaly appreciated, bowel sounds nl  MS:  Nl gait/ ext warm without deformities, calf tenderness, cyanosis or clubbing No obvious joint restrictions   SKIN: warm and dry without lesions    NEURO:  alert, approp, nl sensorium with  no motor or cerebellar deficits apparent.    CXR PA and Lateral:   06/02/2017 :  I personally reviewed images and agree with radiology impression as follows:    The heart size and mediastinal contours are within normal limits. The lungs are free of infiltrates or edema. No osseous findings.  MR Brain 05/2016  Paranasal sinuses clear.   Labs ordered/ reviewed:   Lab Results  Component Value Date   WBC 6.0 06/02/2017   HGB 13.3 06/02/2017   HCT 40.2 06/02/2017   MCV 88.6 06/02/2017   PLT 241.0 06/02/2017        EOS                      0.2                                        06/02/2017         Labs ordered 06/02/2017    Allergy profile/ [facility]   Lab Results  Component Value Date   ESRSEDRATE 29  06/02/2017          Assessment:

## 2017-06-02 NOTE — Patient Instructions (Addendum)
Pantoprazole (protonix) 40 mg   Take  30-60 min before first meal of the day and Pepcid (famotidine)  20 mg one @  bedtime until return to office - this is the best way to tell whether stomach acid is contributing to your problem.      GERD (REFLUX)  is an extremely common cause of respiratory symptoms just like yours , many times with no obvious heartburn at all.    It can be treated with medication, but also with lifestyle changes including elevation of the head of your bed (ideally with 6 inch  bed blocks),  Smoking cessation, avoidance of late meals, excessive alcohol, and avoid fatty foods, chocolate, peppermint, colas, red wine, and acidic juices such as orange juice.  NO MINT OR MENTHOL PRODUCTS SO NO COUGH DROPS   USE SUGARLESS CANDY INSTEAD (Jolley ranchers or Stover's or Life Savers) or even ice chips will also do - the key is to swallow to prevent all throat clearing. NO OIL BASED VITAMINS - use powdered substitutes - NO FISH OIL     Please remember to go to the lab and x-ray department downstairs in the basement  for your tests - we will call you with the results when they are available.   Please schedule a follow up office visit in 6 weeks, call sooner if needed  If not better needs sinus ct prior to next ov

## 2017-06-03 ENCOUNTER — Encounter: Payer: Self-pay | Admitting: Internal Medicine

## 2017-06-03 DIAGNOSIS — R058 Other specified cough: Secondary | ICD-10-CM | POA: Insufficient documentation

## 2017-06-03 DIAGNOSIS — R05 Cough: Secondary | ICD-10-CM | POA: Insufficient documentation

## 2017-06-03 NOTE — Assessment & Plan Note (Addendum)
CT 04/26/17 -1. Numerous bilateral pulmonary nodules ranging in size from 2 mm up to 1.9 cm. Several of the nodules are associated with tubular opacities likely representing impacted bronchials, as well as regions of tree-in-bud micro nodularity - note most accessible for bx = RUL apical segment  - CXR 06/02/2017 wnl/ esr only 29    This is an extremely common benign condition in the elderly and does not warrant aggressive eval/ rx at this point unless there is a clinical correlation suggesting unaddressed pulmonary infection (purulent sputum, night sweats, unintended wt loss, doe) or evolution of  obvious changes on plain cxr (as opposed to serial CT, which is way over sensitive to make clinical decisions re intervention and treatment in the elderly, who tend to tolerate both dx and treatment poorly) .   Discussed in detail all the  indications, usual  risks and alternatives  relative to the benefits with patient who agrees to proceed with conservative f/u to see if can eliminate symptoms with rx for UACS then return to consider FOB.     Total time devoted to counseling  > 50 % of initial 60 min office visit:  review case with pt/ discussion of options/alternatives/ personally creating written customized instructions  in presence of pt  then going over those specific  Instructions directly with the pt including how to use all of the meds but in particular covering each new medication in detail and the difference between the maintenance= "automatic" meds and the prns using an action plan format for the latter (If this problem/symptom => do that organization reading Left to right).  Please see AVS from this visit for a full list of these instructions which I personally wrote for this pt and  are unique to this visit.

## 2017-06-03 NOTE — Assessment & Plan Note (Signed)
The most common causes of chronic cough in immunocompetent adults include the following: upper airway cough syndrome (UACS), previously referred to as postnasal drip syndrome (PNDS), which is caused by variety of rhinosinus conditions; (2) asthma; (3) GERD; (4) chronic bronchitis from cigarette smoking or other inhaled environmental irritants; (5) nonasthmatic eosinophilic bronchitis; and (6) bronchiectasis.   These conditions, singly or in combination, have accounted for up to 94% of the causes of chronic cough in prospective studies.   Other conditions have constituted no >6% of the causes in prospective studies These have included bronchogenic carcinoma, chronic interstitial pneumonia, sarcoidosis, left ventricular failure, ACEI-induced cough, and aspiration from a condition associated with pharyngeal dysfunction.    Chronic cough is often simultaneously caused by more than one condition. A single cause has been found from 38 to 82% of the time, multiple causes from 18 to 62%. Multiply caused cough has been the result of three diseases up to 42% of the time.   The lack of sputum production and daytime urge to clear throat are most c/w UACS so w/u is allergy eval and possible sinus CT if not improving on max rx for gerd/pnds  before considering lung intervention like fob/ bx   see avs for instructions unique to this ov

## 2017-06-05 ENCOUNTER — Telehealth: Payer: Self-pay | Admitting: *Deleted

## 2017-06-05 LAB — RESPIRATORY ALLERGY PROFILE REGION II ~~LOC~~
Allergen, A. alternata, m6: <0.1 kU/L
Allergen, C. Herbarum, M2: <0.1 kU/L
Allergen, Cedar tree, t12: <0.1 kU/L
Allergen, Comm Silver Birch, t9: <0.1 kU/L
Allergen, Mouse Urine Protein, e78: <0.1 kU/L
Allergen, Mulberry, t76: <0.1 kU/L
Allergen, P. notatum, m1: <0.1 kU/L
Aspergillus fumigatus, m3: <0.1 kU/L
Box Elder IgE: <0.1 kU/L
Cockroach: <0.1 kU/L
Common Ragweed: <0.1 kU/L
IgE (Immunoglobulin E), Serum: 12 kU/L (ref <115)
Rough Pigweed  IgE: <0.1 kU/L
Timothy Grass: <0.1 kU/L

## 2017-06-05 NOTE — Telephone Encounter (Signed)
Spoke with pt and notified of results per Dr. Melvyn Novas. Her cough has already improved.She will call if she worsens and feels like the scan is needed.

## 2017-06-05 NOTE — Progress Notes (Signed)
Spoke with pt and notified of results per Dr. Wert. Pt verbalized understanding and denied any questions. 

## 2017-06-05 NOTE — Telephone Encounter (Signed)
-----   Message from Tanda Rockers, MD sent at 06/03/2017  8:44 AM EDT ----- Let her know if cough not better by the time she returns she'll need a sinus ct  prior to ov

## 2017-06-06 NOTE — Progress Notes (Signed)
Called and left a detailed msg with results

## 2017-06-26 DIAGNOSIS — Z1231 Encounter for screening mammogram for malignant neoplasm of breast: Secondary | ICD-10-CM | POA: Diagnosis not present

## 2017-07-17 ENCOUNTER — Ambulatory Visit: Payer: Medicare Other | Admitting: Internal Medicine

## 2017-07-18 ENCOUNTER — Ambulatory Visit (INDEPENDENT_AMBULATORY_CARE_PROVIDER_SITE_OTHER): Payer: Medicare Other | Admitting: Internal Medicine

## 2017-07-18 ENCOUNTER — Other Ambulatory Visit (INDEPENDENT_AMBULATORY_CARE_PROVIDER_SITE_OTHER): Payer: Medicare Other

## 2017-07-18 ENCOUNTER — Encounter: Payer: Self-pay | Admitting: Internal Medicine

## 2017-07-18 VITALS — BP 150/60 | HR 60 | Ht 60.0 in | Wt 145.6 lb

## 2017-07-18 DIAGNOSIS — R918 Other nonspecific abnormal finding of lung field: Secondary | ICD-10-CM | POA: Diagnosis not present

## 2017-07-18 DIAGNOSIS — R05 Cough: Secondary | ICD-10-CM

## 2017-07-18 DIAGNOSIS — R058 Other specified cough: Secondary | ICD-10-CM

## 2017-07-18 LAB — SEDIMENTATION RATE: Sed Rate: 43 mm/hr — ABNORMAL HIGH (ref 0–30)

## 2017-07-18 NOTE — Patient Instructions (Addendum)
Please remember to go to the lab department downstairs in the basement  for your tests - we will call you with the results when they are available.   Please schedule a follow up office visit in 6 weeks, call sooner if needed with cxr on return

## 2017-07-18 NOTE — Progress Notes (Signed)
Subjective:     Patient ID: Terri Mccall, female   DOB: [birth year]    MRN: [id number]    Brief patient profile:  83yowf never regular smoker quit completely early 20s with  Cough and abn CT chest >  referred to pulmonary clinic 06/02/2017 by Dr   Willey Blade   History of Present Illness  06/02/2017 1st [facility] office visit/ Jolynn Bajorek   Chief Complaint  Patient presents with  . Pulmonary Consult    Referred by Dr. Asencion Noble for eval of pulmonary nodules. Pt c/o cough off and on for the past year. She states that she gets "tickle" in her throat.   dry cough indolent onset  is variably severe  x one year but more present than not x one month like a tickle in her throat  Not noct/ does not wake her or flare in early am hours   Better p clears throat / not really responsive to otc's including zyrtec  Not limited by breathing from desired activities   rec Pantoprazole (protonix) 40 mg   Take  30-60 min before first meal of the day and Pepcid (famotidine)  20 mg one @  bedtime until return to office -   GERD diet  If not better needs sinus ct prior to next ov       07/18/2017  f/u ov/Caidynce Muzyka re:  Chronic cough/ diffuse nodular lung dz c/wi MAI  Chief Complaint  Patient presents with  . Follow-up    coughing better 75%,occass. PND, sob with exertion and humidity, no wheezing,denies cp or tightness     Not limited by breathing from desired activities    No obvious day to day or daytime variability or assoc excess/ purulent sputum or mucus plugs or hemoptysis or cp or chest tightness, subjective wheeze or overt sinus or hb symptoms. No unusual exp hx or h/o childhood pna/ asthma or knowledge of premature birth.  Sleeping ok flat without nocturnal  or early am exacerbation  of respiratory  c/o's or need for noct saba. Also denies any obvious fluctuation of symptoms with weather or environmental changes or other aggravating or alleviating factors except as outlined above   Current Allergies,  Complete Past Medical History, Past Surgical History, Family History, and Social History were reviewed in [employer] record.  ROS  The following are not active complaints unless bolded sore throat, dysphagia, dental problems, itching, sneezing,  nasal congestion or disharge of excess mucus or purulent secretions, ear ache,   fever, chills, sweats, unintended wt loss on low fat diet or wt gain, classically pleuritic or exertional cp,  orthopnea pnd or leg swelling, presyncope, palpitations, abdominal pain, anorexia, nausea, vomiting, diarrhea  or change in bowel habits or bladder habits, change in stools or change in urine, dysuria, hematuria,  rash, arthralgias, visual complaints, headache, numbness, weakness or ataxia or problems with walking or coordination,  change in mood/affect or memory.        Current Meds  Medication Sig  . aspirin EC 81 MG tablet Take 81 mg by mouth every evening.  Marland Kitchen atenolol (TENORMIN) 50 MG tablet Take 50 mg by mouth 2 (two) times daily.  . beta carotene w/minerals (OCUVITE) tablet Take 1 tablet by mouth every morning.  . Calcium Carbonate-Vit D-Min (CALCIUM 600+D PLUS MINERALS PO) Take 1 tablet by mouth daily.  . cetirizine (ZYRTEC) 10 MG tablet Take 10 mg by mouth daily.  . chlorthalidone (HYGROTON) 25 MG tablet Take 25 mg by mouth  daily.  . Chromium-Cinnamon (CINNAMON PLUS CHROMIUM) [phone] MCG-MG CAPS Take 1 capsule by mouth daily.  Marland Kitchen CRANBERRY PO Take 300 mg by mouth daily.  . famotidine (PEPCID) 20 MG tablet Take 20 mg by mouth at bedtime.  . fluticasone (FLONASE) 50 MCG/ACT nasal spray Place 2 sprays into both nostrils daily.  Marland Kitchen glucosamine-chondroitin 500-400 MG tablet Take 2 tablets by mouth daily.   Marland Kitchen latanoprost (XALATAN) 0.005 % ophthalmic solution Place 1 drop into both eyes at bedtime.  Marland Kitchen olmesartan (BENICAR) 40 MG tablet Take 40 mg by mouth every morning.  . pantoprazole (PROTONIX) 40 MG tablet Take 1 tablet (40 mg total) by  mouth daily. Take 30-60 min before first meal of the day  . potassium chloride SA (K-DUR,KLOR-CON) 20 MEQ tablet Take 20 mEq by mouth every morning.  Marland Kitchen PREMARIN vaginal cream Place 1 Applicatorful vaginally once a week.  . simvastatin (ZOCOR) 20 MG tablet Take 20 mg by mouth every evening.  . timolol (BETIMOL) 0.25 % ophthalmic solution 1-2 drops daily.                      Objective:   Physical Exam  amb wf nad   07/18/2017       145   06/02/17 152 lb (68.9 kg)  09/29/16 156 lb 6.4 oz (70.9 kg)  09/09/14 150 lb (68 kg)    Vital signs reviewed  - Note on arrival 02 sats  98% on RA       HEENT: nl dentition, turbinates bilaterally, and oropharynx. Nl external ear canals without cough reflex   NECK :  without JVD/Nodes/TM/ nl carotid upstrokes bilaterally   LUNGS: no acc muscle use,  Nl contour chest which is clear to A and P bilaterally without cough on insp or exp maneuvers   CV:  RRR  no s3 or murmur or increase in P2, and  Trace R > L pedal edema   ABD:  soft and nontender with nl inspiratory excursion in the supine position. No bruits or organomegaly appreciated, bowel sounds nl  MS:  Nl gait/ ext warm without deformities, calf tenderness, cyanosis or clubbing No obvious joint restrictions   SKIN: warm and dry without lesions    NEURO:  alert, approp, nl sensorium with  no motor or cerebellar deficits apparent.    CXR PA and Lateral:   06/02/2017 :    I personally reviewed images and agree with radiology impression as follows:    The heart size and mediastinal contours are within normal limits. The lungs are free of infiltrates or edema. No osseous findings.  MR Brain 05/2016  Paranasal sinuses clear.   Labs  reviewed:   Lab Results  Component Value Date   WBC 6.0 06/02/2017   HGB 13.3 06/02/2017   HCT 40.2 06/02/2017   MCV 88.6 06/02/2017   PLT 241.0 06/02/2017        EOS                      0.2                                        06/02/2017              Lab Results  Component Value Date   ESRSEDRATE 43 (H) 07/18/2017   ESRSEDRATE 29 06/02/2017  Assessment:

## 2017-07-20 ENCOUNTER — Encounter: Payer: Self-pay | Admitting: Internal Medicine

## 2017-07-20 LAB — QUANTIFERON TB GOLD ASSAY (BLOOD)
QUANTIFERON TB AG MINUS NIL: 0.05 [IU]/mL
QUANTIFERON(R)-TB GOLD: NEGATIVE
Quantiferon Nil Value: 0.04 IU/mL

## 2017-07-20 NOTE — Assessment & Plan Note (Signed)
Allergy profile 06/02/2017 >  Eos 0.2 /  IgE 12 neg RAST  rx for gerd 06/02/2017 >>> 75% improved 07/18/2017   No change rx for now/ f/u in 6 weeks

## 2017-07-20 NOTE — Assessment & Plan Note (Signed)
CT 04/26/17 -1. Numerous bilateral pulmonary nodules ranging in size from 2 mm up to 1.9 cm. Several of the nodules are associated with tubular opacities likely representing impacted bronchials, as well as regions of tree-in-bud micro nodularity - note most accessible for bx = RUL apical segment  - CXR 06/02/2017 wnl/ esr only 29  But  43 07/18/2017  - [facility] GOLD TB 07/18/2017  Pending    Concerned about the wt loss which she attributes to diet and the esr but note the cough that brought her to medical attention is much better and regardless of dx there is no advantage of early dx in this setting so rec f/u in 6 weeks and consider fob vs navigational bx then   Discussed in detail all the  indications, usual  risks and alternatives  relative to the benefits with patient/husband who agree  to proceed with conservative f/u as outlined     I had an extended discussion with the patient reviewing all relevant studies completed to date and  lasting 15 to 20 minutes of a 25 minute visit    Each maintenance medication was reviewed in detail including most importantly the difference between maintenance and prns and under what circumstances the prns are to be triggered using an action plan format that is not reflected in the computer generated alphabetically organized AVS.    Please see AVS for specific instructions unique to this visit that I personally wrote and verbalized to the the pt in detail and then reviewed with pt  by my nurse highlighting any  changes in therapy recommended at today's visit to their plan of care.

## 2017-07-24 NOTE — Progress Notes (Signed)
Spoke with pt and notified of results per Dr. Wert. Pt verbalized understanding and denied any questions. 

## 2017-08-29 DIAGNOSIS — E113293 Type 2 diabetes mellitus with mild nonproliferative diabetic retinopathy without macular edema, bilateral: Secondary | ICD-10-CM | POA: Diagnosis not present

## 2017-08-29 DIAGNOSIS — H5310 Unspecified subjective visual disturbances: Secondary | ICD-10-CM | POA: Diagnosis not present

## 2017-09-01 ENCOUNTER — Other Ambulatory Visit: Payer: Self-pay | Admitting: Internal Medicine

## 2017-09-01 DIAGNOSIS — R918 Other nonspecific abnormal finding of lung field: Secondary | ICD-10-CM

## 2017-09-05 ENCOUNTER — Ambulatory Visit (INDEPENDENT_AMBULATORY_CARE_PROVIDER_SITE_OTHER)
Admission: RE | Admit: 2017-09-05 | Discharge: 2017-09-05 | Disposition: A | Discharge status: Home / Self Care | Payer: Medicare Other | Source: Ambulatory Visit | Attending: Internal Medicine | Admitting: Internal Medicine

## 2017-09-05 ENCOUNTER — Other Ambulatory Visit (INDEPENDENT_AMBULATORY_CARE_PROVIDER_SITE_OTHER): Payer: Medicare Other

## 2017-09-05 ENCOUNTER — Ambulatory Visit (INDEPENDENT_AMBULATORY_CARE_PROVIDER_SITE_OTHER): Payer: Medicare Other | Admitting: Internal Medicine

## 2017-09-05 ENCOUNTER — Encounter: Payer: Self-pay | Admitting: Internal Medicine

## 2017-09-05 VITALS — BP 114/78 | HR 68 | Ht 60.0 in | Wt 149.0 lb

## 2017-09-05 DIAGNOSIS — R05 Cough: Secondary | ICD-10-CM | POA: Diagnosis not present

## 2017-09-05 DIAGNOSIS — Z0000 Encounter for general adult medical examination without abnormal findings: Secondary | ICD-10-CM | POA: Diagnosis not present

## 2017-09-05 DIAGNOSIS — R058 Other specified cough: Secondary | ICD-10-CM

## 2017-09-05 DIAGNOSIS — R918 Other nonspecific abnormal finding of lung field: Secondary | ICD-10-CM

## 2017-09-05 LAB — CBC WITH DIFFERENTIAL/PLATELET
Basophils Absolute: 0.1 10*3/uL (ref 0.0–0.1)
Basophils Relative: 1 % (ref 0.0–3.0)
Eosinophils Absolute: 0.2 10*3/uL (ref 0.0–0.7)
Eosinophils Relative: 3.1 % (ref 0.0–5.0)
HCT: 38.1 % (ref 36.0–46.0)
Hemoglobin: 12.7 g/dL (ref 12.0–15.0)
Lymphocytes Relative: 20.6 % (ref 12.0–46.0)
Lymphs Abs: 1.5 10*3/uL (ref 0.7–4.0)
MCHC: 33.4 g/dL (ref 30.0–36.0)
MCV: 87.4 fl (ref 78.0–100.0)
Monocytes Absolute: 1 10*3/uL (ref 0.1–1.0)
Monocytes Relative: 13.3 % — ABNORMAL HIGH (ref 3.0–12.0)
Neutro Abs: 4.5 10*3/uL (ref 1.4–7.7)
Neutrophils Relative %: 62 % (ref 43.0–77.0)
Platelets: 210 10*3/uL (ref 150.0–400.0)
RBC: 4.37 Mil/uL (ref 3.87–5.11)
RDW: 14.6 % (ref 11.5–15.5)
WBC: 7.2 10*3/uL (ref 4.0–10.5)

## 2017-09-05 LAB — SEDIMENTATION RATE: SED RATE: 45 mm/h — AB (ref 0–30)

## 2017-09-05 NOTE — Progress Notes (Signed)
LMTCB

## 2017-09-05 NOTE — Progress Notes (Signed)
Subjective:     Patient ID: Terri Mccall, female   DOB: January 30, 1934    MRN: [id number]    Brief patient profile:  83yowf never regular smoker quit completely early 20s with  Cough and abn CT chest >  referred to pulmonary clinic 06/02/2017 by Dr   Willey Blade   History of Present Illness  06/02/2017 [facility] office visit/ Terri Mccall   Chief Complaint  Patient presents with  . Pulmonary Consult    Referred by Dr. Asencion Noble for eval of pulmonary nodules. Pt c/o cough off and on for the past year. She states that she gets "tickle" in her throat.   dry cough indolent onset  is variably severe  x one year but more present than not x one month like a tickle in her throat  Not noct/ does not wake her or flare in early am hours   Better p clears throat / not really responsive to otc's including zyrtec  Not limited by breathing from desired activities   rec Pantoprazole (protonix) 40 mg   Take  30-60 min before first meal of the day and Pepcid (famotidine)  20 mg one @  bedtime until return to office -   GERD diet  If not better needs sinus ct prior to next ov       07/18/2017  f/u ov/Terri Mccall re:  Chronic cough/ diffuse nodular lung dz c/wi MAI  Chief Complaint  Patient presents with  . Follow-up    coughing better 75%,occass. PND, sob with exertion and humidity, no wheezing,denies cp or tightness  Not limited by breathing from desired activities  rec Please remember to go to the lab department downstairs in the basement  for your tests - we will call you with the results when they are available.    09/05/2017  f/u ov/Terri Mccall re: cough and non-specific nodular changes RUL  Chief Complaint  Patient presents with  . Follow-up    PT had CXR prior to visit today.   cough x one year with breathing is clearly better on gerd rx  / worse cough p exp to mint  Not limited by breathing from desired activities  And no fevers chills sweats or unintended weight loss.   No obvious day to day or daytime  variability or assoc excess/ purulent sputum or mucus plugs or hemoptysis or cp or chest tightness, subjective wheeze or overt sinus or hb symptoms. No unusual exp hx or h/o childhood pna/ asthma or knowledge of premature birth.  Sleeping ok flat without nocturnal  or early am exacerbation  of respiratory  c/o's or need for noct saba. Also denies any obvious fluctuation of symptoms with weather or environmental changes or other aggravating or alleviating factors except as outlined above   Current Allergies, Complete Past Medical History, Past Surgical History, Family History, and Social History were reviewed in [employer] record.  ROS  The following are not active complaints unless bolded Hoarseness, sore throat, dysphagia, dental problems, itching, sneezing,  nasal congestion or discharge of excess mucus or purulent secretions, ear ache,   fever, chills, sweats, unintended wt loss or wt gain, classically pleuritic or exertional cp,  orthopnea pnd or leg swelling, presyncope, palpitations, abdominal pain, anorexia, nausea, vomiting, diarrhea  or change in bowel habits or change in bladder habits, change in stools or change in urine, dysuria, hematuria,  rash, arthralgias, visual complaints, headache, numbness, weakness or ataxia or problems with walking or coordination,  change in mood/affect  or memory.        Current Meds  Medication Sig  . aspirin EC 81 MG tablet Take 81 mg by mouth every evening.  Marland Kitchen atenolol (TENORMIN) 50 MG tablet Take 50 mg by mouth 2 (two) times daily.  . beta carotene w/minerals (OCUVITE) tablet Take 1 tablet by mouth every morning.  . Calcium Carbonate-Vit D-Min (CALCIUM 600+D PLUS MINERALS PO) Take 1 tablet by mouth daily.  . cetirizine (ZYRTEC) 10 MG tablet Take 10 mg by mouth daily.  . chlorthalidone (HYGROTON) 25 MG tablet Take 25 mg by mouth daily.  . Chromium-Cinnamon (CINNAMON PLUS CHROMIUM) [phone] MCG-MG CAPS Take 1 capsule by mouth daily.   Marland Kitchen CRANBERRY PO Take 300 mg by mouth daily.  . famotidine (PEPCID) 20 MG tablet Take 20 mg by mouth at bedtime.  . fluticasone (FLONASE) 50 MCG/ACT nasal spray Place 2 sprays into both nostrils daily.  Marland Kitchen glucosamine-chondroitin 500-400 MG tablet Take 2 tablets by mouth daily.   Marland Kitchen latanoprost (XALATAN) 0.005 % ophthalmic solution Place 1 drop into both eyes at bedtime.  Marland Kitchen olmesartan (BENICAR) 40 MG tablet Take 40 mg by mouth every morning.  . pantoprazole (PROTONIX) 40 MG tablet TAKE 1 TABLET BY MOUTH ONCE DAILY 30 TO 60 MINUTES BEFORE FIRST MEAL OF THE DAY.  Marland Kitchen potassium chloride SA (K-DUR,KLOR-CON) 20 MEQ tablet Take 20 mEq by mouth every morning.  Marland Kitchen PREMARIN vaginal cream Place 1 Applicatorful vaginally once a week.  . simvastatin (ZOCOR) 20 MG tablet Take 20 mg by mouth every evening.  . timolol (BETIMOL) 0.25 % ophthalmic solution 1-2 drops daily.                    Objective:   Physical Exam  amb wf nad  09/05/2017     149   07/18/2017       145   06/02/17 152 lb (68.9 kg)  09/29/16 156 lb 6.4 oz (70.9 kg)  09/09/14 150 lb (68 kg)    Vital signs reviewed  - Note on arrival 02 sats  98% on RA       HEENT: nl dentition, turbinates bilaterally, and oropharynx. Nl external ear canals without cough reflex   NECK :  without JVD/Nodes/TM/ nl carotid upstrokes bilaterally   LUNGS: no acc muscle use,  Nl contour chest which is clear to A and P bilaterally without cough on insp or exp maneuvers   CV:  RRR  no s3 or murmur or increase in P2, and  Min R > L pedal edema   ABD:  soft and nontender with nl inspiratory excursion in the supine position. No bruits or organomegaly appreciated, bowel sounds nl  MS:  Nl gait/ ext warm without deformities, calf tenderness, cyanosis or clubbing No obvious joint restrictions   SKIN: warm and dry without lesions    NEURO:  alert, approp, nl sensorium with  no motor or cerebellar deficits apparent.      MR Brain 05/2016  Paranasal  sinuses clear.    CXR PA and Lateral:   09/05/2017 :    I personally reviewed images and agree with radiology impression as follows:   No acute cardiopulmonary process.        Lab Results  Component Value Date   ESRSEDRATE 45 (H) 09/05/2017   ESRSEDRATE 43 (H) 07/18/2017   ESRSEDRATE 29 06/02/2017             Assessment:

## 2017-09-05 NOTE — Patient Instructions (Signed)
Please remember to go to the lab department downstairs in the basement  for your tests - we will call you with the results when they are available.      Please schedule a follow up visit in 3 months but call sooner if needed with cxr on return

## 2017-09-06 ENCOUNTER — Telehealth: Payer: Self-pay | Admitting: Internal Medicine

## 2017-09-06 NOTE — Assessment & Plan Note (Addendum)
Allergy profile 06/02/2017 >  Eos 0.2 /  IgE 12 neg RAST  rx for gerd 06/02/2017 >>> 75% improved 07/18/2017 > nearly 100% by 09/05/2017   She has done convincingly well over the last 6 weeks and note when she was exposed to me that she had a coughing fit so I do strongly feel that occult reflux/cyclical cough from reflux were responsible for her chronic cough and that the findings in the right upper lobe or simply incidental and asymptomatic but time will tell whether that is true or not.

## 2017-09-06 NOTE — Telephone Encounter (Signed)
Sedimentation rate  Order: [id number]  Status:  Final result Visible to patient:  No (Not Released) Dx:  Pulmonary infiltrates  Notes recorded by Rosana Berger, CMA on 09/05/2017 at 2:43 PM EDT [facility] ------  Notes recorded by Tanda Rockers, MD on 09/05/2017 at 1:07 PM EDT Call patient : Studies are unremarkable, no change in recs   Spoke with pt and notified of results per Dr. Melvyn Novas. Pt verbalized understanding and denied any questions.

## 2017-09-06 NOTE — Progress Notes (Signed)
Spoke with pt and notified of results per Dr. Wert. Pt verbalized understanding and denied any questions. 

## 2017-09-06 NOTE — Assessment & Plan Note (Signed)
CT 04/26/17 -1. Numerous bilateral pulmonary nodules ranging in size from 2 mm up to 1.9 cm. Several of the nodules are associated with tubular opacities likely representing impacted bronchials, as well as regions of tree-in-bud micro nodularity - note most accessible for bx = RUL apical segment  - CXR 06/02/2017 wnl/ esr only 29  But  43 07/18/2017  - Quantiferon GOLD TB 07/18/2017 :  Neg - ESR 09/05/2017  =  45    She is doing really well clinically and the sedimentation rate though slightly elevated is nonspecific. She could very well have lingering "microscopic" nodular changes in the right upper lobe we can't see on plain film but even if we found out she had low-grade MAI there would be no indication to treat her at this point based on risk benefit. Therefore is reasonable just to follow her clinically and with plain x-ray rather than CT scan given the oversensitivity of the latter.  Discussed in detail all the  indications, usual  risks and alternatives  relative to the benefits with patient and husband who agree  to proceed with conservative f/u as outlined     I had an extended discussion with the patient reviewing all relevant studies completed to date and  lasting 15 to 20 minutes of a 25 minute visit    Each maintenance medication was reviewed in detail including most importantly the difference between maintenance and prns and under what circumstances the prns are to be triggered using an action plan format that is not reflected in the computer generated alphabetically organized AVS.    Please see AVS for specific instructions unique to this visit that I personally wrote and verbalized to the the pt in detail and then reviewed with pt  by my nurse highlighting any  changes in therapy recommended at today's visit to their plan of care.

## 2017-09-10 IMAGING — CT CT ABD-PELV W/ CM
2 of 5 series · 15 of 46 positions shown, 17 images · IV contrast (APPLIED)
Comparison: None.

CLINICAL DATA: Abdominal pain

EXAM:
CT ABDOMEN AND PELVIS WITH CONTRAST
TECHNIQUE: Multidetector CT imaging of the abdomen and pelvis was performed
using the standard protocol following bolus administration of
intravenous contrast. Oral contrast was also administered.
CONTRAST:  100mL [id number] IOPAMIDOL ([id number]) INJECTION 61%

[Series 2: axial st · axial · 0.74mm/px · z∈[-370,+20]mm · 12 of 90 slices shown, 14 images]
[im 6/90  soft-tissue]
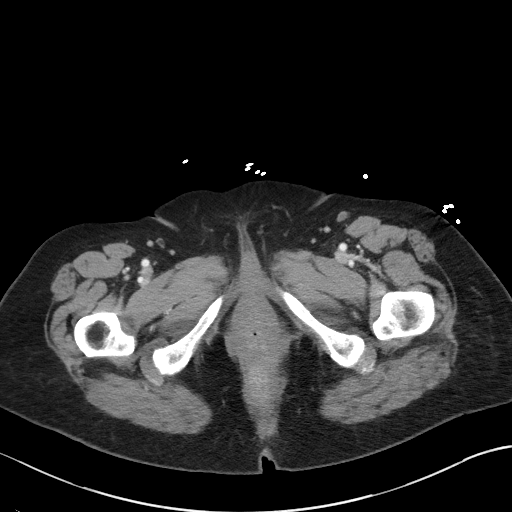
[im 6/90  bone]
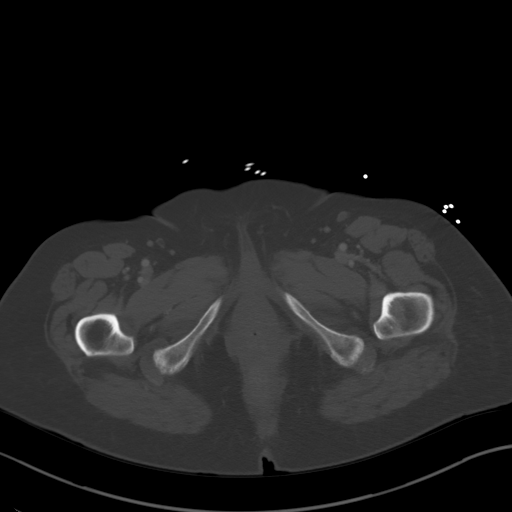
[im 12/90  soft-tissue]
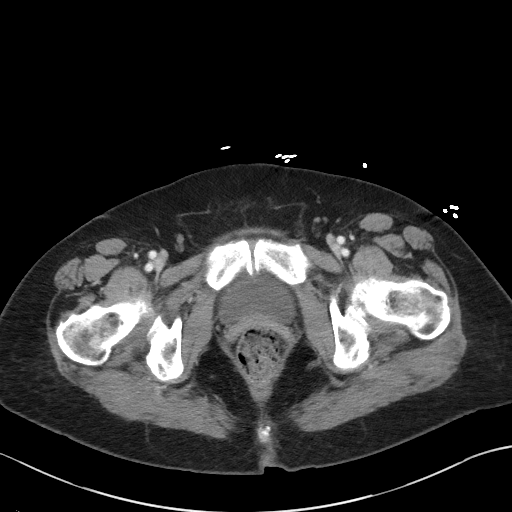
[im 23/90  soft-tissue]
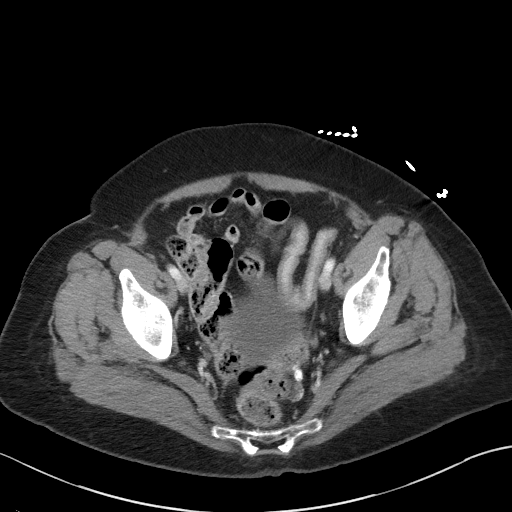
[im 28/90  soft-tissue]
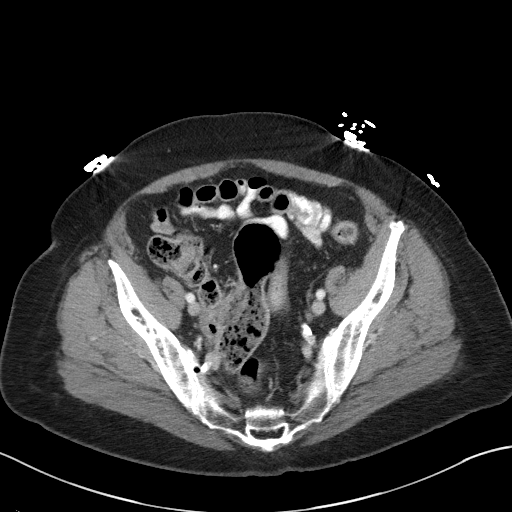
[im 34/90  soft-tissue]
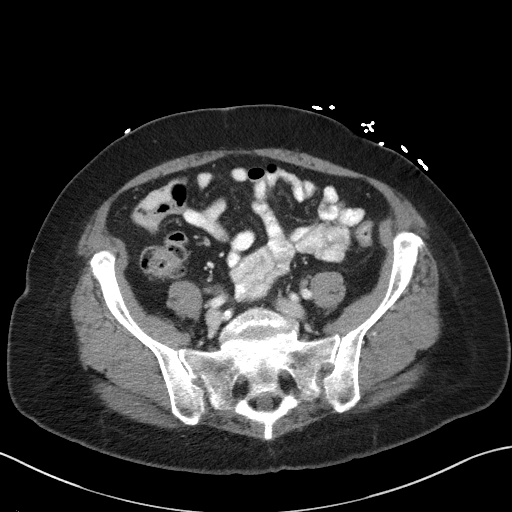
[im 39/90  soft-tissue]
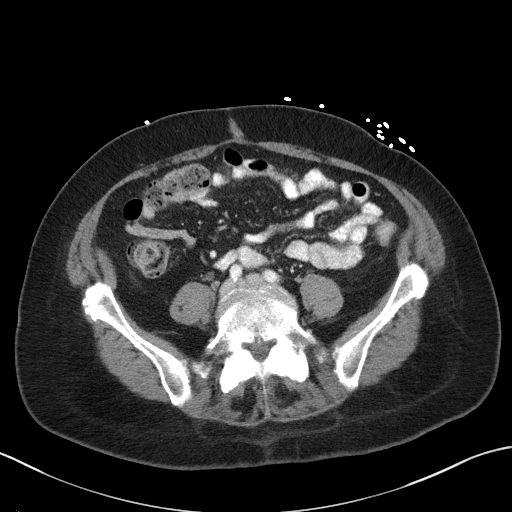
[im 51/90  soft-tissue]
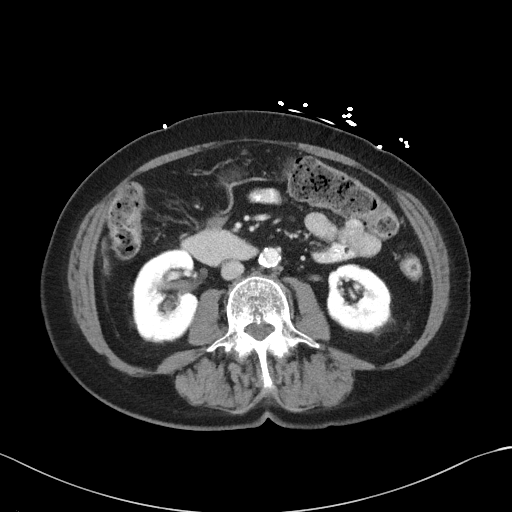
[im 56/90  soft-tissue]
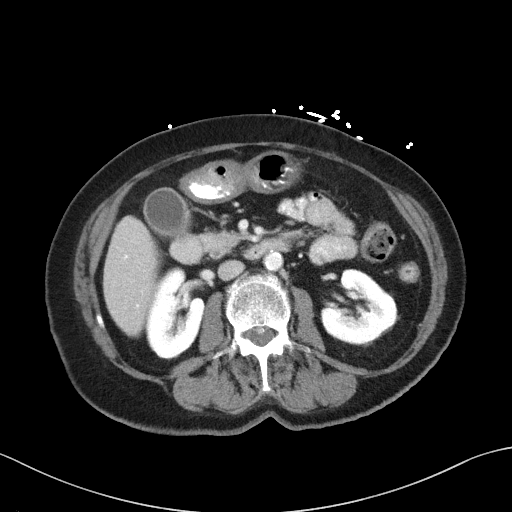
[im 62/90  soft-tissue]
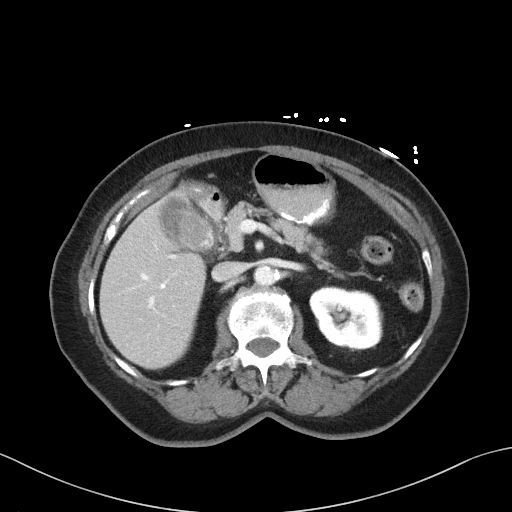
[im 62/90  bone]
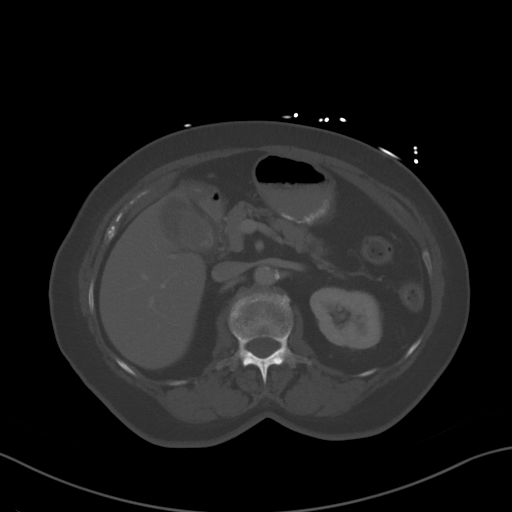
[im 67/90  soft-tissue]
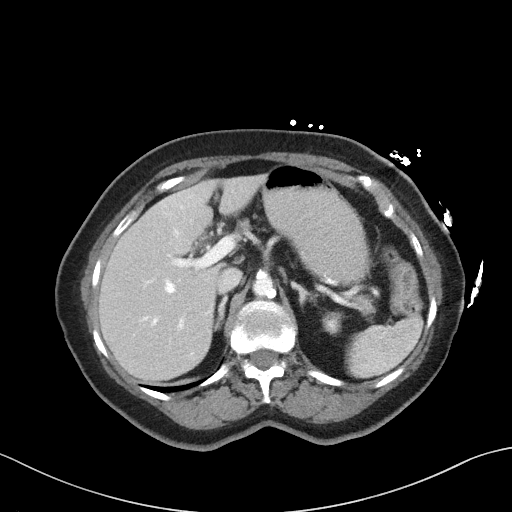
[im 78/90  soft-tissue]
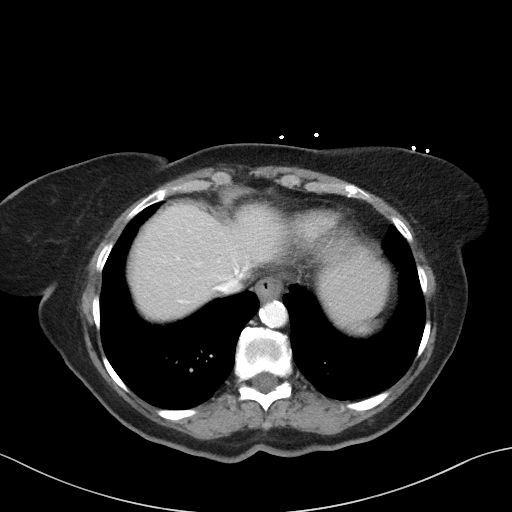
[im 84/90  soft-tissue]
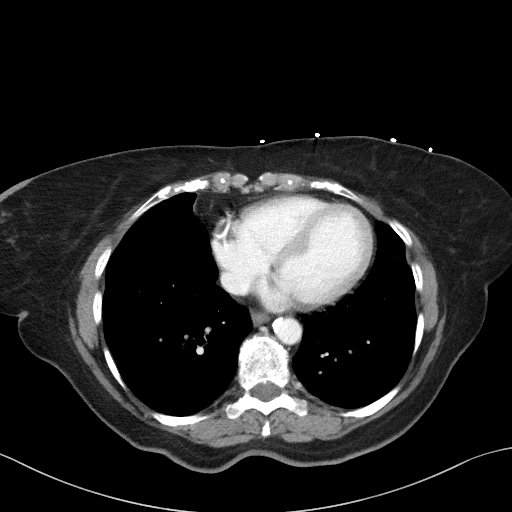

[Series 4: coronal st · coronal · 0.78mm/px · 3 of 84 slices shown]
[im 28/84  soft-tissue]
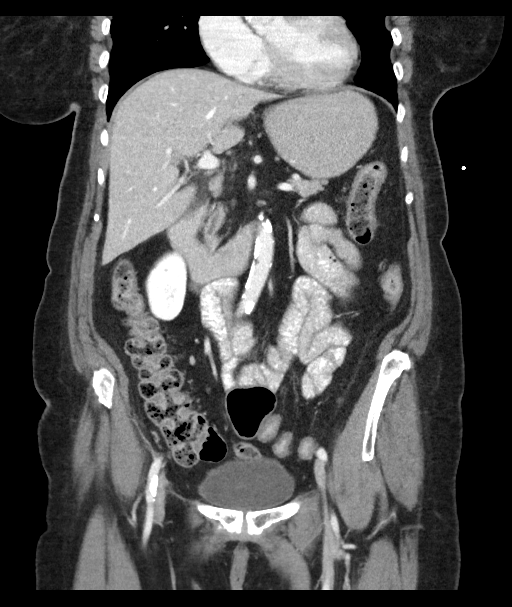
[im 37/84  soft-tissue]
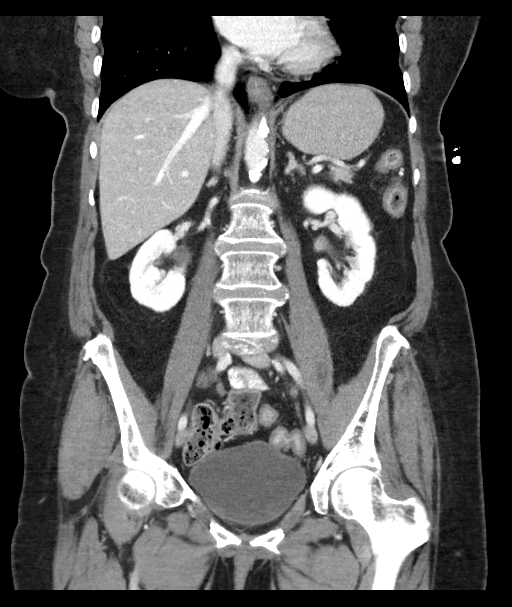
[im 47/84  soft-tissue]
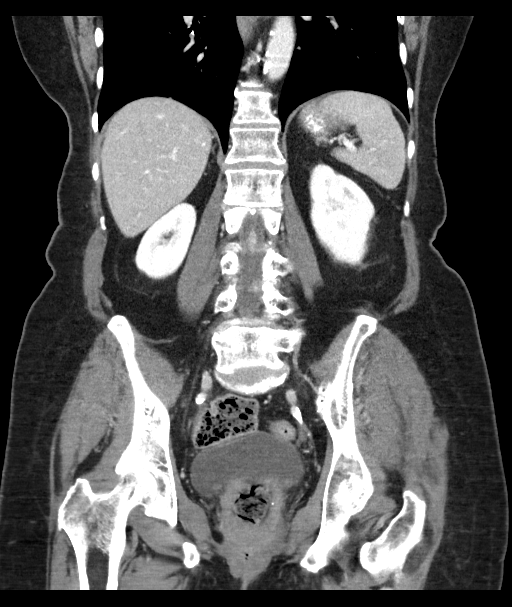

[15 of 46 positions shown; findings below may reference images not displayed]

FINDINGS: Lower chest: There are areas of patchy opacity in the lateral
segment right middle lobe and inferior lingula, concerning for mild
pneumonia in these areas. There is a nodular opacity in the medial
segment of the right middle lobe measuring 6 mm seen on axial slice
4 series 6.

Hepatobiliary: No focal liver lesions are evident. Within the
gallbladder, there is cholelithiasis. There is gallbladder wall
thickening pericholecystic fluid. There is no appreciable biliary
duct dilatation.

Pancreas: There is no pancreatic mass or inflammatory focus.

Spleen: No splenic lesions are evident.

Adrenals/Urinary Tract: Adrenals appear normal bilaterally. There is
a 4 mm cyst in the upper pole the right kidney posteriorly. There is
borderline fullness of the right renal collecting system. There is
no similar fullness of the left renal collecting system. There is no
renal or ureteral calculus on either side. Urinary bladder is
midline with wall thickness within normal limits.

Stomach/Bowel: Rectum is distended with stool. There is fairly
diffuse stool throughout colon. There is no bowel wall or mesenteric
thickening. There is no evident bowel obstruction. No free air or
portal venous air.

Vascular/Lymphatic: There is atherosclerotic calcification in the
aorta. No aneurysm seen. Major mesenteric vessels appear patent.
There is no evident adenopathy in the abdomen or pelvis.

Reproductive: Uterus is absent. There is no appreciable pelvic mass
or pelvic fluid collection.

Other: Appendix is not appreciable. There is no periappendiceal
region inflammation. There is a ascites or abscess in the abdomen or
pelvis.

Musculoskeletal: There is degenerative change in the lumbar spine.
There are no blastic or lytic bone lesions. There is no
intramuscular or abdominal wall lesion.
IMPRESSION: Findings indicative of acute cholecystitis. Specifically, there is
cholelithiasis gallbladder wall thickening and pericholecystic
fluid. There is no appreciable biliary duct dilatation.

Patchy opacity in the right middle lobe and inferior lingula
suggests early pneumonia. There is also a 6 mm nodular opacity in
the right middle lobe. Non-contrast chest CT at 6-12 months is
recommended. If the nodule is stable at time of repeat CT, then
future CT at 18-24 months (from today's scan) is considered optional
for low-risk patients, but is recommended for high-risk patients.
This recommendation follows the consensus statement: Guidelines for
Management of Incidental Pulmonary Nodules Detected on CT Images:

Fairly diffuse stool throughout colon. No bowel obstruction. No
abscess. No periappendiceal region inflammation.

Slight fullness of the right collecting system without renal or
ureteral calculus. This finding is of uncertain etiology. The
patient potentially could have passed a calculus recently on the
right. Pyelonephritis could present in this manner as well. Note
that there is no evidence of renal abscess or perinephric
stranding/fluid.

Aortic atherosclerosis.

Degenerative change throughout the lumbar spine.

Uterus absent.

## 2017-09-18 DIAGNOSIS — I10 Essential (primary) hypertension: Secondary | ICD-10-CM | POA: Diagnosis not present

## 2017-09-18 DIAGNOSIS — E119 Type 2 diabetes mellitus without complications: Secondary | ICD-10-CM | POA: Diagnosis not present

## 2017-09-18 DIAGNOSIS — E876 Hypokalemia: Secondary | ICD-10-CM | POA: Diagnosis not present

## 2017-09-18 DIAGNOSIS — Z79899 Other long term (current) drug therapy: Secondary | ICD-10-CM | POA: Diagnosis not present

## 2017-09-18 DIAGNOSIS — E785 Hyperlipidemia, unspecified: Secondary | ICD-10-CM | POA: Diagnosis not present

## 2017-09-25 DIAGNOSIS — I10 Essential (primary) hypertension: Secondary | ICD-10-CM | POA: Diagnosis not present

## 2017-09-25 DIAGNOSIS — I700 Atherosclerosis of aorta: Secondary | ICD-10-CM | POA: Diagnosis not present

## 2017-09-25 DIAGNOSIS — Z0001 Encounter for general adult medical examination with abnormal findings: Secondary | ICD-10-CM | POA: Diagnosis not present

## 2017-09-25 DIAGNOSIS — E1129 Type 2 diabetes mellitus with other diabetic kidney complication: Secondary | ICD-10-CM | POA: Diagnosis not present

## 2017-11-29 DIAGNOSIS — R200 Anesthesia of skin: Secondary | ICD-10-CM | POA: Insufficient documentation

## 2017-11-29 DIAGNOSIS — R52 Pain, unspecified: Secondary | ICD-10-CM | POA: Insufficient documentation

## 2017-11-29 DIAGNOSIS — M542 Cervicalgia: Secondary | ICD-10-CM | POA: Diagnosis not present

## 2017-11-29 DIAGNOSIS — M1812 Unilateral primary osteoarthritis of first carpometacarpal joint, left hand: Secondary | ICD-10-CM | POA: Diagnosis not present

## 2017-12-05 DIAGNOSIS — R52 Pain, unspecified: Secondary | ICD-10-CM | POA: Diagnosis not present

## 2017-12-05 DIAGNOSIS — M79645 Pain in left finger(s): Secondary | ICD-10-CM | POA: Diagnosis not present

## 2017-12-05 DIAGNOSIS — Z9889 Other specified postprocedural states: Secondary | ICD-10-CM | POA: Diagnosis not present

## 2017-12-05 DIAGNOSIS — G5602 Carpal tunnel syndrome, left upper limb: Secondary | ICD-10-CM | POA: Diagnosis not present

## 2017-12-05 DIAGNOSIS — M25542 Pain in joints of left hand: Secondary | ICD-10-CM | POA: Insufficient documentation

## 2017-12-06 DIAGNOSIS — G5602 Carpal tunnel syndrome, left upper limb: Secondary | ICD-10-CM | POA: Diagnosis not present

## 2017-12-07 ENCOUNTER — Ambulatory Visit: Payer: Medicare Other | Admitting: Internal Medicine

## 2017-12-07 ENCOUNTER — Encounter: Payer: Self-pay | Admitting: Internal Medicine

## 2017-12-07 ENCOUNTER — Ambulatory Visit (INDEPENDENT_AMBULATORY_CARE_PROVIDER_SITE_OTHER)
Admission: RE | Admit: 2017-12-07 | Discharge: 2017-12-07 | Disposition: A | Discharge status: Home / Self Care | Payer: Medicare Other | Source: Ambulatory Visit | Attending: Internal Medicine | Admitting: Internal Medicine

## 2017-12-07 VITALS — BP 120/80 | HR 69 | Ht 60.0 in | Wt 148.0 lb

## 2017-12-07 DIAGNOSIS — Z0000 Encounter for general adult medical examination without abnormal findings: Secondary | ICD-10-CM | POA: Diagnosis not present

## 2017-12-07 DIAGNOSIS — R918 Other nonspecific abnormal finding of lung field: Secondary | ICD-10-CM | POA: Diagnosis not present

## 2017-12-07 DIAGNOSIS — R05 Cough: Secondary | ICD-10-CM

## 2017-12-07 DIAGNOSIS — R058 Other specified cough: Secondary | ICD-10-CM

## 2017-12-07 NOTE — Progress Notes (Signed)
Subjective:     Patient ID: Terri Mccall, female   DOB: 14-Nov-1933    MRN: [id number]    Brief patient profile:  83yowf never regular smoker quit completely early 20s with  Cough and abn CT chest >  referred to pulmonary clinic 06/02/2017 by Dr   Willey Blade with MP[facility]   History of Present Illness  06/02/2017 [facility] office visit/ Terri Mccall   Chief Complaint  Patient presents with  . Pulmonary Consult    Referred by Dr. Asencion Noble for eval of pulmonary nodules. Pt c/o cough off and on for the past year. She states that she gets "tickle" in her throat.   dry cough indolent onset  is variably severe  x one year but more present than not x one month like a tickle in her throat  Not noct/ does not wake her or flare in early am hours   Better p clears throat / not really responsive to otc's including zyrtec  Not limited by breathing from desired activities   rec Pantoprazole (protonix) 40 mg   Take  30-60 min before first meal of the day and Pepcid (famotidine)  20 mg one @  bedtime until return to office -   GERD diet         12/07/2017  f/u ov/Terri Mccall re:  Mpns/ cough resolved  Chief Complaint  Patient presents with  . Follow-up    CXR was repeated. She is doing well and no new co's.    Dyspnea:  Not limited by breathing from desired activities  / does 15 min on gxt / flat  Cough: not now Sleep: ok     No obvious day to day or daytime variability or assoc excess/ purulent sputum or mucus plugs or hemoptysis or cp or chest tightness, subjective wheeze or overt sinus or hb symptoms. No unusual exposure hx or h/o childhood pna/ asthma or knowledge of premature birth.  Sleeping ok flat without nocturnal  or early am exacerbation  of respiratory  c/o's or need for noct saba. Also denies any obvious fluctuation of symptoms with weather or environmental changes or other aggravating or alleviating factors except as outlined above   Current Allergies, Complete Past Medical History, Past  Surgical History, Family History, and Social History were reviewed in [employer] record.  ROS  The following are not active complaints unless bolded Hoarseness, sore throat, dysphagia, dental problems, itching, sneezing,  nasal congestion or discharge of excess mucus or purulent secretions, ear ache,   fever, chills, sweats, unintended wt loss or wt gain, classically pleuritic or exertional cp,  orthopnea pnd or leg swelling, presyncope, palpitations, abdominal pain, anorexia, nausea, vomiting, diarrhea  or change in bowel habits or change in bladder habits, change in stools or change in urine, dysuria, hematuria,  rash, arthralgias, visual complaints, headache, numbness, weakness or ataxia or problems with walking or coordination,  change in mood/affect or memory.        Current Meds  Medication Sig  . aspirin EC 81 MG tablet Take 81 mg by mouth every evening.  Marland Kitchen atenolol (TENORMIN) 50 MG tablet Take 50 mg by mouth 2 (two) times daily.  . beta carotene w/minerals (OCUVITE) tablet Take 1 tablet by mouth every morning.  . Calcium Carbonate-Vit D-Min (CALCIUM 600+D PLUS MINERALS PO) Take 1 tablet by mouth daily.  . cetirizine (ZYRTEC) 10 MG tablet Take 10 mg by mouth daily.  . chlorthalidone (HYGROTON) 25 MG tablet Take 25 mg by  mouth daily.  . Chromium-Cinnamon (CINNAMON PLUS CHROMIUM) [phone] MCG-MG CAPS Take 1 capsule by mouth daily.  Marland Kitchen CRANBERRY PO Take 300 mg by mouth daily.  . famotidine (PEPCID) 20 MG tablet Take 20 mg by mouth at bedtime.  . fluticasone (FLONASE) 50 MCG/ACT nasal spray Place 2 sprays into both nostrils daily.  Marland Kitchen glucosamine-chondroitin 500-400 MG tablet Take 2 tablets by mouth daily.   Marland Kitchen latanoprost (XALATAN) 0.005 % ophthalmic solution Place 1 drop into both eyes at bedtime.  Marland Kitchen olmesartan (BENICAR) 40 MG tablet Take 40 mg by mouth every morning.  . pantoprazole (PROTONIX) 40 MG tablet TAKE 1 TABLET BY MOUTH ONCE DAILY 30 TO 60 MINUTES BEFORE FIRST  MEAL OF THE DAY.  Marland Kitchen potassium chloride SA (K-DUR,KLOR-CON) 20 MEQ tablet Take 20 mEq by mouth every morning.  Marland Kitchen PREMARIN vaginal cream Place 1 Applicatorful vaginally once a week.  . simvastatin (ZOCOR) 20 MG tablet Take 20 mg by mouth every evening.  . timolol (BETIMOL) 0.25 % ophthalmic solution 1-2 drops daily.               Objective:   Physical Exam  Very pleasant amb wf nad   12/07/2017       148  09/05/2017     149   07/18/2017       145   06/02/17 152 lb (68.9 kg)  09/29/16 156 lb 6.4 oz (70.9 kg)  09/09/14 150 lb (68 kg)     Vital signs reviewed - Note on arrival 02 sats  100% on RA        HEENT: nl dentition, turbinates bilaterally, and oropharynx. Nl external ear canals without cough reflex   NECK :  without JVD/Nodes/TM/ nl carotid upstrokes bilaterally   LUNGS: no acc muscle use,  Nl contour chest which is clear to A and P bilaterally without cough on insp or exp maneuvers   CV:  RRR  no s3 or murmur or increase in P2, and  trace R > L pedal edema   ABD:  soft and nontender with nl inspiratory excursion in the supine position. No bruits or organomegaly appreciated, bowel sounds nl  MS:  Nl gait/ ext warm without deformities, calf tenderness, cyanosis or clubbing No obvious joint restrictions  X L wrist in splint for CTS  SKIN: warm and dry without lesions    NEURO:  alert, approp, nl sensorium with  no motor or cerebellar deficits apparent.          CXR PA and Lateral:   12/07/2017 :    I personally reviewed images and agree with radiology impression as follows:     Opacity in the lateral right upper lobe, noted to represent nodularity on a previous CT scan, is stable based on chest x-ray                Assessment:

## 2017-12-07 NOTE — Patient Instructions (Addendum)
No change in medications for now  Please schedule a follow up visit in 6  months but call sooner if needed with cxr return

## 2017-12-08 ENCOUNTER — Encounter: Payer: Self-pay | Admitting: Internal Medicine

## 2017-12-08 NOTE — Progress Notes (Signed)
Spoke with pt and notified of results per Dr. Wert. Pt verbalized understanding and denied any questions. 

## 2017-12-08 NOTE — Assessment & Plan Note (Signed)
CT 04/26/17 -1. Numerous bilateral pulmonary nodules ranging in size from 2 mm up to 1.9 cm. Several of the nodules are associated with tubular opacities likely representing impacted bronchials, as well as regions of tree-in-bud micro nodularity - note most accessible for bx = RUL apical segment  - CXR 06/02/2017 wnl/ esr only 29  But  43 07/18/2017  - Quantiferon GOLD TB 07/18/2017 :  Neg - ESR 09/05/2017  =  45    No change on plain cxr and she is asymptomatic c/w low grade MAI as per prev notes  Discussed in detail all the  indications, usual  risks and alternatives  relative to the benefits with patient and husban who both strongly prefer  to proceed with conservative f/u as outlined with cxr in 6 m then perhaps yearly if radiographically stable   I had an extended discussion with the patient reviewing all relevant studies completed to date and  lasting 15 to 20 minutes of a 25 minute visit    Each maintenance medication was reviewed in detail including most importantly the difference between maintenance and prns and under what circumstances the prns are to be triggered using an action plan format that is not reflected in the computer generated alphabetically organized AVS.    Please see AVS for specific instructions unique to this visit that I personally wrote and verbalized to the the pt in detail and then reviewed with pt  by my nurse highlighting any  changes in therapy recommended at today's visit to their plan of care.

## 2017-12-08 NOTE — Assessment & Plan Note (Signed)
Allergy profile 06/02/2017 >  Eos 0.2 /  IgE 12 neg RAST  rx for gerd 06/02/2017 >>> 75% improved 07/18/2017 > nearly 100% by 09/05/2017 > resolved 12/07/2017   Discussed the recent press about ppi's in the context of a statistically significant (but questionably clinically relevant) increase in CRI in pts on ppi vs h2's > bottom line is the lowest dose of ppi that controls   gerd is the right dose and if that dose is zero that's fine esp since h2's are cheaper.   Since chronic cough so much better I would not consider changing rx at present but would defer rx to Dr Willey Blade

## 2017-12-28 DIAGNOSIS — H43813 Vitreous degeneration, bilateral: Secondary | ICD-10-CM | POA: Diagnosis not present

## 2017-12-28 DIAGNOSIS — H348312 Tributary (branch) retinal vein occlusion, right eye, stable: Secondary | ICD-10-CM | POA: Diagnosis not present

## 2017-12-28 DIAGNOSIS — E113293 Type 2 diabetes mellitus with mild nonproliferative diabetic retinopathy without macular edema, bilateral: Secondary | ICD-10-CM | POA: Diagnosis not present

## 2018-01-01 ENCOUNTER — Other Ambulatory Visit: Payer: Self-pay | Admitting: Internal Medicine

## 2018-01-01 DIAGNOSIS — R918 Other nonspecific abnormal finding of lung field: Secondary | ICD-10-CM

## 2018-01-16 DIAGNOSIS — E1129 Type 2 diabetes mellitus with other diabetic kidney complication: Secondary | ICD-10-CM | POA: Diagnosis not present

## 2018-01-23 DIAGNOSIS — Z23 Encounter for immunization: Secondary | ICD-10-CM | POA: Diagnosis not present

## 2018-01-23 DIAGNOSIS — E1129 Type 2 diabetes mellitus with other diabetic kidney complication: Secondary | ICD-10-CM | POA: Diagnosis not present

## 2018-01-23 DIAGNOSIS — I10 Essential (primary) hypertension: Secondary | ICD-10-CM | POA: Diagnosis not present

## 2018-02-07 DIAGNOSIS — M1812 Unilateral primary osteoarthritis of first carpometacarpal joint, left hand: Secondary | ICD-10-CM | POA: Diagnosis not present

## 2018-02-07 DIAGNOSIS — G5602 Carpal tunnel syndrome, left upper limb: Secondary | ICD-10-CM | POA: Diagnosis not present

## 2018-02-08 ENCOUNTER — Other Ambulatory Visit: Payer: Self-pay | Admitting: Orthopedic Surgery

## 2018-02-19 ENCOUNTER — Encounter (HOSPITAL_BASED_OUTPATIENT_CLINIC_OR_DEPARTMENT_OTHER): Payer: Self-pay | Admitting: *Deleted

## 2018-02-19 ENCOUNTER — Other Ambulatory Visit: Payer: Self-pay

## 2018-02-22 ENCOUNTER — Encounter (HOSPITAL_BASED_OUTPATIENT_CLINIC_OR_DEPARTMENT_OTHER)
Admission: RE | Admit: 2018-02-22 | Discharge: 2018-02-22 | Disposition: A | Discharge status: Home / Self Care | Payer: Medicare Other | Source: Ambulatory Visit | Attending: Orthopedic Surgery | Admitting: Orthopedic Surgery

## 2018-02-22 DIAGNOSIS — Z01810 Encounter for preprocedural cardiovascular examination: Secondary | ICD-10-CM | POA: Insufficient documentation

## 2018-02-22 DIAGNOSIS — Z01812 Encounter for preprocedural laboratory examination: Secondary | ICD-10-CM | POA: Insufficient documentation

## 2018-02-22 DIAGNOSIS — R9431 Abnormal electrocardiogram [ECG] [EKG]: Secondary | ICD-10-CM | POA: Diagnosis not present

## 2018-02-22 LAB — BASIC METABOLIC PANEL
Anion gap: 9 (ref 5–15)
BUN: 9 mg/dL (ref 6–20)
CALCIUM: 9.6 mg/dL (ref 8.9–10.3)
CO2: 28 mmol/L (ref 22–32)
Chloride: 94 mmol/L — ABNORMAL LOW (ref 101–111)
Creatinine, Ser: 0.71 mg/dL (ref 0.44–1.00)
GFR calc Af Amer: >60 mL/min (ref >60)
GLUCOSE: 106 mg/dL — AB (ref 65–99)
Potassium: 3.8 mmol/L (ref 3.5–5.1)
Sodium: 131 mmol/L — ABNORMAL LOW (ref 135–145)

## 2018-02-22 NOTE — Progress Notes (Signed)
EKG reviewed by Dr. Linna Caprice, will proceed with surgery as scheduled.

## 2018-03-05 NOTE — Anesthesia Preprocedure Evaluation (Signed)
Anesthesia Evaluation  Patient identified by MRN, date of birth, ID band Patient awake    Reviewed: Allergy & Precautions, H&P , NPO status , Patient's Chart, lab work & pertinent test results  History of Anesthesia Complications Negative for: history of anesthetic complications  Airway Mallampati: II  TM Distance: >3 FB Neck ROM: Full    Dental  (+) Teeth Intact   Pulmonary neg pulmonary ROS,    breath sounds clear to auscultation       Cardiovascular hypertension, Pt. on medications and Pt. on home beta blockers  Rhythm:Regular Rate:Normal     Neuro/Psych negative psych ROS   GI/Hepatic Neg liver ROS, GERD  Medicated,  Endo/Other  diabetes, Type 2  Renal/GU negative Renal ROS  negative genitourinary   Musculoskeletal  (+) Arthritis , Osteoarthritis,    Abdominal   Peds negative pediatric ROS (+)  Hematology negative hematology ROS (+)   Anesthesia Other Findings   Reproductive/Obstetrics negative OB ROS                             Anesthesia Physical  Anesthesia Plan  ASA: III  Anesthesia Plan: MAC and Bier Block and Bier Block-LIDOCAINE ONLY   Post-op Pain Management:    Induction:   PONV Risk Score and Plan: 2 and Treatment may vary due to age or medical condition  Airway Management Planned: Nasal Cannula and Natural Airway  Additional Equipment:   Intra-op Plan:   Post-operative Plan: Extubation in OR  Informed Consent: I have reviewed the patients History and Physical, chart, labs and discussed the procedure including the risks, benefits and alternatives for the proposed anesthesia with the patient or authorized representative who has indicated his/her understanding and acceptance.     Plan Discussed with: CRNA, Anesthesiologist and Surgeon  Anesthesia Plan Comments:         Anesthesia Quick Evaluation

## 2018-03-06 ENCOUNTER — Ambulatory Visit (HOSPITAL_BASED_OUTPATIENT_CLINIC_OR_DEPARTMENT_OTHER): Payer: Medicare Other | Admitting: Anesthesiology

## 2018-03-06 ENCOUNTER — Ambulatory Visit (HOSPITAL_BASED_OUTPATIENT_CLINIC_OR_DEPARTMENT_OTHER)
Admission: RE | Admit: 2018-03-06 | Discharge: 2018-03-06 | Disposition: A | Discharge status: Home / Self Care | Payer: Medicare Other | Source: Ambulatory Visit | Attending: Orthopedic Surgery | Admitting: Orthopedic Surgery

## 2018-03-06 ENCOUNTER — Encounter (HOSPITAL_BASED_OUTPATIENT_CLINIC_OR_DEPARTMENT_OTHER): Payer: Self-pay | Admitting: *Deleted

## 2018-03-06 ENCOUNTER — Encounter (HOSPITAL_BASED_OUTPATIENT_CLINIC_OR_DEPARTMENT_OTHER)
Admission: RE | Disposition: A | Discharge status: Home / Self Care | Payer: Self-pay | Source: Ambulatory Visit | Attending: Orthopedic Surgery

## 2018-03-06 DIAGNOSIS — M1990 Unspecified osteoarthritis, unspecified site: Secondary | ICD-10-CM | POA: Insufficient documentation

## 2018-03-06 DIAGNOSIS — G5602 Carpal tunnel syndrome, left upper limb: Secondary | ICD-10-CM | POA: Diagnosis not present

## 2018-03-06 DIAGNOSIS — K219 Gastro-esophageal reflux disease without esophagitis: Secondary | ICD-10-CM | POA: Diagnosis not present

## 2018-03-06 DIAGNOSIS — E119 Type 2 diabetes mellitus without complications: Secondary | ICD-10-CM | POA: Diagnosis not present

## 2018-03-06 DIAGNOSIS — I10 Essential (primary) hypertension: Secondary | ICD-10-CM | POA: Diagnosis not present

## 2018-03-06 DIAGNOSIS — E785 Hyperlipidemia, unspecified: Secondary | ICD-10-CM | POA: Diagnosis not present

## 2018-03-06 DIAGNOSIS — Z888 Allergy status to other drugs, medicaments and biological substances status: Secondary | ICD-10-CM | POA: Diagnosis not present

## 2018-03-06 DIAGNOSIS — Z7951 Long term (current) use of inhaled steroids: Secondary | ICD-10-CM | POA: Insufficient documentation

## 2018-03-06 DIAGNOSIS — Z885 Allergy status to narcotic agent status: Secondary | ICD-10-CM | POA: Diagnosis not present

## 2018-03-06 DIAGNOSIS — Z79899 Other long term (current) drug therapy: Secondary | ICD-10-CM | POA: Diagnosis not present

## 2018-03-06 HISTORY — PX: CARPAL TUNNEL RELEASE: SHX101

## 2018-03-06 HISTORY — DX: Gastro-esophageal reflux disease without esophagitis: K21.9

## 2018-03-06 HISTORY — DX: Carpal tunnel syndrome, left upper limb: G56.02

## 2018-03-06 HISTORY — DX: Prediabetes: R73.03

## 2018-03-06 SURGERY — CARPAL TUNNEL RELEASE
Anesthesia: Monitor Anesthesia Care | Site: Wrist | Laterality: Left

## 2018-03-06 MED ORDER — CHLORHEXIDINE GLUCONATE 4 % EX LIQD: 60.0000 mL | Freq: Once | CUTANEOUS | Status: DC

## 2018-03-06 MED ORDER — FENTANYL CITRATE (PF) 100 MCG/2ML IJ SOLN: 25.0000 ug | INTRAMUSCULAR | Status: DC | PRN

## 2018-03-06 MED ORDER — PROPOFOL 500 MG/50ML IV EMUL
INTRAVENOUS | Status: DC | PRN
  Administered 2018-03-06: 50 ug/kg/min via INTRAVENOUS

## 2018-03-06 MED ORDER — SCOPOLAMINE 1 MG/3DAYS TD PT72: 1.0000 | MEDICATED_PATCH | Freq: Once | TRANSDERMAL | Status: DC | PRN

## 2018-03-06 MED ORDER — MEPERIDINE HCL 25 MG/ML IJ SOLN: 6.2500 mg | INTRAMUSCULAR | Status: DC | PRN

## 2018-03-06 MED ORDER — IBUPROFEN 200 MG PO TABS: 200.0000 mg | ORAL_TABLET | ORAL | 2 refills | Status: AC | PRN

## 2018-03-06 MED ORDER — LIDOCAINE HCL (CARDIAC) PF 100 MG/5ML IV SOSY
PREFILLED_SYRINGE | INTRAVENOUS | Status: DC | PRN
  Administered 2018-03-06: 30 mg via INTRAVENOUS

## 2018-03-06 MED ORDER — CEFAZOLIN SODIUM-DEXTROSE 2-4 GM/100ML-% IV SOLN
INTRAVENOUS | Status: AC
  Filled 2018-03-06: qty 100

## 2018-03-06 MED ORDER — LACTATED RINGERS IV SOLN
INTRAVENOUS | Status: DC
  Administered 2018-03-06: 09:00:00 via INTRAVENOUS

## 2018-03-06 MED ORDER — FENTANYL CITRATE (PF) 100 MCG/2ML IJ SOLN
INTRAMUSCULAR | Status: AC
  Filled 2018-03-06: qty 2

## 2018-03-06 MED ORDER — HYDROCODONE-ACETAMINOPHEN 5-325 MG PO TABS: 1.0000 | ORAL_TABLET | Freq: Four times a day (QID) | ORAL | 0 refills | Status: DC | PRN

## 2018-03-06 MED ORDER — BUPIVACAINE HCL (PF) 0.25 % IJ SOLN
INTRAMUSCULAR | Status: DC | PRN
  Administered 2018-03-06: 10 mL

## 2018-03-06 MED ORDER — ONDANSETRON HCL 4 MG/2ML IJ SOLN
INTRAMUSCULAR | Status: DC | PRN
Start: 2018-03-06 — End: 2018-03-06
  Administered 2018-03-06: 4 mg via INTRAVENOUS

## 2018-03-06 MED ORDER — MIDAZOLAM HCL 2 MG/2ML IJ SOLN: 1.0000 mg | INTRAMUSCULAR | Status: DC | PRN

## 2018-03-06 MED ORDER — CEFAZOLIN SODIUM-DEXTROSE 2-4 GM/100ML-% IV SOLN
2.0000 g | INTRAVENOUS | Status: AC
  Administered 2018-03-06: 2 g via INTRAVENOUS

## 2018-03-06 MED ORDER — FENTANYL CITRATE (PF) 100 MCG/2ML IJ SOLN
50.0000 ug | INTRAMUSCULAR | Status: DC | PRN
  Administered 2018-03-06: 25 ug via INTRAVENOUS

## 2018-03-06 SURGICAL SUPPLY — 39 items
BLADE SURG 15 STRL LF DISP TIS (BLADE) ×1 IMPLANT
BLADE SURG 15 STRL SS (BLADE) ×2
BNDG CMPR 9X4 STRL LF SNTH (GAUZE/BANDAGES/DRESSINGS)
BNDG COHESIVE 3X5 TAN STRL LF (GAUZE/BANDAGES/DRESSINGS) ×2 IMPLANT
BNDG ESMARK 4X9 LF (GAUZE/BANDAGES/DRESSINGS) IMPLANT
BNDG GAUZE ELAST 4 BULKY (GAUZE/BANDAGES/DRESSINGS) ×2 IMPLANT
CHLORAPREP W/TINT 26ML (MISCELLANEOUS) ×2 IMPLANT
CORD BIPOLAR FORCEPS 12FT (ELECTRODE) ×2 IMPLANT
COVER BACK TABLE 60X90IN (DRAPES) ×2 IMPLANT
COVER MAYO STAND STRL (DRAPES) ×2 IMPLANT
CUFF TOURNIQUET SINGLE 18IN (TOURNIQUET CUFF) ×2 IMPLANT
DRAPE EXTREMITY T 121X128X90 (DRAPE) ×2 IMPLANT
DRAPE SURG 17X23 STRL (DRAPES) ×2 IMPLANT
DRSG PAD ABDOMINAL 8X10 ST (GAUZE/BANDAGES/DRESSINGS) ×2 IMPLANT
GAUZE SPONGE 4X4 12PLY STRL (GAUZE/BANDAGES/DRESSINGS) ×2 IMPLANT
GAUZE XEROFORM 1X8 LF (GAUZE/BANDAGES/DRESSINGS) ×2 IMPLANT
GLOVE BIO SURGEON STRL SZ 6.5 (GLOVE) ×1 IMPLANT
GLOVE BIOGEL PI IND STRL 7.0 (GLOVE) IMPLANT
GLOVE BIOGEL PI IND STRL 7.5 (GLOVE) IMPLANT
GLOVE BIOGEL PI IND STRL 8.5 (GLOVE) ×1 IMPLANT
GLOVE BIOGEL PI INDICATOR 7.0 (GLOVE) ×1
GLOVE BIOGEL PI INDICATOR 7.5 (GLOVE) ×1
GLOVE BIOGEL PI INDICATOR 8.5 (GLOVE) ×1
GLOVE SURG ORTHO 8.0 STRL STRW (GLOVE) ×2 IMPLANT
GLOVE SURG SS PI 7.5 STRL IVOR (GLOVE) ×1 IMPLANT
GOWN STRL REUS W/ TWL LRG LVL3 (GOWN DISPOSABLE) ×1 IMPLANT
GOWN STRL REUS W/TWL LRG LVL3 (GOWN DISPOSABLE) ×2
GOWN STRL REUS W/TWL XL LVL3 (GOWN DISPOSABLE) ×2 IMPLANT
NDL PRECISIONGLIDE 27X1.5 (NEEDLE) IMPLANT
NEEDLE PRECISIONGLIDE 27X1.5 (NEEDLE) ×2 IMPLANT
NS IRRIG 1000ML POUR BTL (IV SOLUTION) ×2 IMPLANT
PACK BASIN DAY SURGERY FS (CUSTOM PROCEDURE TRAY) ×2 IMPLANT
STOCKINETTE 4X48 STRL (DRAPES) ×2 IMPLANT
SUT ETHILON 4 0 PS 2 18 (SUTURE) ×2 IMPLANT
SUT VICRYL 4-0 PS2 18IN ABS (SUTURE) IMPLANT
SYR BULB 3OZ (MISCELLANEOUS) ×2 IMPLANT
SYR CONTROL 10ML LL (SYRINGE) IMPLANT
TOWEL OR 17X24 6PK STRL BLUE (TOWEL DISPOSABLE) ×2 IMPLANT
UNDERPAD 30X30 (UNDERPADS AND DIAPERS) ×2 IMPLANT

## 2018-03-06 NOTE — Op Note (Signed)
NAMELAYNIE, ESPY NO.:  [id number]  MEDICAL RECORD NO.:  [id number]  LOCATION:                                 FACILITY:  PHYSICIAN:  Daryll Brod, M.D.            DATE OF BIRTH:  DATE OF PROCEDURE:  03/06/2018 DATE OF DISCHARGE:                              OPERATIVE REPORT   PREOPERATIVE DIAGNOSIS:  Carpal tunnel syndrome, left hand.  POSTOPERATIVE DIAGNOSIS:  Carpal tunnel syndrome, left hand.  OPERATION:  Decompression of left median nerve.  SURGEON:  Daryll Brod, MD.  ASSISTANT:  None.  ANESTHESIA:  Forearm IV regional with IV sedation and local infiltration.  PLACE OF SURGERY:  [facility].  HISTORY:  The patient is an 84 year old female with a history of carpal tunnel syndrome, nerve conductions positive, which has not responded to conservative treatment.  She has elected to undergo surgical decompression to the median nerve of left hand.  Pre, peri, and postoperative courses have been discussed along with risks and complications.  She is aware that there is no guarantee to the surgery, the possibility of infection, recurrence of injury to arteries, nerves, and tendons, incomplete relief of symptoms, and dystrophy.  In the preoperative area, the patient is seen, the extremity marked by both patient and surgeon, antibiotic given.  DESCRIPTION OF PROCEDURE:  The patient was brought to the operating room where a forearm-based IV regional anesthetic was carried out without difficulty under the direction of the Anesthesia Department.  She was prepped using ChloraPrep in supine position with left arm free.  A 3- minute dry time was allowed and time-out was taken confirming the patient and procedure.  A longitudinal incision was made in the left palm and carried down through subcutaneous tissue.  Bleeders were electrocauterized with bipolar.  The palmar fascia was split.  The superficial palmar arch was identified.  The flexor tendon of  the ring and little finger was identified.  To the ulnar side of the median nerve, the carpal retinaculum was incised with sharp dissection.  A right angle and Sewell retractor were placed between skin and forearm fascia.  Deep structures were dissected free with blunt dissection. Blunt scissors were then used to release the proximal aspect of the flexor retinaculum and distal forearm fascia for approximately 2-3 cm proximal to the wrist crease under direct vision.  The canal was explored.  An area of compression to the nerve was apparent.  Motor branch entered into muscle distally.  The wound was copiously irrigated with saline.  The skin was closed with interrupted 4-0 nylon sutures.  A local infiltration with 0.25% bupivacaine without epinephrine was given; approximately 9 mL was used.  A sterile compressive dressing with the fingers free was applied.  On deflation of the tourniquet, all fingers immediately pinked.  She was taken to the recovery room for observation in satisfactory condition.  She will be discharged to home to return to [place], on ibuprofen and Norco.  She will try Tylenol and ibuprofen first.  If this dos not work, she will use the Norco.  I will see her back again  in 1 week.          ______________________________ Daryll Brod, M.D.     GK/MEDQ  D:  03/06/2018  T:  03/06/2018  Job:  [id number]

## 2018-03-06 NOTE — Anesthesia Procedure Notes (Signed)
Anesthesia Regional Block: Bier block (IV Regional)   Pre-Anesthetic Checklist: ,, timeout performed, Correct Patient, Correct Site, Correct Laterality, Correct Procedure,, site marked, surgical consent,, at surgeon's request  Laterality: Left     Needles:  Injection technique: Single-shot  Needle Type: Other      Needle Gauge: 20     Additional Needles:   Procedures:,,,,, intact distal pulses, Esmarch exsanguination, single tourniquet utilized,  Narrative:   Performed by: Personally       

## 2018-03-06 NOTE — Brief Op Note (Cosign Needed)
03/06/2018  10:21 AM  PATIENT:  Terri Mccall  84 y.o. female  PRE-OPERATIVE DIAGNOSIS:  CARPAL TUNNEL SYNDROME LEFT  POST-OPERATIVE DIAGNOSIS:  CARPAL TUNNEL SYNDROME LEFT  PROCEDURE:  Procedure(s): LEFT CARPAL TUNNEL RELEASE (Left)  SURGEON:  Surgeon(s) and Role:    * Daryll Brod, MD - Primary  PHYSICIAN ASSISTANT:   ASSISTANTS: none   ANESTHESIA:   local, regional and IV sedation  EBL: 1ml BLOOD ADMINISTERED:none  DRAINS: none   LOCAL MEDICATIONS USED:  BUPIVICAINE   SPECIMEN:  No Specimen  DISPOSITION OF SPECIMEN:  N/A  COUNTS:  YES  TOURNIQUET:   Total Tourniquet Time Documented: Forearm (Left) - 22 minutes Total: Forearm (Left) - 22 minutes   DICTATION: .Other Dictation: Dictation Number [id number]  PLAN OF CARE: Discharge to home after PACU  PATIENT DISPOSITION:  PACU - hemodynamically stable.

## 2018-03-06 NOTE — Anesthesia Procedure Notes (Signed)
Procedure Name: MAC Date/Time: 03/06/2018 9:56 AM Performed by: Signe Colt, CRNA Pre-anesthesia Checklist: Patient identified, Emergency Drugs available, Suction available, Patient being monitored and Timeout performed Patient Re-evaluated:Patient Re-evaluated prior to induction Oxygen Delivery Method: Simple face mask

## 2018-03-06 NOTE — H&P (Signed)
Terri Mccall is an 84 y.o. female.   Chief Complaint: numbness left hand HPI: Terri Mccall is an 84 year old right-hand-dominant former patient has not been seen in over 4 years. She comes in with a complaint of numbness in her left hand index and thumb. This been going on for 10-14 days. She has no history of injury. She is also complaining of pain at the basilar area of her thumb. This awakens her occasionally at night. She has a dull aching pain with a VAS score of 8-9/10. She been taking Tylenol without significant relief. He has no history of injury. She has no history of injury to her hand or to her neck. She was referred to Dr. Thereasa Parkin for nerve conductions to rule out the possibility of carpal tunnel syndrome being present. She states that the numbness and tingling is constant especially on her thumb. But is not better with the splinting. She states that [facility] arthritis is much better with the splints both hard and soft which she has been wearing. Her nerve conductions performed by Dr. Thereasa Parkin reveal the median nerve enlarged to 15 mm. Is at the wrist. Her motor delay is 5.7 ms. Her sensory delay is 2.8. She does have a history of diabetes and arthritis no history of thyroid problems and gout. Family history is negative for each of these also.      Past Medical History:  Diagnosis Date  . Arthritis   . Carpal tunnel syndrome of left wrist   . Constipation   . GERD (gastroesophageal reflux disease)   . Hyperlipidemia   . Hypertension   . Pre-diabetes     Past Surgical History:  Procedure Laterality Date  . ABDOMINAL HYSTERECTOMY    . APPENDECTOMY    . BACK SURGERY  1998  . CHOLECYSTECTOMY N/A 09/27/2016   Procedure: LAPAROSCOPIC CHOLECYSTECTOMY;  Surgeon: Vickie Epley, MD;  Location: [facility];  Service: General;  Laterality: N/A;  . COLONOSCOPY  04/26/2012   Procedure: COLONOSCOPY;  Surgeon: Rogene Houston, MD;  Location: [facility];  Service: Endoscopy;  Laterality: N/A;  830  .  TONSILLECTOMY    . TRIGGER FINGER RELEASE  11/20/2012   Procedure: RELEASE TRIGGER FINGER/A-1 PULLEY;  Surgeon: Wynonia Sours, MD;  Location: [facility];  Service: Orthopedics;  Laterality: Right;  RELEASE A-1 PULLEY RIGHT INDEX FINGER    History reviewed. No pertinent family history. Social History:  reports that she has never smoked. She has never used smokeless tobacco. She reports that she drinks alcohol. She reports that she does not use drugs.  Allergies:  Allergies  Allergen Reactions  . Bextra [Valdecoxib] Hives  . Morphine And Related     Changed personality    No medications prior to admission.    No results found for this or any previous visit (from the past 48 hour(s)).  No results found.   Pertinent items are noted in HPI.  Height 5' (1.524 m), weight 68 kg (150 lb).  General appearance: alert, cooperative and appears stated age Head: Normocephalic, without obvious abnormality Neck: no JVD Resp: clear to auscultation bilaterally Cardio: regular rate and rhythm, S1, S2 normal, no murmur, click, rub or gallop GI: soft, non-tender; bowel sounds normal; no masses,  no organomegaly Extremities:  numbness left hand Pulses: 2+ and symmetric Skin: Skin color, texture, turgor normal. No rashes or lesions Neurologic: Grossly normal Incision/Wound: na  Assessment/Plan Assessment:  1. Carpal tunnel syndrome of left wrist  2. Primary osteoarthritis of first  carpometacarpal joint of left hand    Plan: We again have discussed possibility of surgical decompression of the nerve. Would not recommend treatment of the [facility] joint at the present time and that it seems to be relatively asymptomatic for her. Pre-peri-and postoperative course are discussed along with risks and complications. She is aware there is no guarantee to the surgery the possibility of infection recurrence injury to arteries nerves tendons incomplete release symptoms dystrophy. She would like to  proceed. She is scheduled for left carpal tunnel release in outpatient under regional anesthesia.       Oron Westrup R 03/06/2018, 5:43 AM

## 2018-03-06 NOTE — Transfer of Care (Signed)
Immediate Anesthesia Transfer of Care Note  Patient: Terri Mccall  Procedure(s) Performed: LEFT CARPAL TUNNEL RELEASE (Left Wrist)  Patient Location: PACU  Anesthesia Type:Bier block  Level of Consciousness: awake, alert , oriented and patient cooperative  Airway & Oxygen Therapy: Patient Spontanous Breathing and Patient connected to face mask oxygen  Post-op Assessment: Report given to RN and Post -op Vital signs reviewed and stable  Post vital signs: Reviewed and stable  Last Vitals:  Vitals Value Taken Time  BP    Temp    Pulse    Resp    SpO2      Last Pain:  Vitals:   03/06/18 0827  TempSrc: Oral  PainSc: 0-No pain         Complications: No apparent anesthesia complications

## 2018-03-06 NOTE — Op Note (Signed)
Other Dictation: Dictation Number [id number]

## 2018-03-06 NOTE — Discharge Instructions (Addendum)

## 2018-03-06 NOTE — Anesthesia Postprocedure Evaluation (Signed)
Anesthesia Post Note  Patient: Terri Mccall  Procedure(s) Performed: LEFT CARPAL TUNNEL RELEASE (Left Wrist)     Patient location during evaluation: PACU Anesthesia Type: MAC Level of consciousness: awake and alert Pain management: pain level controlled Vital Signs Assessment: post-procedure vital signs reviewed and stable Respiratory status: spontaneous breathing, nonlabored ventilation, respiratory function stable and patient connected to nasal cannula oxygen Cardiovascular status: stable and blood pressure returned to baseline Postop Assessment: no apparent nausea or vomiting Anesthetic complications: no    Last Vitals:  Vitals:   03/06/18 1045 03/06/18 1112  BP: (!) 121/95 (!) 160/86  Pulse: 71 79  Resp: 18 18  Temp:  36.7 C  SpO2: 96% 97%    Last Pain:  Vitals:   03/06/18 1112  TempSrc:   PainSc: 0-No pain                 Alese Furniss

## 2018-03-07 ENCOUNTER — Encounter (HOSPITAL_BASED_OUTPATIENT_CLINIC_OR_DEPARTMENT_OTHER): Payer: Self-pay | Admitting: Orthopedic Surgery

## 2018-03-19 DIAGNOSIS — I10 Essential (primary) hypertension: Secondary | ICD-10-CM | POA: Diagnosis not present

## 2018-03-23 DIAGNOSIS — N390 Urinary tract infection, site not specified: Secondary | ICD-10-CM | POA: Diagnosis not present

## 2018-04-12 DIAGNOSIS — D225 Melanocytic nevi of trunk: Secondary | ICD-10-CM | POA: Diagnosis not present

## 2018-04-12 DIAGNOSIS — L821 Other seborrheic keratosis: Secondary | ICD-10-CM | POA: Diagnosis not present

## 2018-04-12 DIAGNOSIS — X32XXXD Exposure to sunlight, subsequent encounter: Secondary | ICD-10-CM | POA: Diagnosis not present

## 2018-04-12 DIAGNOSIS — L570 Actinic keratosis: Secondary | ICD-10-CM | POA: Diagnosis not present

## 2018-05-07 ENCOUNTER — Other Ambulatory Visit: Payer: Self-pay | Admitting: Internal Medicine

## 2018-05-07 DIAGNOSIS — R918 Other nonspecific abnormal finding of lung field: Secondary | ICD-10-CM

## 2018-05-28 DIAGNOSIS — E1129 Type 2 diabetes mellitus with other diabetic kidney complication: Secondary | ICD-10-CM | POA: Diagnosis not present

## 2018-06-04 DIAGNOSIS — I10 Essential (primary) hypertension: Secondary | ICD-10-CM | POA: Diagnosis not present

## 2018-06-04 DIAGNOSIS — E1129 Type 2 diabetes mellitus with other diabetic kidney complication: Secondary | ICD-10-CM | POA: Diagnosis not present

## 2018-06-05 ENCOUNTER — Other Ambulatory Visit: Payer: Self-pay | Admitting: Internal Medicine

## 2018-06-05 DIAGNOSIS — R918 Other nonspecific abnormal finding of lung field: Secondary | ICD-10-CM

## 2018-06-06 ENCOUNTER — Other Ambulatory Visit (INDEPENDENT_AMBULATORY_CARE_PROVIDER_SITE_OTHER): Payer: Medicare Other

## 2018-06-06 ENCOUNTER — Ambulatory Visit (INDEPENDENT_AMBULATORY_CARE_PROVIDER_SITE_OTHER)
Admission: RE | Admit: 2018-06-06 | Discharge: 2018-06-06 | Disposition: A | Discharge status: Home / Self Care | Payer: Medicare Other | Source: Ambulatory Visit | Attending: Internal Medicine | Admitting: Internal Medicine

## 2018-06-06 ENCOUNTER — Encounter: Payer: Self-pay | Admitting: Internal Medicine

## 2018-06-06 ENCOUNTER — Ambulatory Visit: Payer: Medicare Other | Admitting: Internal Medicine

## 2018-06-06 VITALS — BP 140/70 | HR 69 | Ht 60.0 in | Wt 153.0 lb

## 2018-06-06 DIAGNOSIS — R918 Other nonspecific abnormal finding of lung field: Secondary | ICD-10-CM

## 2018-06-06 LAB — CBC WITH DIFFERENTIAL/PLATELET
Basophils Absolute: 0 10*3/uL (ref 0.0–0.1)
Basophils Relative: 0.5 % (ref 0.0–3.0)
EOS ABS: 0.3 10*3/uL (ref 0.0–0.7)
Eosinophils Relative: 4 % (ref 0.0–5.0)
HCT: 38.7 % (ref 36.0–46.0)
HEMOGLOBIN: 13.2 g/dL (ref 12.0–15.0)
Lymphocytes Relative: 25.7 % (ref 12.0–46.0)
Lymphs Abs: 1.8 10*3/uL (ref 0.7–4.0)
MCHC: 34 g/dL (ref 30.0–36.0)
MCV: 87.5 fl (ref 78.0–100.0)
MONO ABS: 0.9 10*3/uL (ref 0.1–1.0)
Monocytes Relative: 12.2 % — ABNORMAL HIGH (ref 3.0–12.0)
NEUTROS PCT: 57.6 % (ref 43.0–77.0)
Neutro Abs: 4 10*3/uL (ref 1.4–7.7)
Platelets: 210 10*3/uL (ref 150.0–400.0)
RBC: 4.42 Mil/uL (ref 3.87–5.11)
RDW: 14.8 % (ref 11.5–15.5)
WBC: 7 10*3/uL (ref 4.0–10.5)

## 2018-06-06 LAB — SEDIMENTATION RATE: SED RATE: 27 mm/h (ref 0–30)

## 2018-06-06 NOTE — Progress Notes (Signed)
Subjective:     Patient ID: Terri Mccall, female   DOB: [birth year]    MRN: [id number]    Brief patient profile:  83yowf never regular smoker quit completely early 20s with  Cough and abn CT chest >  referred to pulmonary clinic 06/02/2017 by Dr   Terri Mccall with [facility]   History of Present Illness  06/02/2017 1st [facility] office visit/ Terri Mccall   Chief Complaint  Patient presents with  . Pulmonary Consult    Referred by Dr. Asencion Mccall for eval of pulmonary nodules. Pt c/o cough off and on for the past year. She states that she gets "tickle" in her throat.   dry cough indolent onset  is variably severe  x one year but more present than not x one month like a tickle in her throat  Not noct/ does not wake her or flare in early am hours   Better p clears throat / not really responsive to otc's including zyrtec  Not limited by breathing from desired activities   rec Pantoprazole (protonix) 40 mg   Take  30-60 min before first meal of the day and Pepcid (famotidine)  20 mg one @  bedtime until return to office -   GERD diet         12/07/2017  f/u ov/Terri Mccall re:  Mpns/ cough resolved  Chief Complaint  Patient presents with  . Follow-up    CXR was repeated. She is doing well and no new co's.    Dyspnea:  Not limited by breathing from desired activities  / does 15 min on gxt / flat  Cough: not now Sleep: ok  REC No change rx/ cxr on return     06/06/2018  f/u ov/Terri Mccall re:  mpns / intermittent cough/throat tickle   Chief Complaint  Patient presents with  . Follow-up    Overall doing well. She had a coughing spell a few nights ago- started with tickle in her throat after taking a deep breath.    Dyspnea:  Not limited by breathing from desired activities   Cough: rare now and when does cough assoc with pnds mostly daytime  Sleeping: one pillow     No obvious day to day or daytime variability or assoc excess/ purulent sputum or mucus plugs or hemoptysis or cp or chest tightness, subjective  wheeze or overt sinus or hb symptoms.   Sleeping flat ok  without nocturnal  or early am exacerbation  of respiratory  c/o's or need for noct saba. Also denies any obvious fluctuation of symptoms with weather or environmental changes or other aggravating or alleviating factors except as outlined above   No unusual exposure hx or h/o childhood pna/ asthma or knowledge of premature birth.  Current Allergies, Complete Past Medical History, Past Surgical History, Family History, and Social History were reviewed in [employer] record.  ROS  The following are not active complaints unless bolded Hoarseness, sore throat, dysphagia, dental problems, itching, sneezing,  nasal congestion or discharge of excess mucus or purulent secretions, ear ache,   fever, chills, sweats, unintended wt loss or wt gain, classically pleuritic or exertional cp,  orthopnea pnd or arm/hand swelling  or leg swelling, presyncope, palpitations, abdominal pain, anorexia, nausea, vomiting, diarrhea  or change in bowel habits or change in bladder habits, change in stools or change in urine, dysuria, hematuria,  rash, arthralgias, visual complaints, headache, numbness, weakness or ataxia or problems with walking or coordination,  change in mood or  memory.            Current Meds  Medication Sig  . amLODipine (NORVASC) 2.5 MG tablet Take 1 tablet by mouth daily.  Marland Kitchen aspirin EC 81 MG tablet Take 81 mg by mouth every evening.  Marland Kitchen atenolol (TENORMIN) 50 MG tablet Take 50 mg by mouth 2 (two) times daily.  . beta carotene w/minerals (OCUVITE) tablet Take 1 tablet by mouth every morning.  . Calcium Carbonate-Vit D-Min (CALCIUM 600+D PLUS MINERALS PO) Take 1 tablet by mouth daily.  . cetirizine (ZYRTEC) 10 MG tablet Take 10 mg by mouth daily.  . chlorthalidone (HYGROTON) 25 MG tablet Take 25 mg by mouth daily.  . Chromium-Cinnamon (CINNAMON PLUS CHROMIUM) [phone] MCG-MG CAPS Take 1 capsule by mouth daily.  Marland Kitchen  CRANBERRY PO Take 300 mg by mouth daily.  . famotidine (PEPCID) 20 MG tablet Take 20 mg by mouth at bedtime.  . fluticasone (FLONASE) 50 MCG/ACT nasal spray Place 2 sprays into both nostrils daily.  Marland Kitchen glucosamine-chondroitin 500-400 MG tablet Take 2 tablets by mouth daily.   Marland Kitchen HYDROcodone-acetaminophen (NORCO) 5-325 MG tablet Take 1 tablet by mouth every 6 (six) hours as needed.  Marland Kitchen ibuprofen (MOTRIN IB) 200 MG tablet Take 1 tablet (200 mg total) by mouth every 4 (four) hours as needed.  . latanoprost (XALATAN) 0.005 % ophthalmic solution Place 1 drop into both eyes at bedtime.  Marland Kitchen LORazepam (ATIVAN) 0.5 MG tablet Take 1 tablet by mouth daily as needed.  Marland Kitchen olmesartan (BENICAR) 40 MG tablet Take 40 mg by mouth every morning.  . pantoprazole (PROTONIX) 40 MG tablet TAKE 1 TABLET BY MOUTH ONCE DAILY 30 TO 60 MINUTES BEFORE FIRST MEAL OF THE DAY.  Marland Kitchen potassium chloride SA (K-DUR,KLOR-CON) 20 MEQ tablet Take 20 mEq by mouth every morning.  Marland Kitchen PREMARIN vaginal cream Place 1 Applicatorful vaginally once a week.  . simvastatin (ZOCOR) 20 MG tablet Take 20 mg by mouth every evening.  . timolol (BETIMOL) 0.25 % ophthalmic solution 1-2 drops daily.              Objective:   Physical Exam  amb wf / nad all smiles     06/06/2018       153  12/07/2017       148  09/05/2017     149   07/18/2017       145   06/02/17 152 lb (68.9 kg)  09/29/16 156 lb 6.4 oz (70.9 kg)  09/09/14 150 lb (68 kg)    HEENT: nl dentition, turbinates bilaterally, and oropharynx. Nl external ear canals without cough reflex   NECK :  without JVD/Nodes/TM/ nl carotid upstrokes bilaterally   LUNGS: no acc muscle use,  Nl contour chest which is clear to A and P bilaterally without cough on insp or exp maneuvers   CV:  RRR  no s3 or murmur or increase in P2, and trace R >L pitting edemia   ABD:  soft and nontender with nl inspiratory excursion in the supine position. No bruits or organomegaly appreciated, bowel sounds  nl  MS:  Nl gait/ ext warm without deformities, calf tenderness, cyanosis or clubbing No obvious joint restrictions   SKIN: warm and dry without lesions    NEURO:  alert, approp, nl sensorium with  no motor or cerebellar deficits apparent.          CXR PA and Lateral:   06/06/2018 :    I personally reviewed images and agree with radiology impression  as follows:    Radiographically stable right apical nodularity. No new abnormality.   Lab Results  Component Value Date   WBC 7.0 06/06/2018   HGB 13.2 06/06/2018   HCT 38.7 06/06/2018   MCV 87.5 06/06/2018   PLT 210.0 06/06/2018       Lab Results  Component Value Date   ESRSEDRATE 27 06/06/2018   ESRSEDRATE 45 (H) 09/05/2017   ESRSEDRATE 43 (H) 07/18/2017                Assessment:

## 2018-06-06 NOTE — Patient Instructions (Signed)
Please remember to go to the  x-ray department downstairs in the basement  for your tests - we will call you with the results when they are available.     Please schedule a follow up visit in 12  months but call sooner if needed  

## 2018-06-07 ENCOUNTER — Encounter: Payer: Self-pay | Admitting: Internal Medicine

## 2018-06-07 NOTE — Assessment & Plan Note (Signed)
CT 04/26/17 -1. Numerous bilateral pulmonary nodules ranging in size from 2 mm up to 1.9 cm. Several of the nodules are associated with tubular opacities likely representing impacted bronchials, as well as regions of tree-in-bud micro nodularity - note most accessible for bx = RUL apical segment  - CXR 06/02/2017 wnl/ esr only 29  But  43 07/18/2017  - Quantiferon GOLD TB 07/18/2017 :  Neg   - ESR 09/05/2017  =  45  >  Down to 27 06/06/2018 with no change on cxr > rec repeat cxr 12 months unless new chest symptoms in meantime      Discussed in detail all the  indications, usual  risks and alternatives  relative to the benefits with patient who agrees to proceed with conservative f/u as outlined  = 12 months    Each maintenance medication was reviewed in detail including most importantly the difference between maintenance and as needed and under what circumstances the prns are to be used.  Please see AVS for specific  Instructions which are unique to this visit and I personally typed out  which were reviewed in detail in writing with the patient and a copy provided.

## 2018-06-27 DIAGNOSIS — Z1231 Encounter for screening mammogram for malignant neoplasm of breast: Secondary | ICD-10-CM | POA: Diagnosis not present

## 2018-07-04 ENCOUNTER — Telehealth: Payer: Self-pay | Admitting: Internal Medicine

## 2018-07-04 NOTE — Telephone Encounter (Signed)
Called and spoke with patient, advised that per MW patient is to take pantoprazole. Nothing further needed.

## 2018-09-04 DIAGNOSIS — H5203 Hypermetropia, bilateral: Secondary | ICD-10-CM | POA: Diagnosis not present

## 2018-09-04 DIAGNOSIS — Z961 Presence of intraocular lens: Secondary | ICD-10-CM | POA: Diagnosis not present

## 2018-09-04 DIAGNOSIS — E113293 Type 2 diabetes mellitus with mild nonproliferative diabetic retinopathy without macular edema, bilateral: Secondary | ICD-10-CM | POA: Diagnosis not present

## 2018-09-04 DIAGNOSIS — H401131 Primary open-angle glaucoma, bilateral, mild stage: Secondary | ICD-10-CM | POA: Diagnosis not present

## 2018-09-05 ENCOUNTER — Other Ambulatory Visit: Payer: Self-pay | Admitting: Internal Medicine

## 2018-09-05 DIAGNOSIS — R918 Other nonspecific abnormal finding of lung field: Secondary | ICD-10-CM

## 2018-10-10 DIAGNOSIS — E1129 Type 2 diabetes mellitus with other diabetic kidney complication: Secondary | ICD-10-CM | POA: Diagnosis not present

## 2018-10-10 DIAGNOSIS — I10 Essential (primary) hypertension: Secondary | ICD-10-CM | POA: Diagnosis not present

## 2018-10-10 DIAGNOSIS — Z79899 Other long term (current) drug therapy: Secondary | ICD-10-CM | POA: Diagnosis not present

## 2018-10-15 DIAGNOSIS — Z0001 Encounter for general adult medical examination with abnormal findings: Secondary | ICD-10-CM | POA: Diagnosis not present

## 2018-10-15 DIAGNOSIS — E1139 Type 2 diabetes mellitus with other diabetic ophthalmic complication: Secondary | ICD-10-CM | POA: Diagnosis not present

## 2018-10-15 DIAGNOSIS — I700 Atherosclerosis of aorta: Secondary | ICD-10-CM | POA: Diagnosis not present

## 2018-10-15 DIAGNOSIS — I10 Essential (primary) hypertension: Secondary | ICD-10-CM | POA: Diagnosis not present

## 2018-11-16 DIAGNOSIS — R609 Edema, unspecified: Secondary | ICD-10-CM | POA: Diagnosis not present

## 2018-11-16 DIAGNOSIS — I10 Essential (primary) hypertension: Secondary | ICD-10-CM | POA: Diagnosis not present

## 2018-12-03 ENCOUNTER — Other Ambulatory Visit: Payer: Self-pay | Admitting: Internal Medicine

## 2018-12-03 DIAGNOSIS — R918 Other nonspecific abnormal finding of lung field: Secondary | ICD-10-CM

## 2018-12-27 DIAGNOSIS — R001 Bradycardia, unspecified: Secondary | ICD-10-CM | POA: Diagnosis not present

## 2018-12-27 DIAGNOSIS — I10 Essential (primary) hypertension: Secondary | ICD-10-CM | POA: Diagnosis not present

## 2019-01-07 DIAGNOSIS — I10 Essential (primary) hypertension: Secondary | ICD-10-CM | POA: Diagnosis not present

## 2019-02-11 DIAGNOSIS — I10 Essential (primary) hypertension: Secondary | ICD-10-CM | POA: Diagnosis not present

## 2019-02-11 DIAGNOSIS — R001 Bradycardia, unspecified: Secondary | ICD-10-CM | POA: Diagnosis not present

## 2019-04-17 DIAGNOSIS — H53481 Generalized contraction of visual field, right eye: Secondary | ICD-10-CM | POA: Diagnosis not present

## 2019-04-17 DIAGNOSIS — H353131 Nonexudative age-related macular degeneration, bilateral, early dry stage: Secondary | ICD-10-CM | POA: Diagnosis not present

## 2019-04-17 DIAGNOSIS — H35363 Drusen (degenerative) of macula, bilateral: Secondary | ICD-10-CM | POA: Diagnosis not present

## 2019-04-17 DIAGNOSIS — H35453 Secondary pigmentary degeneration, bilateral: Secondary | ICD-10-CM | POA: Diagnosis not present

## 2019-04-17 DIAGNOSIS — H468 Other optic neuritis: Secondary | ICD-10-CM | POA: Diagnosis not present

## 2019-04-25 DIAGNOSIS — E1129 Type 2 diabetes mellitus with other diabetic kidney complication: Secondary | ICD-10-CM | POA: Diagnosis not present

## 2019-04-25 DIAGNOSIS — I10 Essential (primary) hypertension: Secondary | ICD-10-CM | POA: Diagnosis not present

## 2019-05-06 ENCOUNTER — Other Ambulatory Visit: Payer: Self-pay | Admitting: Internal Medicine

## 2019-05-06 DIAGNOSIS — R918 Other nonspecific abnormal finding of lung field: Secondary | ICD-10-CM

## 2019-06-10 ENCOUNTER — Encounter: Payer: Self-pay | Admitting: Internal Medicine

## 2019-06-10 ENCOUNTER — Ambulatory Visit (INDEPENDENT_AMBULATORY_CARE_PROVIDER_SITE_OTHER): Payer: Medicare Other

## 2019-06-10 ENCOUNTER — Ambulatory Visit (INDEPENDENT_AMBULATORY_CARE_PROVIDER_SITE_OTHER): Payer: Medicare Other | Admitting: Internal Medicine

## 2019-06-10 DIAGNOSIS — R05 Cough: Secondary | ICD-10-CM | POA: Diagnosis not present

## 2019-06-10 DIAGNOSIS — R918 Other nonspecific abnormal finding of lung field: Secondary | ICD-10-CM

## 2019-06-10 DIAGNOSIS — R058 Other specified cough: Secondary | ICD-10-CM

## 2019-06-10 NOTE — Patient Instructions (Signed)
Please remember to go to the x-ray department   for your tests - we will call you with the results when they are available.     Please schedule a follow up visit in  12 months but call sooner if needed.    

## 2019-06-10 NOTE — Assessment & Plan Note (Addendum)
Onset 2017 Allergy profile 06/02/2017 >  Eos 0.2 /  IgE 12 neg RAST  rx for gerd 06/02/2017 >>> 75% improved 07/18/2017 > nearly 100% by 09/05/2017 > resolved 12/07/2017   Rare spells assoc with voice use then voice loss transiently and resolve rapidly s rx needed other than maint on gerd rx / rec no change rx = gerd rx/ diet   I had an extended discussion with the patient reviewing all relevant studies completed to date and  lasting 15 to 20 minutes of a 25 minute visit    Each maintenance medication was reviewed in detail including most importantly the difference between maintenance and prns and under what circumstances the prns are to be triggered using an action plan format that is not reflected in the computer generated alphabetically organized AVS.     Please see AVS for specific instructions unique to this visit that I personally wrote and verbalized to the the pt in detail and then reviewed with pt  by my nurse highlighting any  changes in therapy recommended at today's visit to their plan of care.

## 2019-06-10 NOTE — Progress Notes (Signed)
Subjective:     Patient ID: Terri Mccall, female   DOB: 1934/04/15    MRN: [id number]    Brief patient profile:  83 yowf never regular smoker quit completely early 20s with  Cough and abn CT chest >  referred to pulmonary clinic 06/02/2017 by Dr   Willey Blade with MPNs   History of Present Illness  06/02/2017 1st [facility] office visit/ Terri Mccall   Chief Complaint  Patient presents with  . Pulmonary Consult    Referred by Dr. Asencion Noble for eval of pulmonary nodules. Pt c/o cough off and on for the past year. She states that she gets "tickle" in her throat.   dry cough indolent onset  is variably severe  x one year but more present than not x one month like a tickle in her throat  Not noct/ does not wake her or flare in early am hours   Better p clears throat / not really responsive to otc's including zyrtec  Not limited by breathing from desired activities   rec Pantoprazole (protonix) 40 mg   Take  30-60 min before first meal of the day and Pepcid (famotidine)  20 mg one @  bedtime until return to office -   GERD diet       06/06/2018  f/u ov/[name] re:  mpns / intermittent cough/throat tickle   Chief Complaint  Patient presents with  . Follow-up    Overall doing well. She had a coughing spell a few nights ago- started with tickle in her throat after taking a deep breath.    Dyspnea:  Not limited by breathing from desired activities   Cough: rare now and when does cough assoc with pnds mostly daytime  Sleeping: one pillow   rec No change rx   06/10/2019  f/u ov/[name] re:  Mpnds, uacs  Chief Complaint  Patient presents with  . Follow-up    Breathing is overall doing well and no new co's.   Dyspnea:  In heat uphill some doe = baseline Cough: not much at all anymore, sometime with voice use suddenly loses voice transiently then quickly recovers, but only happens a few times a year  Sleeping: fine one pillow  SABA use: none 02: none    No obvious day to day or daytime variability  or assoc excess/ purulent sputum or mucus plugs or hemoptysis or cp or chest tightness, subjective wheeze or overt sinus or hb symptoms.   Sleeping as above  without nocturnal  or early am exacerbation  of respiratory  c/o's or need for noct saba. Also denies any obvious fluctuation of symptoms with weather or environmental changes or other aggravating or alleviating factors except as outlined above   No unusual exposure hx or h/o childhood pna/ asthma or knowledge of premature birth.  Current Allergies, Complete Past Medical History, Past Surgical History, Family History, and Social History were reviewed in [employer] record.  ROS  The following are not active complaints unless bolded Hoarseness, sore throat, dysphagia, dental problems, itching, sneezing,  nasal congestion or discharge of excess mucus or purulent secretions, ear ache,   fever, chills, sweats, unintended wt loss or wt gain, classically pleuritic or exertional cp,  orthopnea pnd or arm/hand swelling  or leg swelling, presyncope, palpitations, abdominal pain, anorexia, nausea, vomiting, diarrhea  or change in bowel habits or change in bladder habits, change in stools or change in urine, dysuria, hematuria,  rash, arthralgias, visual complaints, headache, numbness, weakness or ataxia  or problems with walking or coordination,  change in mood or  memory.        Current Meds  Medication Sig  . amLODipine (NORVASC) 2.5 MG tablet Take 1 tablet by mouth daily.  Marland Kitchen aspirin EC 81 MG tablet Take 81 mg by mouth every evening.  Marland Kitchen atenolol (TENORMIN) 12.5 mg TABS tablet Take 12.5 tablets by mouth 2 (two) times daily.  . beta carotene w/minerals (OCUVITE) tablet Take 1 tablet by mouth every morning.  . Calcium Carbonate-Vit D-Min (CALCIUM 600+D PLUS MINERALS PO) Take 1 tablet by mouth daily.  . cetirizine (ZYRTEC) 10 MG tablet Take 10 mg by mouth daily.  . chlorthalidone (HYGROTON) 25 MG tablet Take 25 mg by mouth daily.   . Chromium-Cinnamon (CINNAMON PLUS CHROMIUM) [phone] MCG-MG CAPS Take 1 capsule by mouth daily.  Marland Kitchen CRANBERRY PO Take 300 mg by mouth daily.  . famotidine (PEPCID) 20 MG tablet Take 20 mg by mouth at bedtime.  . fluticasone (FLONASE) 50 MCG/ACT nasal spray Place 2 sprays into both nostrils daily.  Marland Kitchen glucosamine-chondroitin 500-400 MG tablet Take 2 tablets by mouth daily.   Marland Kitchen latanoprost (XALATAN) 0.005 % ophthalmic solution Place 1 drop into both eyes at bedtime.  Marland Kitchen LORazepam (ATIVAN) 0.5 MG tablet Take 1 tablet by mouth daily as needed.  Marland Kitchen olmesartan (BENICAR) 40 MG tablet Take 40 mg by mouth every morning.  . pantoprazole (PROTONIX) 40 MG tablet TAKE 1 TABLET BY MOUTH ONCE DAILY 30 TO 60 MINUTES BEFORE FIRST MEAL OF THE DAY.  Marland Kitchen potassium chloride SA (K-DUR,KLOR-CON) 20 MEQ tablet Take 20 mEq by mouth every morning.  Marland Kitchen PREMARIN vaginal cream Place 1 Applicatorful vaginally once a week.  . simvastatin (ZOCOR) 20 MG tablet Take 20 mg by mouth every evening.  . timolol (BETIMOL) 0.25 % ophthalmic solution 1-2 drops daily.             Objective:   Physical Exam  amb wf nad   06/10/2019         155  06/06/2018       153  12/07/2017       148  09/05/2017     149   07/18/2017       145   06/02/17 152 lb (68.9 kg)  09/29/16 156 lb 6.4 oz (70.9 kg)  09/09/14 150 lb (68 kg)   Vital signs reviewed - Note on arrival 02 sats  98% on RA     HEENT: nl dentition, turbinates bilaterally, and oropharynx. Nl external ear canals without cough reflex   NECK :  without JVD/Nodes/TM/ nl carotid upstrokes bilaterally   LUNGS: no acc muscle use,  Nl contour chest which is clear to A and P bilaterally without cough on insp or exp maneuvers   CV:  RRR  no s3 or murmur or increase in P2, and trace R > L pitting LE edema despite elastic hose  ABD:  soft and nontender with nl inspiratory excursion in the supine position. No bruits or organomegaly appreciated, bowel sounds nl  MS:  Nl gait/ ext warm  with DJD changes both hands , calf tenderness, cyanosis or clubbing No obvious joint restrictions   SKIN: warm and dry without lesions    NEURO:  alert, approp, nl sensorium with  no motor or cerebellar deficits apparent.         CXR PA and Lateral:   06/10/2019 :    I personally reviewed images and agree with radiology impression as follows:  Diffuse reticulonodular opacities throughout both lungs, not significantly changed accounting for technique. Could reflect sequela of a chronic infectious, inflammatory, or granulomatous process.               Assessment:

## 2019-06-11 ENCOUNTER — Encounter: Payer: Self-pay | Admitting: Internal Medicine

## 2019-06-11 NOTE — Assessment & Plan Note (Signed)
CT 04/26/17 -1. Numerous bilateral pulmonary nodules ranging in size from 2 mm up to 1.9 cm. Several of the nodules are associated with tubular opacities likely representing impacted bronchials, as well as regions of tree-in-bud micro nodularity - note most accessible for bx = RUL apical segment  - CXR 06/02/2017 wnl/ esr only 29  But  43 07/18/2017  - Quantiferon GOLD TB 07/18/2017 :  Neg - ESR 09/05/2017  =  45  >  Down to 27 06/06/2018 with no change on cxr > rec repeat cxr 12 months unless new chest symptoms in meantime  > no change 06/10/2019   Most likely this is low grade MAC. This is an extremely common benign condition in the elderly and does not warrant aggressive eval/ rx at this point unless there is a clinical correlation suggesting unaddressed pulmonary infection (purulent sputum, night sweats, unintended wt loss, doe) or evolution of  obvious changes on plain cxr (as opposed to serial CT, which is way over sensitive to make clinical decisions re intervention and treatment in the elderly, who tend to tolerate both dx and treatment poorly) .    Discussed in detail all the  indications, usual  risks and alternatives  relative to the benefits with patient who agrees to proceed with conservative f/u as outlined = 12 months with cxr unless new symptoms

## 2019-06-12 NOTE — Progress Notes (Signed)
Spoke with pt and notified of results per Dr. Wert. Pt verbalized understanding and denied any questions. 

## 2019-06-12 NOTE — Progress Notes (Signed)
LMTCB

## 2019-06-17 DIAGNOSIS — N390 Urinary tract infection, site not specified: Secondary | ICD-10-CM | POA: Diagnosis not present

## 2019-07-16 DIAGNOSIS — Z1231 Encounter for screening mammogram for malignant neoplasm of breast: Secondary | ICD-10-CM | POA: Diagnosis not present

## 2019-07-26 DIAGNOSIS — I10 Essential (primary) hypertension: Secondary | ICD-10-CM | POA: Diagnosis not present

## 2019-08-06 ENCOUNTER — Other Ambulatory Visit: Payer: Self-pay | Admitting: Internal Medicine

## 2019-08-06 DIAGNOSIS — R918 Other nonspecific abnormal finding of lung field: Secondary | ICD-10-CM

## 2019-08-26 DIAGNOSIS — N3000 Acute cystitis without hematuria: Secondary | ICD-10-CM | POA: Diagnosis not present

## 2019-09-11 DIAGNOSIS — Z961 Presence of intraocular lens: Secondary | ICD-10-CM | POA: Diagnosis not present

## 2019-09-11 DIAGNOSIS — E113393 Type 2 diabetes mellitus with moderate nonproliferative diabetic retinopathy without macular edema, bilateral: Secondary | ICD-10-CM | POA: Diagnosis not present

## 2019-09-11 DIAGNOSIS — H0011 Chalazion right upper eyelid: Secondary | ICD-10-CM | POA: Diagnosis not present

## 2019-09-11 DIAGNOSIS — H52203 Unspecified astigmatism, bilateral: Secondary | ICD-10-CM | POA: Diagnosis not present

## 2019-10-17 DIAGNOSIS — E785 Hyperlipidemia, unspecified: Secondary | ICD-10-CM | POA: Diagnosis not present

## 2019-10-17 DIAGNOSIS — R609 Edema, unspecified: Secondary | ICD-10-CM | POA: Diagnosis not present

## 2019-10-17 DIAGNOSIS — I10 Essential (primary) hypertension: Secondary | ICD-10-CM | POA: Diagnosis not present

## 2019-10-17 DIAGNOSIS — I700 Atherosclerosis of aorta: Secondary | ICD-10-CM | POA: Diagnosis not present

## 2019-10-17 DIAGNOSIS — E1129 Type 2 diabetes mellitus with other diabetic kidney complication: Secondary | ICD-10-CM | POA: Diagnosis not present

## 2019-10-24 DIAGNOSIS — I10 Essential (primary) hypertension: Secondary | ICD-10-CM | POA: Diagnosis not present

## 2019-10-24 DIAGNOSIS — I700 Atherosclerosis of aorta: Secondary | ICD-10-CM | POA: Diagnosis not present

## 2019-10-24 DIAGNOSIS — Z0001 Encounter for general adult medical examination with abnormal findings: Secondary | ICD-10-CM | POA: Diagnosis not present

## 2019-10-24 DIAGNOSIS — E785 Hyperlipidemia, unspecified: Secondary | ICD-10-CM | POA: Diagnosis not present

## 2019-10-24 DIAGNOSIS — E1129 Type 2 diabetes mellitus with other diabetic kidney complication: Secondary | ICD-10-CM | POA: Diagnosis not present

## 2020-03-03 ENCOUNTER — Other Ambulatory Visit: Payer: Self-pay

## 2020-03-03 ENCOUNTER — Encounter: Payer: Self-pay | Admitting: Podiatry

## 2020-03-03 ENCOUNTER — Ambulatory Visit: Payer: Medicare Other | Admitting: Podiatry

## 2020-03-03 VITALS — Temp 97.9°F

## 2020-03-03 DIAGNOSIS — L989 Disorder of the skin and subcutaneous tissue, unspecified: Secondary | ICD-10-CM

## 2020-03-05 NOTE — Progress Notes (Signed)
   Subjective: 86 y.o. female presenting to the office today with a chief complaint of burning, stinging corns to the 4th and 5th toes of the right foot. Wearing closed-toed shoes increases the pain. She has used toe pads and applied Neosporin for treatment. Patient is here for further evaluation and treatment.    Past Medical History:  Diagnosis Date  . Arthritis   . Carpal tunnel syndrome of left wrist   . Constipation   . GERD (gastroesophageal reflux disease)   . Hyperlipidemia   . Hypertension   . Pre-diabetes      Objective:  Physical Exam General: Alert and oriented x3 in no acute distress  Dermatology: Hyperkeratotic lesion(s) present on the right 4th and 5th toes. Pain on palpation with a central nucleated core noted. Skin is warm, dry and supple bilateral lower extremities. Negative for open lesions or macerations.  Vascular: Palpable pedal pulses bilaterally. No edema or erythema noted. Capillary refill within normal limits.  Neurological: Epicritic and protective threshold grossly intact bilaterally.   Musculoskeletal Exam: Pain on palpation at the keratotic lesion(s) noted. Range of motion within normal limits bilateral. Muscle strength 5/5 in all groups bilateral.  Assessment: 1. Pre-ulcerative callus lesions noted to the right 4th and 5th toes    Plan of Care:  1. Patient evaluated 2. Excisional debridement of keratoic lesion(s) using a chisel blade was performed without incident.  3. Dressed area with light dressing. 4. Silicone toe caps dispensed.  5. Patient is to return to the clinic PRN.   Edrick Kins, DPM [facility]  Dr. Edrick Kins, [address]                                        [place], [place] [address]                Office [phone]  Fax [phone]

## 2020-06-09 ENCOUNTER — Ambulatory Visit (INDEPENDENT_AMBULATORY_CARE_PROVIDER_SITE_OTHER): Payer: Medicare Other

## 2020-06-09 ENCOUNTER — Other Ambulatory Visit: Payer: Self-pay

## 2020-06-09 ENCOUNTER — Ambulatory Visit: Payer: Medicare Other | Admitting: Internal Medicine

## 2020-06-09 ENCOUNTER — Encounter: Payer: Self-pay | Admitting: Internal Medicine

## 2020-06-09 DIAGNOSIS — R05 Cough: Secondary | ICD-10-CM | POA: Diagnosis not present

## 2020-06-09 DIAGNOSIS — R918 Other nonspecific abnormal finding of lung field: Secondary | ICD-10-CM

## 2020-06-09 DIAGNOSIS — R058 Other specified cough: Secondary | ICD-10-CM

## 2020-06-09 NOTE — Progress Notes (Signed)
Subjective:     Patient ID: Terri Mccall, female   DOB: 16-Jan-1934    MRN: [id number]    Brief patient profile:  39 yowf never regular smoker quit completely early 20s with  Cough and abn CT chest >  referred to pulmonary clinic 06/02/2017 by Dr   Willey Blade with MPNs   History of Present Illness  06/02/2017 1st [facility] office visit/ Kamarah Bilotta   Chief Complaint  Patient presents with  . Pulmonary Consult    Referred by Dr. Asencion Noble for eval of pulmonary nodules. Pt c/o cough off and on for the past year. She states that she gets "tickle" in her throat.   dry cough indolent onset  is variably severe  x one year but more present than not x one month like a tickle in her throat  Not noct/ does not wake her or flare in early am hours   Better p clears throat / not really responsive to otc's including zyrtec  Not limited by breathing from desired activities   rec Pantoprazole (protonix) 40 mg   Take  30-60 min before first meal of the day and Pepcid (famotidine)  20 mg one @  bedtime until return to office -   GERD diet      06/10/2019  f/u ov/Kalissa Grays re:  Mpns, uacs  Chief Complaint  Patient presents with  . Follow-up    Breathing is overall doing well and no new co's.   Dyspnea:  In heat uphill some doe = baseline Cough: not much at all anymore, sometime with voice use suddenly loses voice transiently then quickly recovers, but only happens a few times a year  Sleeping: fine one pillow  SABA use: none 02: none  rec cxr yearly   06/09/2020  f/u ov/Gizzelle Lacomb re: mpn's  / uacs  Chief Complaint  Patient presents with  . Follow-up    Breathing is overall doing well. She states only gets winded after eating a meal and walking up stairs.   Dyspnea: Not limited by breathing from desired activities   Cough: with certain foods  eg pomento cheese / ice cream Sleeping: flat bed a few pillows  SABA use: none  02: no    No obvious day to day or daytime variability or assoc excess/ purulent sputum  or mucus plugs or hemoptysis or cp or chest tightness, subjective wheeze or overt sinus or hb symptoms.   98 without nocturnal  or early am exacerbation  of respiratory  c/o's or need for noct saba. Also denies any obvious fluctuation of symptoms with weather or environmental changes or other aggravating or alleviating factors except as outlined above   No unusual exposure hx or h/o childhood pna/ asthma or knowledge of premature birth.  Current Allergies, Complete Past Medical History, Past Surgical History, Family History, and Social History were reviewed in [employer] record.  ROS  The following are not active complaints unless bolded Hoarseness, sore throat, dysphagia, dental problems, itching, sneezing,  nasal congestion or discharge of excess mucus or purulent secretions, ear ache,   fever, chills, sweats, unintended wt loss or wt gain, classically pleuritic or exertional cp,  orthopnea pnd or arm/hand swelling  or leg swelling, presyncope, palpitations, abdominal pain, anorexia, nausea, vomiting, diarrhea  or change in bowel habits or change in bladder habits, change in stools or change in urine, dysuria, hematuria,  rash, arthralgias, visual complaints, headache, numbness, weakness or ataxia or problems with walking or coordination,  change  in mood or  memory.        Current Meds  Medication Sig  . aspirin EC 81 MG tablet Take 81 mg by mouth every evening.  Marland Kitchen atenolol (TENORMIN) 12.5 mg TABS tablet Take 12.5 tablets by mouth 2 (two) times daily.  . beta carotene w/minerals (OCUVITE) tablet Take 1 tablet by mouth every morning.  . Calcium Carbonate-Vit D-Min (CALCIUM 600+D PLUS MINERALS PO) Take 1 tablet by mouth daily.  . cetirizine (ZYRTEC) 10 MG tablet Take 10 mg by mouth daily.  . chlorthalidone (HYGROTON) 25 MG tablet Take 25 mg by mouth daily.  . Chromium-Cinnamon (CINNAMON PLUS CHROMIUM) [phone] MCG-MG CAPS Take 1 capsule by mouth daily.  Marland Kitchen CRANBERRY PO  Take 300 mg by mouth daily.  . famotidine (PEPCID) 20 MG tablet Take 20 mg by mouth at bedtime.  . fluticasone (FLONASE) 50 MCG/ACT nasal spray Place 2 sprays into both nostrils daily.  Marland Kitchen glucosamine-chondroitin 500-400 MG tablet Take 2 tablets by mouth daily.   Marland Kitchen latanoprost (XALATAN) 0.005 % ophthalmic solution Place 1 drop into both eyes at bedtime.  Marland Kitchen LORazepam (ATIVAN) 0.5 MG tablet Take 1 tablet by mouth daily as needed.  Marland Kitchen olmesartan (BENICAR) 40 MG tablet Take 40 mg by mouth every morning.  . pantoprazole (PROTONIX) 40 MG tablet TAKE 1 TABLET BY MOUTH ONCE DAILY 30 TO 60 MINUTES BEFORE FIRST MEAL OF THE DAY.  Marland Kitchen potassium chloride SA (K-DUR,KLOR-CON) 20 MEQ tablet Take 20 mEq by mouth every morning.  Marland Kitchen PREMARIN vaginal cream Place 1 Applicatorful vaginally once a week.  . simvastatin (ZOCOR) 20 MG tablet Take 20 mg by mouth every evening.  . timolol (BETIMOL) 0.25 % ophthalmic solution 1-2 drops daily.            Objective:   Physical Exam Pleasant healthy appearing amb wf nad     06/09/2020         147  06/10/2019         155  06/06/2018       153  12/07/2017       148  09/05/2017     149   07/18/2017       145   06/02/17 152 lb (68.9 kg)  09/29/16 156 lb 6.4 oz (70.9 kg)  09/09/14 150 lb (68 kg)    Vital signs reviewed  06/09/2020  - Note at rest 02 sats  98% on RA       HEENT : pt wearing mask not removed for exam due to covid -19 concerns.    NECK :  without JVD/Nodes/TM/ nl carotid upstrokes bilaterally   LUNGS: no acc muscle use,  Nl contour chest which is clear to A and P bilaterally without cough on insp or exp maneuvers   CV:  RRR  no s3 or murmur or increase in P2, and trace pitting edeam R > L LE   ABD:  soft and nontender with nl inspiratory excursion in the supine position. No bruits or organomegaly appreciated, bowel sounds nl  MS:  Nl gait/ ext warm with DJD changes both hands, calf tenderness, cyanosis or clubbing No obvious joint restrictions   SKIN:  warm and dry without lesions    NEURO:  alert, approp, nl sensorium with  no motor or cerebellar deficits apparent.        CXR PA and Lateral:   06/09/2020 :    I personally reviewed images and agree with radiology impression as follows:    Mild diffusely  increased interstitial lung markings with very mild right basilar atelectasis and/or infiltrate.           Assessment:

## 2020-06-09 NOTE — Patient Instructions (Addendum)
Please remember to go to the  x-ray department  for your tests - we will call you with the results when they are available    Ok to try off pantoprazole in am and replace it with pepcid 20 mg taken after the first and last meal   Please schedule a follow up visit in 12 months but call sooner if needed  - ok to use [place] office  Late add: proceed with hrct then ? Need for fob with navigational bx vs just bal for [facility]

## 2020-06-10 ENCOUNTER — Encounter: Payer: Self-pay | Admitting: Internal Medicine

## 2020-06-10 ENCOUNTER — Telehealth: Payer: Self-pay | Admitting: Internal Medicine

## 2020-06-10 DIAGNOSIS — R918 Other nonspecific abnormal finding of lung field: Secondary | ICD-10-CM

## 2020-06-10 MED ORDER — FAMOTIDINE 20 MG PO TABS: 20.0000 mg | ORAL_TABLET | Freq: Two times a day (BID) | ORAL | 0 refills | Status: AC

## 2020-06-10 NOTE — Assessment & Plan Note (Signed)
CT 04/26/17 -1. Numerous bilateral pulmonary nodules ranging in size from 2 mm up to 1.9 cm. Several of the nodules are associated with tubular opacities likely representing impacted bronchials, as well as regions of tree-in-bud micro nodularity - note most accessible for bx = RUL apical segment  - CXR 06/02/2017 wnl/ esr only 29  But  43 07/18/2017  - Quantiferon GOLD TB 07/18/2017 :  Neg - ESR 09/05/2017  =  45  >  Down to 27 06/06/2018 with no change on cxr > rec repeat cxr 12 months unless new chest symptoms in meantime  > no change 06/10/2019 > slt more prominent markings 06/09/2020 > rec HRCT   Given chronicity and lack of symptom progression she most likely has slowlgy progressive MAI but will need HRCT and consideration for FOB prior to consideration of any longterm antibiotic rx which I prefer to leave to ID if we go that route given the likelihood of side effects in the elderly.  Discussed in detail all the  indications, usual  risks and alternatives  relative to the benefits with patient who agrees to proceed with w/u as outlined.

## 2020-06-10 NOTE — Progress Notes (Signed)
LMTCB

## 2020-06-10 NOTE — Assessment & Plan Note (Signed)
Onset 2017 Allergy profile 06/02/2017 >  Eos 0.2 /  IgE 12 neg RAST  rx for gerd 06/02/2017 >>> 75% improved 07/18/2017 > nearly 100% by 09/05/2017 > resolved 12/07/2017   - 06/09/2020 ok to d/c ppi and replace with pepcid bid   Clearly responded on ppi.  Discussed the recent press about ppi's in the context of a statistically significant (but questionably clinically relevant) increase in CRI in pts on ppi vs h2's > bottom line is the lowest dose of ppi that controls   gerd is the right dose and if that dose is zero that's fine esp since h2's are cheaper.    try bid pepcid/ diet/ reviewed         Each maintenance medication was reviewed in detail including emphasizing most importantly the difference between maintenance and prns and under what circumstances the prns are to be triggered using an action plan format where appropriate.  Total time for H and P, chart review, counseling  and generating customized AVS unique to this office visit / charting =  31 min

## 2020-06-10 NOTE — Telephone Encounter (Signed)
Reviewed CXR results with pt. Explained CT recommendation and placed order for HRCT at [facility]. Pt stated understating. Pt also requested refill for Pepcid related to conversation with Dr. Melvyn Novas yesterday. Updated Pepcid order was placed. Nothing further needed at this time.

## 2020-06-18 ENCOUNTER — Other Ambulatory Visit: Payer: Self-pay

## 2020-06-18 ENCOUNTER — Ambulatory Visit (HOSPITAL_COMMUNITY)
Admission: RE | Admit: 2020-06-18 | Discharge: 2020-06-18 | Disposition: A | Discharge status: Home / Self Care | Payer: Medicare Other | Source: Ambulatory Visit | Attending: Internal Medicine | Admitting: Internal Medicine

## 2020-06-18 DIAGNOSIS — R918 Other nonspecific abnormal finding of lung field: Secondary | ICD-10-CM | POA: Diagnosis present

## 2020-06-19 ENCOUNTER — Other Ambulatory Visit: Payer: Self-pay | Admitting: Internal Medicine

## 2020-06-19 DIAGNOSIS — E041 Nontoxic single thyroid nodule: Secondary | ICD-10-CM

## 2020-06-19 NOTE — Progress Notes (Signed)
I spoke with the pt and notified of results and recs per MW. She verbalized understanding. I have placed recall for [facility] for 3 months and also ordered the thyroid of her [place].

## 2020-06-25 ENCOUNTER — Other Ambulatory Visit: Payer: Self-pay

## 2020-06-25 ENCOUNTER — Ambulatory Visit (HOSPITAL_COMMUNITY)
Admission: RE | Admit: 2020-06-25 | Discharge: 2020-06-25 | Disposition: A | Discharge status: Home / Self Care | Payer: Medicare Other | Source: Ambulatory Visit | Attending: Internal Medicine | Admitting: Internal Medicine

## 2020-06-25 DIAGNOSIS — E041 Nontoxic single thyroid nodule: Secondary | ICD-10-CM | POA: Insufficient documentation

## 2020-06-26 ENCOUNTER — Telehealth: Payer: Self-pay | Admitting: Internal Medicine

## 2020-06-26 ENCOUNTER — Encounter: Payer: Self-pay | Admitting: Internal Medicine

## 2020-06-26 DIAGNOSIS — E041 Nontoxic single thyroid nodule: Secondary | ICD-10-CM | POA: Insufficient documentation

## 2020-06-26 NOTE — Telephone Encounter (Signed)
Called and spoke with patient about thyroid results. Let her know that we were going to pass along results to PCP Dr. Willey Blade and see how he wants to proceed. Patient expressed understanding. Ultrasound sent to PCP. Will forward message as an FYI also. Nothing further needed at this time.

## 2020-06-30 NOTE — Progress Notes (Signed)
I called and spoke with the pt and notified of results and recommendations. She states has appt with Dr Willey Blade soon and will discuss with him and get his office to set up and if they are unable to she will call us back. Will route to Dr Willey Blade.

## 2020-07-06 ENCOUNTER — Telehealth: Payer: Self-pay | Admitting: Internal Medicine

## 2020-07-06 NOTE — Telephone Encounter (Signed)
Called and spoke with Patient.  Patient asked if she could have biopsy in [place] instead of [place].  I explained to Patient that a message had been sent to her PCP from Dr Melvyn Novas, and someone would be contacting her, but she should call PCP, if she has not heard anything from them in the next day.  Understanding stated. Nothing further at this time.

## 2020-07-07 ENCOUNTER — Other Ambulatory Visit (HOSPITAL_COMMUNITY): Payer: Self-pay | Admitting: Internal Medicine

## 2020-07-07 DIAGNOSIS — E041 Nontoxic single thyroid nodule: Secondary | ICD-10-CM

## 2020-07-21 ENCOUNTER — Other Ambulatory Visit: Payer: Self-pay | Admitting: Internal Medicine

## 2020-07-21 DIAGNOSIS — R918 Other nonspecific abnormal finding of lung field: Secondary | ICD-10-CM

## 2020-07-22 ENCOUNTER — Encounter (HOSPITAL_COMMUNITY): Payer: Self-pay

## 2020-07-22 ENCOUNTER — Other Ambulatory Visit: Payer: Self-pay

## 2020-07-22 ENCOUNTER — Ambulatory Visit (HOSPITAL_COMMUNITY)
Admission: RE | Admit: 2020-07-22 | Discharge: 2020-07-22 | Disposition: A | Discharge status: Home / Self Care | Payer: Medicare Other | Source: Ambulatory Visit | Attending: Internal Medicine | Admitting: Internal Medicine

## 2020-07-22 DIAGNOSIS — E041 Nontoxic single thyroid nodule: Secondary | ICD-10-CM | POA: Diagnosis not present

## 2020-07-22 MED ORDER — LIDOCAINE HCL (PF) 2 % IJ SOLN
INTRAMUSCULAR | Status: AC
  Filled 2020-07-22: qty 10

## 2020-07-23 LAB — CYTOLOGY - NON PAP

## 2020-08-05 ENCOUNTER — Telehealth: Payer: Self-pay | Admitting: Internal Medicine

## 2020-08-05 NOTE — Telephone Encounter (Signed)
Right now the moderna is not officially approved or recommended for booster but many are getting it anyway.  If she really wants it she should try another drugstore that has moderna or look at the [email or web address]-allen.biz/ website for more info    She is only techically compromised by age and dx of DM, not severe lung dz or a problem of her immune system (like a transplant pt would have)

## 2020-08-05 NOTE — Telephone Encounter (Signed)
Spoke with the pt and notified of response per Dr Wert and she verbalized understanding. Nothing further needed.  ?

## 2020-08-05 NOTE — Telephone Encounter (Signed)
Spoke with the pt  She states that she went to the pharmacy at [facility] to get her covid booster on 08/03/20  She was advised by the pharmacist that she did not qualify b/c she is not immunocompromised I advised she may need to check back with pharmacy bc I believe she meets criteria to her get her vaccine now bc she is over the age of 45  She wants to know if Dr Melvyn Novas thinks she is immunocompromised Please advise thanks!

## 2020-09-08 ENCOUNTER — Other Ambulatory Visit: Payer: Self-pay

## 2020-09-08 ENCOUNTER — Ambulatory Visit: Payer: Medicare Other | Admitting: Internal Medicine

## 2020-09-08 ENCOUNTER — Encounter: Payer: Self-pay | Admitting: Internal Medicine

## 2020-09-08 DIAGNOSIS — R918 Other nonspecific abnormal finding of lung field: Secondary | ICD-10-CM

## 2020-09-08 DIAGNOSIS — R058 Other specified cough: Secondary | ICD-10-CM

## 2020-09-08 DIAGNOSIS — E041 Nontoxic single thyroid nodule: Secondary | ICD-10-CM | POA: Diagnosis not present

## 2020-09-08 NOTE — Assessment & Plan Note (Addendum)
Onset 2017 Allergy profile 06/02/2017 >  Eos 0.2 /  IgE 12 neg RAST  rx for gerd 06/02/2017 >>> 75% improved 07/18/2017 > nearly 100% by 09/05/2017 > resolved 12/07/2017   - 06/09/2020 ok to d/c ppi and replace with pepcid bid > 09/08/2020 worse off gerd rx so restarted   rec continue gerd rx per PCP,  GI w/u at PCP discretion           Each maintenance medication was reviewed in detail including emphasizing most importantly the difference between maintenance and prns and under what circumstances the prns are to be triggered using an action plan format where appropriate.  Total time for H and P, chart review, counseling,  and generating customized AVS unique to this summary  office visit / charting = > 30 min

## 2020-09-08 NOTE — Patient Instructions (Signed)
Return here as needed for persistent cough or worse breathing    If you are satisfied with your treatment plan,  let your doctor know and he/she can either refill your medications or you can return here when your prescription runs out.     If in any way you are not 100% satisfied,  please tell us.  If 100% better, tell your friends!  Pulmonary follow up is as needed  - we can cancel your follow up here

## 2020-09-08 NOTE — Assessment & Plan Note (Signed)
Discovered on CT chest  06/18/20  -  U/s thryoid 06/26/2020 > bx rec >  Done 07/22/20 : Consistent with benign follicular nodule

## 2020-09-08 NOTE — Progress Notes (Signed)
Subjective:     Patient ID: Terri Mccall, female   DOB: 1934/07/09    MRN: [id number]    Brief patient profile:  27 yowf never regular smoker quit completely early 20s with  Cough and abn CT chest >  referred to pulmonary clinic 06/02/2017 by Dr   Willey Blade with [facility]   History of Present Illness  06/02/2017 1st [facility] office visit/ Terri Mccall   Chief Complaint  Patient presents with  . Pulmonary Consult    Referred by Dr. Asencion Noble for eval of pulmonary nodules. Pt c/o cough off and on for the past year. She states that she gets "tickle" in her throat.   dry cough indolent onset  is variably severe  x one year but more present than not x one month like a tickle in her throat  Not noct/ does not wake her or flare in early am hours   Better p clears throat / not really responsive to otc's including zyrtec  Not limited by breathing from desired activities   rec Pantoprazole (protonix) 40 mg   Take  30-60 min before first meal of the day and Pepcid (famotidine)  20 mg one @  bedtime until return to office -   GERD diet      06/10/2019  f/u ov/Terri Mccall re:  Mpns, uacs  Chief Complaint  Patient presents with  . Follow-up    Breathing is overall doing well and no new co's.   Dyspnea:  In heat uphill some doe = baseline Cough: not much at all anymore, sometime with voice use suddenly loses voice transiently then quickly recovers, but only happens a few times a year  Sleeping: fine one pillow  SABA use: none 02: none  rec cxr yearly   06/09/2020  f/u ov/Terri Mccall re: mpn's  / uacs  Chief Complaint  Patient presents with  . Follow-up    Breathing is overall doing well. She states only gets winded after eating a meal and walking up stairs.   Dyspnea: Not limited by breathing from desired activities   Cough: with certain foods  eg pomento cheese / ice cream Sleeping: flat bed a few pillows  SABA use: none  02: no rec Please remember to go to the  x-ray department  for your tests - we will call  you with the results when they are available   Ok to try off pantoprazole in am and replace it with pepcid 20 mg taken after the first and last meal >> cough worse so resumed gerd rx and improved   09/08/2020  f/u ov/Terri Mccall re:  Mild MAI Chief Complaint  Patient presents with  . Follow-up    shortness of breath with certain activities   Dyspnea: does fine waling on flat surfaces, just trouble on multiple steps and steep hills   Cough: only when eats the wrong foods, better on gerd rx  Sleeping:bed blocks / 2 pillows  SABA use: none  02: none    No obvious day to day or daytime variability or assoc excess/ purulent sputum or mucus plugs or hemoptysis or cp or chest tightness, subjective wheeze or overt sinus or hb symptoms.   Sleeping  without nocturnal  or early am exacerbation  of respiratory  c/o's or need for noct saba. Also denies any obvious fluctuation of symptoms with weather or environmental changes or other aggravating or alleviating factors except as outlined above   No unusual exposure hx or h/o childhood pna/ asthma or knowledge  of premature birth.  Current Allergies, Complete Past Medical History, Past Surgical History, Family History, and Social History were reviewed in [employer] record.  ROS  The following are not active complaints unless bolded Hoarseness, sore throat, dysphagia, dental problems, itching, sneezing,  nasal congestion or discharge of excess mucus or purulent secretions, ear ache,   fever, chills, sweats, unintended wt loss or wt gain, classically pleuritic or exertional cp,  orthopnea pnd or arm/hand swelling  or leg swelling, presyncope, palpitations, abdominal pain, anorexia, nausea, vomiting, diarrhea  or change in bowel habits or change in bladder habits, change in stools or change in urine, dysuria, hematuria,  rash, arthralgias, visual complaints, headache, numbness, weakness or ataxia or problems with walking or coordination,   change in mood or  memory.        Current Meds  Medication Sig  . aspirin EC 81 MG tablet Take 81 mg by mouth every evening.  Marland Kitchen atenolol (TENORMIN) 12.5 mg TABS tablet Take 12.5 tablets by mouth 2 (two) times daily.  . beta carotene w/minerals (OCUVITE) tablet Take 1 tablet by mouth every morning.  . Calcium Carbonate-Vit D-Min (CALCIUM 600+D PLUS MINERALS PO) Take 1 tablet by mouth daily.  . cetirizine (ZYRTEC) 10 MG tablet Take 10 mg by mouth daily.  . chlorthalidone (HYGROTON) 25 MG tablet Take 25 mg by mouth daily.  . Chromium-Cinnamon (CINNAMON PLUS CHROMIUM) [phone] MCG-MG CAPS Take 1 capsule by mouth daily.  Marland Kitchen CRANBERRY PO Take 300 mg by mouth daily.  . famotidine (PEPCID) 20 MG tablet Take 1 tablet (20 mg total) by mouth 2 (two) times daily.  . fluticasone (FLONASE) 50 MCG/ACT nasal spray Place 2 sprays into both nostrils daily.  Marland Kitchen glucosamine-chondroitin 500-400 MG tablet Take 2 tablets by mouth daily.   Marland Kitchen latanoprost (XALATAN) 0.005 % ophthalmic solution Place 1 drop into both eyes at bedtime.  Marland Kitchen LORazepam (ATIVAN) 0.5 MG tablet Take 1 tablet by mouth daily as needed.  Marland Kitchen olmesartan (BENICAR) 40 MG tablet Take 40 mg by mouth every morning.  . pantoprazole (PROTONIX) 40 MG tablet TAKE 1 TABLET BY MOUTH ONCE DAILY 30 TO 60 MINUTES BEFORE FIRST MEAL OF THE DAY.  Marland Kitchen potassium chloride SA (K-DUR,KLOR-CON) 20 MEQ tablet Take 20 mEq by mouth every morning.  Marland Kitchen PREMARIN vaginal cream Place 1 Applicatorful vaginally once a week.  . simvastatin (ZOCOR) 20 MG tablet Take 20 mg by mouth every evening.  . timolol (BETIMOL) 0.25 % ophthalmic solution 1-2 drops daily.                     Objective:   Physical Exam  Pleasant amb wf nad     09/08/2020      149 06/09/2020         147  06/10/2019         155  06/06/2018       153  12/07/2017       148  09/05/2017     149   07/18/2017       145   06/02/17 152 lb (68.9 kg)  09/29/16 156 lb 6.4 oz (70.9 kg)  09/09/14 150 lb (68 kg)    Vital signs reviewed  09/08/2020  - Note at rest 02 sats  97% on RA      HEENT : pt wearing mask not removed for exam due to covid -19 concerns.    NECK :  without JVD/Nodes/TM/ nl carotid upstrokes bilaterally   LUNGS:  no acc muscle use,  Nl contour chest which is clear to A and P bilaterally without cough on insp or exp maneuvers   CV:  RRR  no s3 or murmur or increase in P2, and trace pitting R > L LE  ABD:  soft and nontender with nl inspiratory excursion in the supine position. No bruits or organomegaly appreciated, bowel sounds nl  MS:  Nl gait/ ext warm with DJD changes both hands , calf tenderness, cyanosis or clubbing No obvious joint restrictions   SKIN: warm and dry without lesions    NEURO:  alert, approp, nl sensorium with  no motor or cerebellar deficits apparent.        I personally reviewed images and agree with radiology impression as follows:   Chest HRCT 06/18/20  1. No evidence of fibrotic interstitial lung disease. 2. Air trapping is indicative of small airways disease. 3. Scattered peribronchovascular nodularity, increased from 04/26/2017. Finding may be due to mycobacterium avium complex or other chronic atypical/mycobacterial infectious process.      Assessment:

## 2020-09-08 NOTE — Assessment & Plan Note (Signed)
CT 04/26/17 -1. Numerous bilateral pulmonary nodules ranging in size from 2 mm up to 1.9 cm. Several of the nodules are associated with tubular opacities likely representing impacted bronchials, as well as regions of tree-in-bud micro nodularity - note most accessible for bx = RUL apical segment  - CXR 06/02/2017 wnl/ esr only 29  But  43 07/18/2017  - Quantiferon GOLD TB 07/18/2017 :  Neg - ESR 09/05/2017  =  45  >  Down to 27 06/06/2018 with no change on cxr > rec repeat cxr 12 months unless new chest symptoms in meantime  > no change 06/10/2019 > slt more prominent markings 06/09/2020 > rec HRCT   HRCT chest 06/19/2020   Very minimal increase c/w MAI    This is an extremely common benign condition in the elderly and does not warrant aggressive eval/ rx at this point unless there is a clinical correlation suggesting unaddressed pulmonary infection (purulent sputum, night sweats, unintended wt loss, doe) or evolution of  obvious changes on plain cxr (as opposed to serial CT, which is way over sensitive to make clinical decisions re intervention and treatment in the elderly, who tend to tolerate both dx and treatment poorly) .   Discussed in detail all the  indications, usual  risks and alternatives  relative to the benefits with patient who agrees to proceed with conservative f/u as outlined    Pulmonary f/u is prn

## 2020-12-08 ENCOUNTER — Other Ambulatory Visit: Payer: Self-pay

## 2020-12-08 ENCOUNTER — Ambulatory Visit: Payer: Medicare Other | Admitting: Podiatry

## 2020-12-08 DIAGNOSIS — L989 Disorder of the skin and subcutaneous tissue, unspecified: Secondary | ICD-10-CM

## 2020-12-08 DIAGNOSIS — M79671 Pain in right foot: Secondary | ICD-10-CM | POA: Diagnosis not present

## 2020-12-08 NOTE — Progress Notes (Signed)
   Subjective: 87 y.o. female presenting to the office today with a chief complaint of burning, stinging corns to the 4th and 5th toes of the right foot. Wearing closed-toed shoes increases the pain. She has used toe pads and applied Neosporin for treatment. Patient is here for further evaluation and treatment.    Past Medical History:  Diagnosis Date  . Arthritis   . Carpal tunnel syndrome of left wrist   . Constipation   . GERD (gastroesophageal reflux disease)   . Hyperlipidemia   . Hypertension   . Pre-diabetes      Objective:  Physical Exam General: Alert and oriented x3 in no acute distress  Dermatology: Hyperkeratotic lesion(s) present on the right 4th and 5th toes. Pain on palpation with a central nucleated core noted. Skin is warm, dry and supple bilateral lower extremities. Negative for open lesions or macerations.  Vascular: Palpable pedal pulses bilaterally. No edema or erythema noted. Capillary refill within normal limits.  Neurological: Epicritic and protective threshold grossly intact bilaterally.   Musculoskeletal Exam: Pain on palpation at the keratotic lesion(s) noted. Range of motion within normal limits bilateral. Muscle strength 5/5 in all groups bilateral.  Assessment: 1. Pre-ulcerative callus lesions noted to the right 4th and 5th toes    Plan of Care:  1. Patient evaluated 2. Excisional debridement of keratoic lesion(s) using a chisel blade was performed without incident.  3. Dressed area with light dressing. 4. Silicone toe caps dispensed.  5. Patient is to return to the clinic PRN.   Edrick Kins, DPM [facility]  Dr. Edrick Kins, DPM    [address], [place] [address]                Office [phone]  Fax [phone]

## 2021-02-09 ENCOUNTER — Other Ambulatory Visit: Payer: Self-pay

## 2021-02-09 ENCOUNTER — Ambulatory Visit: Payer: Medicare Other | Admitting: Podiatry

## 2021-02-09 DIAGNOSIS — M79671 Pain in right foot: Secondary | ICD-10-CM | POA: Diagnosis not present

## 2021-02-09 DIAGNOSIS — L989 Disorder of the skin and subcutaneous tissue, unspecified: Secondary | ICD-10-CM | POA: Diagnosis not present

## 2021-02-09 NOTE — Progress Notes (Signed)
   Subjective: 87 y.o. female presenting to the office today with a chief complaint of burning, stinging corns to the 4th and 5th toes of the right foot. Wearing closed-toed shoes increases the pain. She has used toe pads and applied Neosporin for treatment. Patient is here for further evaluation and treatment.    Past Medical History:  Diagnosis Date  . Arthritis   . Carpal tunnel syndrome of left wrist   . Constipation   . GERD (gastroesophageal reflux disease)   . Hyperlipidemia   . Hypertension   . Pre-diabetes      Objective:  Physical Exam General: Alert and oriented x3 in no acute distress  Dermatology: Hyperkeratotic lesion(s) present on the right 4th and 5th toes. Pain on palpation with a central nucleated core noted. Skin is warm, dry and supple bilateral lower extremities. Negative for open lesions or macerations.  Vascular: Palpable pedal pulses bilaterally. No edema or erythema noted. Capillary refill within normal limits.  Neurological: Epicritic and protective threshold grossly intact bilaterally.   Musculoskeletal Exam: Pain on palpation at the keratotic lesion(s) noted. Range of motion within normal limits bilateral. Muscle strength 5/5 in all groups bilateral.  Assessment: 1. Pre-ulcerative callus lesions noted to the right 4th and 5th toes and right great toe   Plan of Care:  1. Patient evaluated 2. Excisional debridement of keratoic lesion(s) using a chisel blade was performed without incident.  3.  Additional Silicone toe caps dispensed.  4. Patient is to return to the clinic PRN.   Edrick Kins, DPM [facility]  Dr. Edrick Kins, DPM    [address], [place] [address]                Office [phone]  Fax [phone]

## 2021-07-21 ENCOUNTER — Encounter: Payer: Medicare Other | Attending: Internal Medicine | Admitting: Nutrition

## 2021-07-21 ENCOUNTER — Other Ambulatory Visit: Payer: Self-pay

## 2021-07-21 DIAGNOSIS — E118 Type 2 diabetes mellitus with unspecified complications: Secondary | ICD-10-CM

## 2021-07-21 DIAGNOSIS — E041 Nontoxic single thyroid nodule: Secondary | ICD-10-CM | POA: Insufficient documentation

## 2021-07-21 NOTE — Progress Notes (Signed)
   Medical Nutrition Therapy  Appointment Start time:  [id number]  Appointment End time:  1630  Primary concerns today: Diabetes   Referral diagnosis: E11.8  Preferred learning style: No preference  Learning readiness: none   NUTRITION ASSESSMENT  FBS: 102-112 mg/dl  A1C 6.2%. Diet controlled DM. Has some ophthalmic manifestations and kidney complications.  Anthropometrics  Wt Readings from Last 3 Encounters:  09/08/20 149 lb 12.8 oz (67.9 kg)  06/09/20 147 lb 12.8 oz (67 kg)  06/10/19 155 lb (70.3 kg)   Ht Readings from Last 3 Encounters:  09/08/20 5' (1.524 m)  06/09/20 '5\' 1"'$  (1.549 m)  06/10/19 '5\' 1"'$  (1.549 m)   There is no height or weight on file to calculate BMI. '@BMIFA'$ @ Facility age limit for growth percentiles is 20 years. Facility age limit for growth percentiles is 20 years.    Clinical Medical Hx: DM Type 2, HTN,  Hyperlipidemia Medications: See chart Labs: .A1C 6.2% Notable Signs/Symptoms: None  Lifestyle & Dietary Hx Lives with her husband. Eats mostly at home but goes out to eat 3-4 times per week. Likes to garden and be outside some. Has been afraid of eating healthy carbohydrates. Is following a lower fat, lower sodium diet. Is health conscious.  Estimated daily fluid intake: 48 oz Supplements: Cinnamon, Cranberry, Calcium, Potassium Sleep: Good Stress / self-care: none Current average weekly physical activity: ADL  24-Hr Dietary Recall First Meal: Cherrrios, milk and 1/2 banana, 2-3 oz cranberry juice LS, water and coffee Snack: Second Meal: PImento cheese sandwich on wheat bread, SF Lemonade;  Snack: Applesusauce 1/2c Third Meal: BBQ, coleslaw, 1 hushpuppie, water Snack:  Beverages: water SF Lemonade  Estimated Energy Needs Calories: 1200 Carbohydrate: 135g Protein: 90g Fat: 33g   NUTRITION DIAGNOSIS  NB-1.1 Food and nutrition-related knowledge deficit As related to Diabetes Type 2.  As evidenced by A1C 6.2%, but it has been higher than that  in the past. Controlling it with diet and no medicaitons.   NUTRITION INTERVENTION  Nutrition education (E-1) on the following topics:  Nutrition and Diabetes education provided on My Plate, CHO counting, meal planning, portion sizes, timing of meals, avoiding snacks between meals unless having a low blood sugar, target ranges for A1C and blood sugars, signs/symptoms and treatment of hyper/hypoglycemia, monitoring blood sugars, taking medications as prescribed, benefits of exercising 30 minutes per day and prevention of complications of DM.   Handouts Provided Include  My Plate Meal Plan Card Diabetes Instructions.   Learning Style & Readiness for Change Teaching method utilized: Visual & Auditory  Demonstrated degree of understanding via: Teach Back  Barriers to learning/adherence to lifestyle change: none  Goals Established by Pt Keep up the great job!!  Eat three meals per day Eat 2 carb choices per meal Keep drinking water Cut out juices. Choose whole grain, high fiber foods. Continue to increase fresh fruits and vegetables.   MONITORING & EVALUATION Dietary intake, weekly physical activity, and weight in 3 months.  Next Steps  Patient is to continue to eat three meals per day.Marland Kitchen

## 2021-07-26 ENCOUNTER — Other Ambulatory Visit: Payer: Self-pay | Admitting: Internal Medicine

## 2021-07-26 DIAGNOSIS — M5416 Radiculopathy, lumbar region: Secondary | ICD-10-CM

## 2021-08-04 ENCOUNTER — Other Ambulatory Visit: Payer: Self-pay

## 2021-08-04 ENCOUNTER — Ambulatory Visit (HOSPITAL_COMMUNITY)
Admission: RE | Admit: 2021-08-04 | Discharge: 2021-08-04 | Disposition: A | Discharge status: Home / Self Care | Payer: Medicare Other | Source: Ambulatory Visit | Attending: Internal Medicine | Admitting: Internal Medicine

## 2021-08-04 DIAGNOSIS — M5416 Radiculopathy, lumbar region: Secondary | ICD-10-CM | POA: Diagnosis present

## 2021-08-09 ENCOUNTER — Other Ambulatory Visit: Payer: Self-pay | Admitting: Internal Medicine

## 2021-08-09 DIAGNOSIS — R918 Other nonspecific abnormal finding of lung field: Secondary | ICD-10-CM

## 2021-08-27 ENCOUNTER — Other Ambulatory Visit: Payer: Self-pay | Admitting: Neurosurgery

## 2021-09-02 ENCOUNTER — Encounter: Payer: Self-pay | Admitting: Nutrition

## 2021-09-02 NOTE — Patient Instructions (Signed)
Goals Established by Pt Keep up the great job!!   Eat three meals per day Eat 2 carb choices per meal Keep drinking water Cut out juices. Choose whole grain, high fiber foods. Continue to increase fresh fruits and vegetables.

## 2021-09-07 ENCOUNTER — Other Ambulatory Visit: Payer: Self-pay | Admitting: Neurosurgery

## 2021-09-10 NOTE — Pre-Procedure Instructions (Signed)
Surgical Instructions    Your procedure is scheduled on Wednesday, September 15, 2021 at 1:15 PM.  Report to [facility] Main Entrance "A" at 11:15 A.M., then check in with the Admitting office.  Call this number if you have problems the morning of surgery:  [phone]   If you have any questions prior to your surgery date call [phone]: Open Monday-Friday 8am-4pm    Remember:  Do not eat or drink after midnight the night before your surgery   Take these medicines the morning of surgery with A SIP OF WATER:  atenolol (TENORMIN) cetirizine (ZYRTEC) fluticasone (FLONASE) pantoprazole (PROTONIX) timolol (BETIMOL)  IF NEEDED: LORazepam (ATIVAN) Polyethyl Glycol-Propyl Glycol (SYSTANE)  Follow your surgeon's instructions on when to stop Aspirin.  If no instructions were given by your surgeon then you will need to call the office to get those instructions.    As of today, STOP taking any Aleve, Naproxen, Ibuprofen, Motrin, Advil, Goody's, BC's, all herbal medications, fish oil, and all vitamins.   HOW TO MANAGE YOUR DIABETES BEFORE AND AFTER SURGERY  Why is it important to control my blood sugar before and after surgery? Improving blood sugar levels before and after surgery helps healing and can limit problems. A way of improving blood sugar control is eating a healthy diet by:  Eating less sugar and carbohydrates  Increasing activity/exercise  Talking with your doctor about reaching your blood sugar goals High blood sugars (greater than 180 mg/dL) can raise your risk of infections and slow your recovery, so you will need to focus on controlling your diabetes during the weeks before surgery. Make sure that the doctor who takes care of your diabetes knows about your planned surgery including the date and location.  How do I manage my blood sugar before surgery? Check your blood sugar at least 4 times a day, starting 2 days before surgery, to make sure that the level is not too  high or low.  Check your blood sugar the morning of your surgery when you wake up and every 2 hours until you get to the Short Stay unit.  If your blood sugar is less than 70 mg/dL, you will need to treat for low blood sugar: Do not take insulin. Treat a low blood sugar (less than 70 mg/dL) with  cup of clear juice (cranberry or apple), 4 glucose tablets, OR glucose gel. Recheck blood sugar in 15 minutes after treatment (to make sure it is greater than 70 mg/dL). If your blood sugar is not greater than 70 mg/dL on recheck, call [phone] for further instructions. Report your blood sugar to the short stay nurse when you get to Short Stay.  If you are admitted to the hospital after surgery: Your blood sugar will be checked by the staff and you will probably be given insulin after surgery (instead of oral diabetes medicines) to make sure you have good blood sugar levels. The goal for blood sugar control after surgery is 80-180 mg/dL.                      Do NOT Smoke (Tobacco/Vaping) or drink Alcohol 24 hours prior to your procedure.  If you use a CPAP at night, you may bring all equipment for your overnight stay.   Contacts, glasses, piercing's, hearing aid's, dentures or partials may not be worn into surgery, please bring cases for these belongings.    For patients admitted to the hospital, discharge time will be determined by your treatment  team.   Patients discharged the day of surgery will not be allowed to drive home, and someone needs to stay with them for 24 hours.  NO VISITORS WILL BE ALLOWED IN PRE-OP WHERE PATIENTS GET READY FOR SURGERY.  ONLY 1 SUPPORT PERSON MAY BE PRESENT IN THE WAITING ROOM WHILE YOU ARE IN SURGERY.  IF YOU ARE TO BE ADMITTED, ONCE YOU ARE IN YOUR ROOM YOU WILL BE ALLOWED TWO (2) VISITORS.  Minor children may have two parents present. Special consideration for safety and communication needs will be reviewed on a case by case basis.   Special instructions:    [place]- Preparing For Surgery  Before surgery, you can play an important role. Because skin is not sterile, your skin needs to be as free of germs as possible. You can reduce the number of germs on your skin by washing with CHG (chlorahexidine gluconate) Soap before surgery.  CHG is an antiseptic cleaner which kills germs and bonds with the skin to continue killing germs even after washing.    Oral Hygiene is also important to reduce your risk of infection.  Remember - BRUSH YOUR TEETH THE MORNING OF SURGERY WITH YOUR REGULAR TOOTHPASTE  Please do not use if you have an allergy to CHG or antibacterial soaps. If your skin becomes reddened/irritated stop using the CHG.  Do not shave (including legs and underarms) for at least 48 hours prior to first CHG shower. It is OK to shave your face.  Please follow these instructions carefully.   Shower the NIGHT BEFORE SURGERY and the MORNING OF SURGERY  If you chose to wash your hair, wash your hair first as usual with your normal shampoo.  After you shampoo, rinse your hair and body thoroughly to remove the shampoo.  Use CHG Soap as you would any other liquid soap. You can apply CHG directly to the skin and wash gently with a scrungie or a clean washcloth.   Apply the CHG Soap to your body ONLY FROM THE NECK DOWN.  Do not use on open wounds or open sores. Avoid contact with your eyes, ears, mouth and genitals (private parts). Wash Face and genitals (private parts)  with your normal soap.   Wash thoroughly, paying special attention to the area where your surgery will be performed.  Thoroughly rinse your body with warm water from the neck down.  DO NOT shower/wash with your normal soap after using and rinsing off the CHG Soap.  Pat yourself dry with a CLEAN TOWEL.  Wear CLEAN PAJAMAS to bed the night before surgery  Place CLEAN SHEETS on your bed the night before your surgery  DO NOT SLEEP WITH PETS.   Day of Surgery: Shower with CHG  soap. Do not wear jewelry, make up, nail polish, gel polish, artificial nails, or any other type of covering on natural nails including finger and toenails. If patients have artificial nails, gel coating, etc. that need to be removed by a nail salon please have this removed prior to surgery. Surgery may need to be canceled/delayed if the surgeon/ anesthesia feels like the patient is unable to be adequately monitored. Do not wear lotions, powders, perfumes, or deodorant. Do not shave 48 hours prior to surgery. Do not bring valuables to the hospital. [facility] is not responsible for any belongings or valuables. Wear Clean/Comfortable clothing the morning of surgery Remember to brush your teeth WITH YOUR REGULAR TOOTHPASTE.   Please read over the following fact sheets that you were  given.   3 days prior to your procedure or After your COVID test   You are not required to quarantine however you are required to wear a well-fitting mask when you are out and around people not in your household. If your mask becomes wet or soiled, replace with a new one.   Wash your hands often with soap and water for 20 seconds or clean your hands with an alcohol-based hand sanitizer that contains at least 60% alcohol.   Do not share personal items.   Notify your provider:  o if you are in close contact with someone who has COVID  o or if you develop a fever of 100.4 or greater, sneezing, cough, sore throat, shortness of breath or body aches.

## 2021-09-12 ENCOUNTER — Other Ambulatory Visit: Payer: Self-pay | Admitting: Internal Medicine

## 2021-09-12 DIAGNOSIS — R918 Other nonspecific abnormal finding of lung field: Secondary | ICD-10-CM

## 2021-09-13 ENCOUNTER — Other Ambulatory Visit: Payer: Self-pay

## 2021-09-13 ENCOUNTER — Encounter (HOSPITAL_COMMUNITY): Payer: Self-pay

## 2021-09-13 ENCOUNTER — Encounter (HOSPITAL_COMMUNITY)
Admission: RE | Admit: 2021-09-13 | Discharge: 2021-09-13 | Disposition: A | Discharge status: Home / Self Care | Payer: Medicare Other | Source: Ambulatory Visit | Attending: Neurosurgery | Admitting: Neurosurgery

## 2021-09-13 ENCOUNTER — Other Ambulatory Visit: Payer: Self-pay | Admitting: Internal Medicine

## 2021-09-13 VITALS — BP 165/73 | HR 68 | Temp 98.0°F | Resp 17 | Ht 60.0 in | Wt 139.8 lb

## 2021-09-13 DIAGNOSIS — I10 Essential (primary) hypertension: Secondary | ICD-10-CM | POA: Diagnosis not present

## 2021-09-13 DIAGNOSIS — Z01818 Encounter for other preprocedural examination: Secondary | ICD-10-CM | POA: Insufficient documentation

## 2021-09-13 DIAGNOSIS — R7303 Prediabetes: Secondary | ICD-10-CM | POA: Insufficient documentation

## 2021-09-13 DIAGNOSIS — Z20822 Contact with and (suspected) exposure to covid-19: Secondary | ICD-10-CM | POA: Diagnosis not present

## 2021-09-13 DIAGNOSIS — R918 Other nonspecific abnormal finding of lung field: Secondary | ICD-10-CM

## 2021-09-13 LAB — BASIC METABOLIC PANEL
Anion gap: 10 (ref 5–15)
BUN: 10 mg/dL (ref 8–23)
CO2: 25 mmol/L (ref 22–32)
Calcium: 9.8 mg/dL (ref 8.9–10.3)
Chloride: 94 mmol/L — ABNORMAL LOW (ref 98–111)
Creatinine, Ser: 0.6 mg/dL (ref 0.44–1.00)
GFR, Estimated: >60 mL/min (ref >60)
Glucose, Bld: 102 mg/dL — ABNORMAL HIGH (ref 70–99)
Potassium: 3.8 mmol/L (ref 3.5–5.1)
Sodium: 129 mmol/L — ABNORMAL LOW (ref 135–145)

## 2021-09-13 LAB — CBC
HCT: 40.2 % (ref 36.0–46.0)
Hemoglobin: 13.1 g/dL (ref 12.0–15.0)
MCH: 28.9 pg (ref 26.0–34.0)
MCHC: 32.6 g/dL (ref 30.0–36.0)
MCV: 88.7 fL (ref 80.0–100.0)
Platelets: 215 10*3/uL (ref 150–400)
RBC: 4.53 MIL/uL (ref 3.87–5.11)
RDW: 13.7 % (ref 11.5–15.5)
WBC: 6.8 10*3/uL (ref 4.0–10.5)
nRBC: 0 % (ref 0.0–0.2)

## 2021-09-13 LAB — HEMOGLOBIN A1C
Hgb A1c MFr Bld: 6.4 % — ABNORMAL HIGH (ref 4.8–5.6)
Mean Plasma Glucose: 136.98 mg/dL

## 2021-09-13 LAB — SARS CORONAVIRUS 2 (TAT 6-24 HRS): SARS Coronavirus 2: NEGATIVE

## 2021-09-13 LAB — SURGICAL PCR SCREEN
MRSA, PCR: NEGATIVE
Staphylococcus aureus: NEGATIVE

## 2021-09-13 LAB — TYPE AND SCREEN
ABO/RH(D): O POS
Antibody Screen: NEGATIVE

## 2021-09-13 LAB — GLUCOSE, CAPILLARY: Glucose-Capillary: 154 mg/dL — ABNORMAL HIGH (ref 70–99)

## 2021-09-13 NOTE — Progress Notes (Signed)
PCP - Dr. Asencion Noble Cardiologist - denies  PPM/ICD - denies   Chest x-ray - 06/09/20 EKG - 09/13/21 at PAT Stress Test - denies ECHO - denies Cardiac Cath - denies  Sleep Study - denies  DM- Pre-diabetic Fasting Blood Sugar - 110-120 Checks Blood Sugar once a day  Blood Thinner Instructions: n/a Aspirin Instructions: stopped 11/2 per surgeon's instructions  ERAS Protcol - no, NPO   COVID TEST- 09/13/21 at PAT   Anesthesia review: no  Patient denies shortness of breath, fever, cough and chest pain at PAT appointment   All instructions explained to the patient, with a verbal understanding of the material. Patient agrees to go over the instructions while at home for a better understanding. Patient also instructed to wear a mask in public after being tested for COVID-19. The opportunity to ask questions was provided.

## 2021-09-14 NOTE — Anesthesia Preprocedure Evaluation (Addendum)
Anesthesia Evaluation  Patient identified by MRN, date of birth, ID band Patient awake    Reviewed: Allergy & Precautions, NPO status , Patient's Chart, lab work & pertinent test results, reviewed documented beta blocker date and time   Airway Mallampati: II  TM Distance: >3 FB Neck ROM: Full    Dental no notable dental hx. (+) Teeth Intact, Dental Advisory Given, Caps   Pulmonary neg pulmonary ROS,    Pulmonary exam normal breath sounds clear to auscultation       Cardiovascular hypertension, Pt. on medications and Pt. on home beta blockers Normal cardiovascular exam Rhythm:Regular Rate:Normal     Neuro/Psych negative psych ROS   GI/Hepatic Neg liver ROS, GERD  Medicated and Controlled,  Endo/Other  diabetes, Well Controlled, Type 2Pre diabetes, not on any Rx Hyperlipidemia  Renal/GU Hyponatremia- chronic  negative genitourinary   Musculoskeletal  (+) Arthritis , Osteoarthritis,  Spondylolisthesis lumbar   Abdominal   Peds  Hematology negative hematology ROS (+)   Anesthesia Other Findings   Reproductive/Obstetrics                            Anesthesia Physical Anesthesia Plan  ASA: 3  Anesthesia Plan: General   Post-op Pain Management:    Induction: Intravenous  PONV Risk Score and Plan: 4 or greater and Treatment may vary due to age or medical condition and Ondansetron  Airway Management Planned: Oral ETT  Additional Equipment:   Intra-op Plan:   Post-operative Plan: Extubation in OR  Informed Consent: I have reviewed the patients History and Physical, chart, labs and discussed the procedure including the risks, benefits and alternatives for the proposed anesthesia with the patient or authorized representative who has indicated his/her understanding and acceptance.     Dental advisory given  Plan Discussed with: Anesthesiologist and CRNA  Anesthesia Plan Comments:  (Chronic hyponatremia, range 122-131 over past 5 years. Sodium 129 on preop labs. )       Anesthesia Quick Evaluation

## 2021-09-15 ENCOUNTER — Ambulatory Visit (HOSPITAL_COMMUNITY): Payer: Medicare Other

## 2021-09-15 ENCOUNTER — Encounter (HOSPITAL_COMMUNITY): Payer: Self-pay | Admitting: Neurosurgery

## 2021-09-15 ENCOUNTER — Encounter (HOSPITAL_COMMUNITY)
Admission: RE | Disposition: A | Discharge status: Home / Self Care | Payer: Self-pay | Source: Home / Self Care | Attending: Neurosurgery

## 2021-09-15 ENCOUNTER — Ambulatory Visit (HOSPITAL_COMMUNITY): Payer: Medicare Other | Admitting: Certified Registered Nurse Anesthetist

## 2021-09-15 ENCOUNTER — Other Ambulatory Visit: Payer: Self-pay

## 2021-09-15 ENCOUNTER — Observation Stay (HOSPITAL_COMMUNITY)
Admission: RE | Admit: 2021-09-15 | Discharge: 2021-09-17 | Disposition: A | Discharge status: Home / Self Care | Payer: Medicare Other | Attending: Neurosurgery | Admitting: Neurosurgery

## 2021-09-15 ENCOUNTER — Ambulatory Visit (HOSPITAL_COMMUNITY): Payer: Medicare Other | Admitting: Physician Assistant

## 2021-09-15 DIAGNOSIS — E876 Hypokalemia: Secondary | ICD-10-CM | POA: Diagnosis not present

## 2021-09-15 DIAGNOSIS — Z7982 Long term (current) use of aspirin: Secondary | ICD-10-CM | POA: Diagnosis not present

## 2021-09-15 DIAGNOSIS — M48062 Spinal stenosis, lumbar region with neurogenic claudication: Secondary | ICD-10-CM | POA: Diagnosis not present

## 2021-09-15 DIAGNOSIS — E871 Hypo-osmolality and hyponatremia: Secondary | ICD-10-CM | POA: Diagnosis not present

## 2021-09-15 DIAGNOSIS — E119 Type 2 diabetes mellitus without complications: Secondary | ICD-10-CM | POA: Diagnosis not present

## 2021-09-15 DIAGNOSIS — E785 Hyperlipidemia, unspecified: Secondary | ICD-10-CM | POA: Diagnosis present

## 2021-09-15 DIAGNOSIS — M5416 Radiculopathy, lumbar region: Secondary | ICD-10-CM | POA: Insufficient documentation

## 2021-09-15 DIAGNOSIS — Z419 Encounter for procedure for purposes other than remedying health state, unspecified: Secondary | ICD-10-CM

## 2021-09-15 DIAGNOSIS — M4316 Spondylolisthesis, lumbar region: Secondary | ICD-10-CM | POA: Diagnosis present

## 2021-09-15 DIAGNOSIS — I10 Essential (primary) hypertension: Secondary | ICD-10-CM | POA: Insufficient documentation

## 2021-09-15 LAB — GLUCOSE, CAPILLARY
Glucose-Capillary: 111 mg/dL — ABNORMAL HIGH (ref 70–99)
Glucose-Capillary: 93 mg/dL (ref 70–99)

## 2021-09-15 SURGERY — POSTERIOR LUMBAR FUSION 1 LEVEL
Anesthesia: General | Site: Spine Lumbar

## 2021-09-15 MED ORDER — POLYETHYL GLYCOL-PROPYL GLYCOL 0.4-0.3 % OP GEL: 1.0000 "application " | Freq: Every day | OPHTHALMIC | Status: DC

## 2021-09-15 MED ORDER — ONDANSETRON HCL 4 MG/2ML IJ SOLN
INTRAMUSCULAR | Status: AC
  Filled 2021-09-15: qty 2

## 2021-09-15 MED ORDER — LACTATED RINGERS IV SOLN: INTRAVENOUS | Status: DC

## 2021-09-15 MED ORDER — CEFAZOLIN SODIUM-DEXTROSE 2-4 GM/100ML-% IV SOLN
2.0000 g | Freq: Three times a day (TID) | INTRAVENOUS | Status: AC
  Administered 2021-09-15 – 2021-09-16 (×2): 2 g via INTRAVENOUS
  Filled 2021-09-15 (×2): qty 100

## 2021-09-15 MED ORDER — LORAZEPAM 0.5 MG PO TABS
0.2500 mg | ORAL_TABLET | Freq: Every day | ORAL | Status: DC | PRN
  Administered 2021-09-16: 0.25 mg via ORAL
  Filled 2021-09-15: qty 1

## 2021-09-15 MED ORDER — ACETAMINOPHEN 650 MG RE SUPP: 650.0000 mg | RECTAL | Status: DC | PRN

## 2021-09-15 MED ORDER — ONDANSETRON HCL 4 MG PO TABS: 4.0000 mg | ORAL_TABLET | Freq: Four times a day (QID) | ORAL | Status: DC | PRN

## 2021-09-15 MED ORDER — ONDANSETRON HCL 4 MG/2ML IJ SOLN
INTRAMUSCULAR | Status: DC | PRN
  Administered 2021-09-15: 4 mg via INTRAVENOUS

## 2021-09-15 MED ORDER — PROPOFOL 10 MG/ML IV BOLUS
INTRAVENOUS | Status: AC
  Filled 2021-09-15: qty 20

## 2021-09-15 MED ORDER — POLYVINYL ALCOHOL 1.4 % OP SOLN
1.0000 [drp] | Freq: Every day | OPHTHALMIC | Status: DC
  Administered 2021-09-15 – 2021-09-16 (×2): 1 [drp] via OPHTHALMIC
  Filled 2021-09-15: qty 15

## 2021-09-15 MED ORDER — CHLORHEXIDINE GLUCONATE 0.12 % MT SOLN: 15.0000 mL | Freq: Once | OROMUCOSAL | Status: AC

## 2021-09-15 MED ORDER — BUPIVACAINE-EPINEPHRINE 0.5% -1:200000 IJ SOLN
INTRAMUSCULAR | Status: AC
  Filled 2021-09-15: qty 1

## 2021-09-15 MED ORDER — THROMBIN 5000 UNITS EX SOLR
CUTANEOUS | Status: AC
  Filled 2021-09-15: qty 5000

## 2021-09-15 MED ORDER — HYDROMORPHONE HCL 1 MG/ML IJ SOLN
INTRAMUSCULAR | Status: AC
  Filled 2021-09-15: qty 1

## 2021-09-15 MED ORDER — BUPIVACAINE LIPOSOME 1.3 % IJ SUSP
INTRAMUSCULAR | Status: DC | PRN
  Administered 2021-09-15: 20 mL

## 2021-09-15 MED ORDER — LIDOCAINE 2% (20 MG/ML) 5 ML SYRINGE
INTRAMUSCULAR | Status: DC | PRN
  Administered 2021-09-15: 60 mg via INTRAVENOUS

## 2021-09-15 MED ORDER — ONDANSETRON HCL 4 MG/2ML IJ SOLN: 4.0000 mg | Freq: Four times a day (QID) | INTRAMUSCULAR | Status: DC | PRN

## 2021-09-15 MED ORDER — SODIUM CHLORIDE 0.9% FLUSH: 3.0000 mL | INTRAVENOUS | Status: DC | PRN

## 2021-09-15 MED ORDER — PHENYLEPHRINE 40 MCG/ML (10ML) SYRINGE FOR IV PUSH (FOR BLOOD PRESSURE SUPPORT)
PREFILLED_SYRINGE | INTRAVENOUS | Status: DC | PRN
  Administered 2021-09-15: 80 ug via INTRAVENOUS
  Administered 2021-09-15 (×2): 40 ug via INTRAVENOUS
  Administered 2021-09-15: 80 ug via INTRAVENOUS
  Administered 2021-09-15: 120 ug via INTRAVENOUS

## 2021-09-15 MED ORDER — CEFAZOLIN SODIUM-DEXTROSE 2-4 GM/100ML-% IV SOLN
2.0000 g | INTRAVENOUS | Status: AC
  Administered 2021-09-15: 2 g via INTRAVENOUS

## 2021-09-15 MED ORDER — ATENOLOL 50 MG PO TABS: 312.5000 mg | ORAL_TABLET | Freq: Two times a day (BID) | ORAL | Status: DC

## 2021-09-15 MED ORDER — 0.9 % SODIUM CHLORIDE (POUR BTL) OPTIME
TOPICAL | Status: DC | PRN
  Administered 2021-09-15: 1000 mL

## 2021-09-15 MED ORDER — CHLORHEXIDINE GLUCONATE CLOTH 2 % EX PADS: 6.0000 | MEDICATED_PAD | Freq: Once | CUTANEOUS | Status: DC

## 2021-09-15 MED ORDER — POTASSIUM CHLORIDE CRYS ER 20 MEQ PO TBCR
20.0000 meq | EXTENDED_RELEASE_TABLET | Freq: Every morning | ORAL | Status: DC
  Administered 2021-09-16 – 2021-09-17 (×2): 20 meq via ORAL
  Filled 2021-09-15 (×2): qty 1

## 2021-09-15 MED ORDER — OXYCODONE HCL 5 MG PO TABS: 5.0000 mg | ORAL_TABLET | ORAL | Status: DC | PRN

## 2021-09-15 MED ORDER — DEXAMETHASONE SODIUM PHOSPHATE 10 MG/ML IJ SOLN
INTRAMUSCULAR | Status: DC | PRN
  Administered 2021-09-15: 10 mg via INTRAVENOUS

## 2021-09-15 MED ORDER — CEFAZOLIN SODIUM-DEXTROSE 2-4 GM/100ML-% IV SOLN
INTRAVENOUS | Status: AC
  Filled 2021-09-15: qty 100

## 2021-09-15 MED ORDER — ORAL CARE MOUTH RINSE: 15.0000 mL | Freq: Once | OROMUCOSAL | Status: AC

## 2021-09-15 MED ORDER — ONDANSETRON HCL 4 MG/2ML IJ SOLN
4.0000 mg | Freq: Once | INTRAMUSCULAR | Status: AC | PRN
  Administered 2021-09-15: 4 mg via INTRAVENOUS

## 2021-09-15 MED ORDER — OXYCODONE HCL 5 MG PO TABS
10.0000 mg | ORAL_TABLET | ORAL | Status: DC | PRN
  Administered 2021-09-15: 10 mg via ORAL
  Filled 2021-09-15: qty 2

## 2021-09-15 MED ORDER — THROMBIN 5000 UNITS EX SOLR: OROMUCOSAL | Status: DC | PRN

## 2021-09-15 MED ORDER — SODIUM CHLORIDE 0.9% FLUSH
3.0000 mL | Freq: Two times a day (BID) | INTRAVENOUS | Status: DC
  Administered 2021-09-15 – 2021-09-17 (×4): 3 mL via INTRAVENOUS

## 2021-09-15 MED ORDER — FENTANYL CITRATE (PF) 250 MCG/5ML IJ SOLN
INTRAMUSCULAR | Status: AC
  Filled 2021-09-15: qty 5

## 2021-09-15 MED ORDER — MENTHOL 3 MG MT LOZG: 1.0000 | LOZENGE | OROMUCOSAL | Status: DC | PRN

## 2021-09-15 MED ORDER — PROPOFOL 10 MG/ML IV BOLUS
INTRAVENOUS | Status: DC | PRN
  Administered 2021-09-15: 10 mg via INTRAVENOUS
  Administered 2021-09-15: 100 mg via INTRAVENOUS
  Administered 2021-09-15: 10 mg via INTRAVENOUS

## 2021-09-15 MED ORDER — PHENYLEPHRINE HCL-NACL 20-0.9 MG/250ML-% IV SOLN
INTRAVENOUS | Status: DC | PRN
  Administered 2021-09-15: 50 ug/min via INTRAVENOUS

## 2021-09-15 MED ORDER — BACITRACIN ZINC 500 UNIT/GM EX OINT
TOPICAL_OINTMENT | CUTANEOUS | Status: AC
  Filled 2021-09-15: qty 28.35

## 2021-09-15 MED ORDER — DOCUSATE SODIUM 100 MG PO CAPS
100.0000 mg | ORAL_CAPSULE | Freq: Two times a day (BID) | ORAL | Status: DC
  Administered 2021-09-15 – 2021-09-17 (×4): 100 mg via ORAL
  Filled 2021-09-15 (×4): qty 1

## 2021-09-15 MED ORDER — SODIUM CHLORIDE 0.9 % IV SOLN
250.0000 mL | INTRAVENOUS | Status: DC
  Administered 2021-09-15: 250 mL via INTRAVENOUS

## 2021-09-15 MED ORDER — SIMVASTATIN 20 MG PO TABS
20.0000 mg | ORAL_TABLET | Freq: Every evening | ORAL | Status: DC
  Administered 2021-09-16: 20 mg via ORAL
  Filled 2021-09-15: qty 1

## 2021-09-15 MED ORDER — PANTOPRAZOLE SODIUM 20 MG PO TBEC
20.0000 mg | DELAYED_RELEASE_TABLET | Freq: Two times a day (BID) | ORAL | Status: DC
  Administered 2021-09-15 – 2021-09-17 (×4): 20 mg via ORAL
  Filled 2021-09-15 (×4): qty 1

## 2021-09-15 MED ORDER — CYCLOBENZAPRINE HCL 10 MG PO TABS: 10.0000 mg | ORAL_TABLET | Freq: Three times a day (TID) | ORAL | Status: DC | PRN

## 2021-09-15 MED ORDER — HYDROMORPHONE HCL 1 MG/ML IJ SOLN
0.2500 mg | INTRAMUSCULAR | Status: DC | PRN
  Administered 2021-09-15 (×4): 0.5 mg via INTRAVENOUS

## 2021-09-15 MED ORDER — PHENOL 1.4 % MT LIQD: 1.0000 | OROMUCOSAL | Status: DC | PRN

## 2021-09-15 MED ORDER — ATENOLOL 25 MG PO TABS
12.5000 mg | ORAL_TABLET | Freq: Two times a day (BID) | ORAL | Status: DC
  Administered 2021-09-15 – 2021-09-17 (×4): 12.5 mg via ORAL
  Filled 2021-09-15 (×4): qty 1

## 2021-09-15 MED ORDER — ZOLPIDEM TARTRATE 5 MG PO TABS: 5.0000 mg | ORAL_TABLET | Freq: Every evening | ORAL | Status: DC | PRN

## 2021-09-15 MED ORDER — ROCURONIUM BROMIDE 10 MG/ML (PF) SYRINGE
PREFILLED_SYRINGE | INTRAVENOUS | Status: DC | PRN
Start: 2021-09-15 — End: 2021-09-15
  Administered 2021-09-15: 60 mg via INTRAVENOUS

## 2021-09-15 MED ORDER — CHLORHEXIDINE GLUCONATE 0.12 % MT SOLN
OROMUCOSAL | Status: AC
  Administered 2021-09-15: 15 mL via OROMUCOSAL
  Filled 2021-09-15: qty 15

## 2021-09-15 MED ORDER — FLUTICASONE PROPIONATE 50 MCG/ACT NA SUSP
2.0000 | Freq: Every day | NASAL | Status: DC
  Administered 2021-09-16 – 2021-09-17 (×2): 2 via NASAL
  Filled 2021-09-15: qty 16

## 2021-09-15 MED ORDER — LORATADINE 10 MG PO TABS
10.0000 mg | ORAL_TABLET | Freq: Every day | ORAL | Status: DC
  Administered 2021-09-16 – 2021-09-17 (×2): 10 mg via ORAL
  Filled 2021-09-15 (×2): qty 1

## 2021-09-15 MED ORDER — FAMOTIDINE 20 MG PO TABS
10.0000 mg | ORAL_TABLET | Freq: Every day | ORAL | Status: DC
  Administered 2021-09-15 – 2021-09-16 (×2): 10 mg via ORAL
  Filled 2021-09-15 (×2): qty 1

## 2021-09-15 MED ORDER — POLYETHYL GLYCOL-PROPYL GLYCOL 0.4-0.3 % OP SOLN
1.0000 [drp] | Freq: Every day | OPHTHALMIC | Status: DC | PRN
Start: 2021-09-15 — End: 2021-09-15

## 2021-09-15 MED ORDER — BUPIVACAINE-EPINEPHRINE (PF) 0.5% -1:200000 IJ SOLN
INTRAMUSCULAR | Status: DC | PRN
  Administered 2021-09-15: 10 mL

## 2021-09-15 MED ORDER — TIMOLOL HEMIHYDRATE 0.25 % OP SOLN: 1.0000 [drp] | Freq: Every day | OPHTHALMIC | Status: DC

## 2021-09-15 MED ORDER — IRBESARTAN 300 MG PO TABS
300.0000 mg | ORAL_TABLET | Freq: Every day | ORAL | Status: DC
  Administered 2021-09-15 – 2021-09-17 (×3): 300 mg via ORAL
  Filled 2021-09-15 (×2): qty 1

## 2021-09-15 MED ORDER — POLYVINYL ALCOHOL 1.4 % OP SOLN
1.0000 [drp] | Freq: Every day | OPHTHALMIC | Status: DC | PRN
  Filled 2021-09-15: qty 15

## 2021-09-15 MED ORDER — CHLORTHALIDONE 25 MG PO TABS
25.0000 mg | ORAL_TABLET | Freq: Every day | ORAL | Status: DC
  Administered 2021-09-15 – 2021-09-16 (×2): 25 mg via ORAL
  Filled 2021-09-15 (×3): qty 1

## 2021-09-15 MED ORDER — ACETAMINOPHEN 500 MG PO TABS
1000.0000 mg | ORAL_TABLET | Freq: Four times a day (QID) | ORAL | Status: AC
  Administered 2021-09-15 – 2021-09-16 (×4): 1000 mg via ORAL
  Filled 2021-09-15 (×5): qty 2

## 2021-09-15 MED ORDER — ACETAMINOPHEN 325 MG PO TABS
650.0000 mg | ORAL_TABLET | ORAL | Status: DC | PRN
  Administered 2021-09-17: 650 mg via ORAL
  Filled 2021-09-15: qty 2

## 2021-09-15 MED ORDER — BISACODYL 10 MG RE SUPP: 10.0000 mg | Freq: Every day | RECTAL | Status: DC | PRN

## 2021-09-15 MED ORDER — BUPIVACAINE LIPOSOME 1.3 % IJ SUSP
INTRAMUSCULAR | Status: AC
  Filled 2021-09-15: qty 20

## 2021-09-15 MED ORDER — MORPHINE SULFATE (PF) 4 MG/ML IV SOLN: 4.0000 mg | INTRAVENOUS | Status: DC | PRN

## 2021-09-15 MED ORDER — LATANOPROST 0.005 % OP SOLN
1.0000 [drp] | Freq: Every day | OPHTHALMIC | Status: DC
  Administered 2021-09-15 – 2021-09-16 (×2): 1 [drp] via OPHTHALMIC
  Filled 2021-09-15: qty 2.5

## 2021-09-15 MED ORDER — BACITRACIN ZINC 500 UNIT/GM EX OINT
TOPICAL_OINTMENT | CUTANEOUS | Status: DC | PRN
  Administered 2021-09-15: 1 via TOPICAL

## 2021-09-15 MED ORDER — FENTANYL CITRATE (PF) 250 MCG/5ML IJ SOLN
INTRAMUSCULAR | Status: DC | PRN
  Administered 2021-09-15 (×5): 50 ug via INTRAVENOUS

## 2021-09-15 MED ORDER — TIMOLOL MALEATE 0.25 % OP SOLN
1.0000 [drp] | Freq: Every day | OPHTHALMIC | Status: DC
  Administered 2021-09-16 – 2021-09-17 (×2): 1 [drp] via OPHTHALMIC
  Filled 2021-09-15: qty 5

## 2021-09-15 MED ORDER — SUGAMMADEX SODIUM 200 MG/2ML IV SOLN
INTRAVENOUS | Status: DC | PRN
  Administered 2021-09-15: 200 mg via INTRAVENOUS

## 2021-09-15 SURGICAL SUPPLY — 59 items
APL SKNCLS STERI-STRIP NONHPOA (GAUZE/BANDAGES/DRESSINGS) ×1
BAG COUNTER SPONGE SURGICOUNT (BAG) ×2 IMPLANT
BAG SPNG CNTER NS LX DISP (BAG) ×1
BASKET BONE COLLECTION (BASKET) ×2 IMPLANT
BENZOIN TINCTURE PRP APPL 2/3 (GAUZE/BANDAGES/DRESSINGS) ×2 IMPLANT
BUR MATCHSTICK NEURO 3.0 LAGG (BURR) ×2 IMPLANT
BUR PRECISION FLUTE 6.0 (BURR) ×2 IMPLANT
CAGE ALTERA 10X31X10-14 15D (Cage) ×1 IMPLANT
CANISTER SUCT 3000ML PPV (MISCELLANEOUS) ×2 IMPLANT
CAP LOCK DLX THRD (Cap) ×4 IMPLANT
CARTRIDGE OIL MAESTRO DRILL (MISCELLANEOUS) ×1 IMPLANT
CNTNR URN SCR LID CUP LEK RST (MISCELLANEOUS) ×1 IMPLANT
CONT SPEC 4OZ STRL OR WHT (MISCELLANEOUS) ×2
COVER BACK TABLE 60X90IN (DRAPES) ×2 IMPLANT
DIFFUSER DRILL AIR PNEUMATIC (MISCELLANEOUS) ×2 IMPLANT
DRAPE C-ARM 42X72 X-RAY (DRAPES) ×4 IMPLANT
DRAPE HALF SHEET 40X57 (DRAPES) ×2 IMPLANT
DRAPE LAPAROTOMY 100X72X124 (DRAPES) ×2 IMPLANT
DRAPE SURG 17X23 STRL (DRAPES) ×8 IMPLANT
DRSG OPSITE POSTOP 4X6 (GAUZE/BANDAGES/DRESSINGS) ×2 IMPLANT
ELECT BLADE 4.0 EZ CLEAN MEGAD (MISCELLANEOUS) ×2
ELECT REM PT RETURN 9FT ADLT (ELECTROSURGICAL) ×2
ELECTRODE BLDE 4.0 EZ CLN MEGD (MISCELLANEOUS) ×1 IMPLANT
ELECTRODE REM PT RTRN 9FT ADLT (ELECTROSURGICAL) ×1 IMPLANT
EVACUATOR 1/8 PVC DRAIN (DRAIN) IMPLANT
GAUZE 4X4 16PLY ~~LOC~~+RFID DBL (SPONGE) ×2 IMPLANT
GLOVE SURG ENC MOIS LTX SZ8 (GLOVE) ×4 IMPLANT
GLOVE SURG ENC MOIS LTX SZ8.5 (GLOVE) ×4 IMPLANT
GOWN STRL REUS W/ TWL LRG LVL3 (GOWN DISPOSABLE) IMPLANT
GOWN STRL REUS W/ TWL XL LVL3 (GOWN DISPOSABLE) ×2 IMPLANT
GOWN STRL REUS W/TWL 2XL LVL3 (GOWN DISPOSABLE) IMPLANT
GOWN STRL REUS W/TWL LRG LVL3 (GOWN DISPOSABLE) ×4
GOWN STRL REUS W/TWL XL LVL3 (GOWN DISPOSABLE) ×4
HEMOSTAT POWDER KIT SURGIFOAM (HEMOSTASIS) ×3 IMPLANT
KIT BASIN OR (CUSTOM PROCEDURE TRAY) ×2 IMPLANT
KIT GRAFTMAG DEL NEURO DISP (NEUROSURGERY SUPPLIES) ×1 IMPLANT
KIT TURNOVER KIT B (KITS) ×2 IMPLANT
MILL MEDIUM DISP (BLADE) ×2 IMPLANT
NDL HYPO 21X1.5 SAFETY (NEEDLE) IMPLANT
NEEDLE HYPO 21X1.5 SAFETY (NEEDLE) ×2 IMPLANT
NEEDLE HYPO 22GX1.5 SAFETY (NEEDLE) ×2 IMPLANT
NS IRRIG 1000ML POUR BTL (IV SOLUTION) ×2 IMPLANT
OIL CARTRIDGE MAESTRO DRILL (MISCELLANEOUS) ×2
PACK LAMINECTOMY NEURO (CUSTOM PROCEDURE TRAY) ×2 IMPLANT
PAD ARMBOARD 7.5X6 YLW CONV (MISCELLANEOUS) ×6 IMPLANT
PUTTY DBM 10CC CALC GRAN (Putty) ×1 IMPLANT
ROD CURVED TI 6.35X45 (Rod) ×2 IMPLANT
SCREW PA DLX CREO 7.5X50 (Screw) ×2 IMPLANT
SCREW PA DLX CREO 7.5X55 (Screw) ×2 IMPLANT
SPONGE T-LAP 4X18 ~~LOC~~+RFID (SPONGE) ×2 IMPLANT
STRIP CLOSURE SKIN 1/2X4 (GAUZE/BANDAGES/DRESSINGS) ×2 IMPLANT
SUT VIC AB 1 CT1 18XBRD ANBCTR (SUTURE) ×2 IMPLANT
SUT VIC AB 1 CT1 8-18 (SUTURE) ×4
SUT VIC AB 2-0 CP2 18 (SUTURE) ×4 IMPLANT
SYR 20ML LL LF (SYRINGE) ×1 IMPLANT
TOWEL GREEN STERILE (TOWEL DISPOSABLE) ×2 IMPLANT
TOWEL GREEN STERILE FF (TOWEL DISPOSABLE) ×2 IMPLANT
TRAY FOLEY MTR SLVR 16FR STAT (SET/KITS/TRAYS/PACK) ×2 IMPLANT
WATER STERILE IRR 1000ML POUR (IV SOLUTION) ×2 IMPLANT

## 2021-09-15 NOTE — Progress Notes (Signed)
Orthopedic Tech Progress Note Patient Details:  Terri Mccall 04-24-1934 [id number] LSO Brace was delivered to patient in PACU. Ortho Devices Type of Ortho Device: Lumbar corsett Ortho Device/Splint Location: Back Ortho Device/Splint Interventions: Ordered      Rorie Delmore E Johnatan Baskette 09/15/2021, 7:09 PM

## 2021-09-15 NOTE — Transfer of Care (Signed)
Immediate Anesthesia Transfer of Care Note  Patient: Terri Mccall  Procedure(s) Performed: LUMBAR FOUR-FIVE POSTERIOR LUMBAR INTERBODY FUSION, LEFT LUMBAR THREE-FOUR LAMINECTOMY (Spine Lumbar)  Patient Location: PACU  Anesthesia Type:General  Level of Consciousness: awake, alert  and oriented  Airway & Oxygen Therapy: Patient Spontanous Breathing and Patient connected to nasal cannula oxygen  Post-op Assessment: Report given to RN and Post -op Vital signs reviewed and stable  Post vital signs: Reviewed and stable  Last Vitals:  Vitals Value Taken Time  BP 152/62 09/15/21 1802  Temp    Pulse 75 09/15/21 1803  Resp 24 09/15/21 1803  SpO2 99 % 09/15/21 1803  Vitals shown include unvalidated device data.  Last Pain:  Vitals:   09/15/21 1136  TempSrc:   PainSc: 3       Patients Stated Pain Goal: 3 (35/00/93 8182)  Complications: No notable events documented.

## 2021-09-15 NOTE — Op Note (Signed)
Brief history: The patient is an 87 year old white female who complains of back and bilateral leg pain consistent with neurogenic claudication.  She has failed medical management and was worked up with lumbar x-rays and lumbar lumbar MRI which demonstrated lumbar spine listhesis and spinal stenosis.  I discussed the various treatment options with her.  She decided to proceed with surgery.  Preoperative diagnosis: L4-5 spondylolisthesis , L3-4 and L4-5 spinal stenosis compressing both the L4 and the L5 nerve roots; lumbago; lumbar radiculopathy; neurogenic claudication  Postoperative diagnosis: The same  Procedure: Bilateral L4-5 and left L3-4 laminotomy/foraminotomies/medial facetectomy to decompress the bilateral L4 and L5 nerve roots(the work required to do this was in addition to the work required to do the posterior lumbar interbody fusion because of the patient's spinal stenosis, facet arthropathy. Etc. requiring a wide decompression of the nerve roots.);  L4-5 transforaminal lumbar interbody fusion with local morselized autograft bone and Zimmer DBM; insertion of interbody prosthesis at L4-5 (globus peek expandable interbody prosthesis); posterior nonsegmental instrumentation from L4 to L5 with globus titanium pedicle screws and rods; posterior lateral arthrodesis at L4-5 with local morselized autograft bone and Zimmer DBM.  Surgeon: Dr. Earle Gell  Asst.: Arnetha Massy, NP  Anesthesia: Gen. endotracheal  Estimated blood loss: 200 cc  Drains: None  Complications: None  Description of procedure: The patient was brought to the operating room by the anesthesia team. General endotracheal anesthesia was induced. The patient was turned to the prone position on the Wilson frame. The patient's lumbosacral region was then prepared with Betadine scrub and Betadine solution. Sterile drapes were applied.  I then injected the area to be incised with Marcaine with epinephrine solution. I then used  the scalpel to make a linear midline incision over the L3-4 and L4-5 interspace. I then used electrocautery to perform a bilateral subperiosteal dissection exposing the spinous process and lamina of L3, L4 and L5. We then obtained intraoperative radiograph to confirm our location. We then inserted the Verstrac retractor to provide exposure.  I began the decompression by using the high speed drill to perform laminotomies at L4-5 bilaterally and L3-4 on the left. We then used the Kerrison punches to widen the laminotomy and removed the ligamentum flavum at L4-5 bilaterally and L3-4 and the left. We used the Kerrison punches to remove the medial facets at L4-5 bilaterally. We performed wide foraminotomies about the bilateral L4 and L5 nerve roots completing the decompression.  We now turned our attention to the posterior lumbar interbody fusion. I used a scalpel to incise the intervertebral disc at L4-5 bilaterally. I then performed a partial intervertebral discectomy at L4-5 bilaterally using the pituitary forceps. We prepared the vertebral endplates at Z3-0 bilaterally for the fusion by removing the soft tissues with the curettes. We then used the trial spacers to pick the appropriate sized interbody prosthesis. We prefilled his prosthesis with a combination of local morselized autograft bone that we obtained during the decompression as well as Zimmer DBM. We inserted the prefilled prosthesis into the interspace at L4-5 from the left, we then turned and expanded the prosthesis. There was a good snug fit of the prosthesis in the interspace. We then filled and the remainder of the intervertebral disc space with local morselized autograft bone and Zimmer DBM. This completed the posterior lumbar interbody arthrodesis.  During the decompression and insertion of the prosthesis the assistant protected the thecal sac and nerve roots with the D'Errico retractor.  We now turned attention to the instrumentation. Under  fluoroscopic guidance we cannulated the bilateral L4 and L5 pedicles with the bone probe. We then removed the bone probe. We then tapped the pedicle with a 6.5 millimeter tap. We then removed the tap. We probed inside the tapped pedicle with a ball probe to rule out cortical breaches. We then inserted a 7.5 x 50 and 55 millimeter pedicle screw into the L4 and L5 pedicles bilaterally under fluoroscopic guidance. We then palpated along the medial aspect of the pedicles to rule out cortical breaches. There were none. The nerve roots were not injured. We then connected the unilateral pedicle screws with a lordotic rod. We compressed the construct and secured the rod in place with the caps. We then tightened the caps appropriately. This completed the instrumentation from L4-5.  We now turned our attention to the posterior lateral arthrodesis at L4-5. We used the high-speed drill to decorticate the remainder of the facets, pars, transverse process at L4-5. We then applied a combination of local morselized autograft bone and Zimmer DBM over these decorticated posterior lateral structures. This completed the posterior lateral arthrodesis.  We then obtained hemostasis using bipolar electrocautery. We irrigated the wound out with bacitracin solution. We inspected the thecal sac and nerve roots and noted they were well decompressed. We then removed the retractor.  We injected Exparel . We reapproximated patient's thoracolumbar fascia with interrupted #1 Vicryl suture. We reapproximated patient's subcutaneous tissue with interrupted 2-0 Vicryl suture. The reapproximated patient's skin with Steri-Strips and benzoin. The wound was then coated with bacitracin ointment. A sterile dressing was applied. The drapes were removed. The patient was subsequently returned to the supine position where they were extubated by the anesthesia team. He was then transported to the post anesthesia care unit in stable condition. All sponge  instrument and needle counts were reportedly correct at the end of this case.

## 2021-09-15 NOTE — Anesthesia Procedure Notes (Signed)
Procedure Name: Intubation Date/Time: 09/15/2021 2:49 PM Performed by: Janace Litten, CRNA Pre-anesthesia Checklist: Patient identified, Emergency Drugs available, Suction available and Patient being monitored Patient Re-evaluated:Patient Re-evaluated prior to induction Oxygen Delivery Method: Circle System Utilized Preoxygenation: Pre-oxygenation with 100% oxygen Induction Type: IV induction Ventilation: Mask ventilation without difficulty Laryngoscope Size: Mac and 3 Grade View: Grade I Tube type: Oral Tube size: 7.0 mm Number of attempts: 1 Airway Equipment and Method: Stylet and Oral airway Placement Confirmation: ETT inserted through vocal cords under direct vision, positive ETCO2 and breath sounds checked- equal and bilateral Secured at: 21 cm Tube secured with: Tape Dental Injury: Teeth and Oropharynx as per pre-operative assessment

## 2021-09-15 NOTE — H&P (Signed)
Subjective: The patient is an 87 year old white female who is complained of back and left great and right leg pain consistent with neurogenic claudication.  She has failed medical management.  She was worked up with a lumbar MRI and lumbar x-rays which demonstrated a lumbar spondylolisthesis and spinal stenosis.  I discussed the various treatment options with the patient.  She has decided proceed with surgery.  Past Medical History:  Diagnosis Date   Arthritis    Carpal tunnel syndrome of left wrist    Constipation    GERD (gastroesophageal reflux disease)    Hyperlipidemia    Hypertension    Pre-diabetes     Past Surgical History:  Procedure Laterality Date   ABDOMINAL HYSTERECTOMY     APPENDECTOMY     BACK SURGERY  1998   CARPAL TUNNEL RELEASE Left 03/06/2018   Procedure: LEFT CARPAL TUNNEL RELEASE;  Surgeon: Daryll Brod, MD;  Location: [facility];  Service: Orthopedics;  Laterality: Left;   CHOLECYSTECTOMY N/A 09/27/2016   Procedure: LAPAROSCOPIC CHOLECYSTECTOMY;  Surgeon: Vickie Epley, MD;  Location: [facility];  Service: General;  Laterality: N/A;   COLONOSCOPY  04/26/2012   Procedure: COLONOSCOPY;  Surgeon: Rogene Houston, MD;  Location: [facility];  Service: Endoscopy;  Laterality: N/A;  [place]  11/20/2012   Procedure: RELEASE TRIGGER FINGER/A-1 PULLEY;  Surgeon: Wynonia Sours, MD;  Location: [facility];  Service: Orthopedics;  Laterality: Right;  RELEASE A-1 PULLEY RIGHT INDEX FINGER    Allergies  Allergen Reactions   Amlodipine Other (See Comments)    Flush/ not really sure if this is the correct medication   Bextra [Valdecoxib] Hives   Morphine And Related     Changed personality    Social History   Tobacco Use   Smoking status: Never   Smokeless tobacco: Never  Substance Use Topics   Alcohol use: Yes    Comment: rarely    History reviewed. No pertinent family history. Prior to Admission  medications   Medication Sig Start Date End Date Taking? Authorizing Provider  aspirin EC 81 MG tablet Take 81 mg by mouth every evening.   Yes [provider]  atenolol (TENORMIN) 12.5 mg TABS tablet Take 12.5 tablets by mouth 2 (two) times daily. 04/02/19  Yes [provider]  beta carotene w/minerals (OCUVITE) tablet Take 1 tablet by mouth every morning.   Yes [provider]  Calcium Carbonate-Vit D-Min (CALCIUM 600+D PLUS MINERALS PO) Take 1 tablet by mouth daily.   Yes [provider]  cetirizine (ZYRTEC) 10 MG tablet Take 10 mg by mouth daily.   Yes [provider]  chlorthalidone (HYGROTON) 25 MG tablet Take 25 mg by mouth daily.   Yes [provider]  Cinnamon 500 MG capsule Take 500 mg by mouth daily.   Yes [provider]  CRANBERRY PO Take 300 mg by mouth daily.   Yes [provider]  famotidine (PEPCID) 20 MG tablet Take 1 tablet (20 mg total) by mouth 2 (two) times daily. Patient taking differently: Take 10 mg by mouth at bedtime. 06/10/20  Yes Tanda Rockers, MD  fluticasone (FLONASE) 50 MCG/ACT nasal spray Place 2 sprays into both nostrils daily.   Yes [provider]  glucosamine-chondroitin 500-400 MG tablet Take 2 tablets by mouth daily.    Yes [provider]  hydrocortisone (ANUSOL-HC) 2.5 % rectal cream Place 1 application rectally daily  as needed for hemorrhoids. 06/28/21  Yes [provider]  latanoprost (XALATAN) 0.005 % ophthalmic solution Place 1 drop into both eyes at bedtime. 07/16/13  Yes [provider]  LORazepam (ATIVAN) 0.5 MG tablet Take 0.25 mg by mouth daily as needed for anxiety. 06/05/18  Yes [provider]  olmesartan (BENICAR) 40 MG tablet Take 40 mg by mouth every morning.   Yes [provider]  pantoprazole (PROTONIX) 40 MG tablet TAKE 1 TABLET BY MOUTH ONCE DAILY 30 TO 60 MINUTES BEFORE FIRST MEAL OF THE DAY. 09/13/21  Yes Tanda Rockers, MD  Polyethyl Glycol-Propyl Glycol (SYSTANE) 0.4-0.3 % GEL ophthalmic gel Place 1 application into both eyes at bedtime.   Yes [provider]  Polyethyl Glycol-Propyl Glycol (SYSTANE) 0.4-0.3 % SOLN Place 1 drop into both eyes daily as needed (Dry eye).   Yes [provider]  potassium chloride SA (K-DUR,KLOR-CON) 20 MEQ tablet Take 20 mEq by mouth every morning.   Yes [provider]  PREMARIN vaginal cream Place 1 Applicatorful vaginally once a week. 06/24/13  Yes [provider]  simvastatin (ZOCOR) 20 MG tablet Take 20 mg by mouth every evening.   Yes [provider]  timolol (BETIMOL) 0.25 % ophthalmic solution Place 1 drop into the right eye daily.   Yes [provider]     Review of Systems  Positive ROS: As above  All other systems have been reviewed and were otherwise negative with the exception of those mentioned in the HPI and as above.  Objective: Vital signs in last 24 hours: Temp:  [97.5 F (36.4 C)] 97.5 F (36.4 C) (11/09 1117) Pulse Rate:  [73] 73 (11/09 1117) Resp:  [20] 20 (11/09 1117) BP: (182)/(62) 182/62 (11/09 1117) SpO2:  [98 %] 98 % (11/09 1117) Weight:  [62.1 kg] 62.1 kg (11/09 1117) Estimated body mass index is 26.76 kg/m as calculated from the following:   Height as of this encounter: 5' (1.524 m).   Weight as of this encounter: 62.1 kg.   General Appearance: Alert Head: Normocephalic, without obvious abnormality, atraumatic Eyes: PERRL, conjunctiva/corneas clear, EOM's intact,    Ears: Normal  Throat: Normal  Neck: Supple, Back: unremarkable Lungs: Clear to auscultation bilaterally, respirations unlabored Heart: Regular rate and rhythm, no murmur, rub or gallop Abdomen: Soft, non-tender Extremities: Extremities normal, atraumatic, no cyanosis or edema Skin: unremarkable  NEUROLOGIC:   Mental status: alert and oriented,Motor Exam - grossly normal Sensory Exam - grossly normal Reflexes:   Coordination - grossly normal Gait - grossly normal Balance - grossly normal Cranial Nerves: I: smell Not tested  II: visual acuity  OS: Normal  OD: Normal   II: visual fields Full to confrontation  II: pupils Equal, round, reactive to light  III,VII: ptosis None  III,IV,VI: extraocular muscles  Full ROM  V: mastication Normal  V: facial light touch sensation  Normal  V,VII: corneal reflex  Present  VII: facial muscle function - upper  Normal  VII: facial muscle function - lower Normal  VIII: hearing Not tested  IX: soft palate elevation  Normal  IX,X: gag reflex Present  XI: trapezius strength  5/5  XI: sternocleidomastoid strength 5/5  XI: neck flexion strength  5/5  XII: tongue strength  Normal    Data Review Lab Results  Component Value Date   WBC 6.8 09/13/2021   HGB 13.1 09/13/2021   HCT 40.2 09/13/2021   MCV 88.7 09/13/2021   PLT 215 09/13/2021  Lab Results  Component Value Date   NA 129 (L) 09/13/2021   K 3.8 09/13/2021   CL 94 (L) 09/13/2021   CO2 25 09/13/2021   BUN 10 09/13/2021   CREATININE 0.60 09/13/2021   GLUCOSE 102 (H) 09/13/2021   Lab Results  Component Value Date   INR 0.99 09/27/2016    Assessment/Plan: Lumbar spinal stasis, lumbar spinal stenosis, lumbar radiculopathy, lumbago: I have discussed situation with the patient.  I reviewed her imaging studies with her and pointed out the abnormalities.  We have discussed the various treatment options including surgery.  I described the surgical treatment option of a lumbar decompression, instrumentation and fusion.  I have shown her surgical models.  I have given her a surgical pamphlet..  We have discussed the risk, benefits, alternatives, expected postop course, and the likelihood of achieving our goals with surgery.  I have answered all her questions.  She has decided proceed with surgery.   Ophelia Charter 09/15/2021 2:27 PM

## 2021-09-15 NOTE — Anesthesia Postprocedure Evaluation (Signed)
Anesthesia Post Note  Patient: Terri Mccall  Procedure(s) Performed: LUMBAR FOUR-FIVE POSTERIOR LUMBAR INTERBODY FUSION, LEFT LUMBAR THREE-FOUR LAMINECTOMY (Spine Lumbar)     Patient location during evaluation: PACU Anesthesia Type: General Level of consciousness: awake and alert and oriented Pain management: pain level controlled Vital Signs Assessment: post-procedure vital signs reviewed and stable Respiratory status: spontaneous breathing, nonlabored ventilation and respiratory function stable Cardiovascular status: blood pressure returned to baseline and stable Postop Assessment: no apparent nausea or vomiting Anesthetic complications: no   No notable events documented.  Last Vitals:  Vitals:   09/15/21 1932 09/15/21 1940  BP: (!) 147/55   Pulse: 79 81  Resp: 16 (!) 27  Temp:    SpO2: 99% 100%    Last Pain:  Vitals:   09/15/21 1932  TempSrc:   PainSc: Asleep                 Tandi Hanko A.

## 2021-09-16 DIAGNOSIS — M4316 Spondylolisthesis, lumbar region: Secondary | ICD-10-CM

## 2021-09-16 LAB — BASIC METABOLIC PANEL
Anion gap: 10 (ref 5–15)
BUN: 9 mg/dL (ref 8–23)
CO2: 23 mmol/L (ref 22–32)
Calcium: 8.6 mg/dL — ABNORMAL LOW (ref 8.9–10.3)
Chloride: 92 mmol/L — ABNORMAL LOW (ref 98–111)
Creatinine, Ser: 0.61 mg/dL (ref 0.44–1.00)
GFR, Estimated: >60 mL/min (ref >60)
Glucose, Bld: 180 mg/dL — ABNORMAL HIGH (ref 70–99)
Potassium: 2.9 mmol/L — ABNORMAL LOW (ref 3.5–5.1)
Sodium: 125 mmol/L — ABNORMAL LOW (ref 135–145)

## 2021-09-16 LAB — CBC
HCT: 30.5 % — ABNORMAL LOW (ref 36.0–46.0)
Hemoglobin: 10.4 g/dL — ABNORMAL LOW (ref 12.0–15.0)
MCH: 29.1 pg (ref 26.0–34.0)
MCHC: 34.1 g/dL (ref 30.0–36.0)
MCV: 85.2 fL (ref 80.0–100.0)
Platelets: 171 10*3/uL (ref 150–400)
RBC: 3.58 MIL/uL — ABNORMAL LOW (ref 3.87–5.11)
RDW: 13.5 % (ref 11.5–15.5)
WBC: 12.1 10*3/uL — ABNORMAL HIGH (ref 4.0–10.5)
nRBC: 0 % (ref 0.0–0.2)

## 2021-09-16 LAB — GLUCOSE, CAPILLARY: Glucose-Capillary: 139 mg/dL — ABNORMAL HIGH (ref 70–99)

## 2021-09-16 LAB — SODIUM, URINE, RANDOM: Sodium, Ur: 13 mmol/L

## 2021-09-16 LAB — OSMOLALITY: Osmolality: 265 mOsm/kg — ABNORMAL LOW (ref 275–295)

## 2021-09-16 LAB — OSMOLALITY, URINE: Osmolality, Ur: 222 mOsm/kg — ABNORMAL LOW (ref 300–900)

## 2021-09-16 MED ORDER — INSULIN ASPART 100 UNIT/ML IJ SOLN
0.0000 [IU] | Freq: Three times a day (TID) | INTRAMUSCULAR | Status: DC
  Administered 2021-09-17 (×2): 1 [IU] via SUBCUTANEOUS

## 2021-09-16 MED ORDER — POTASSIUM CHLORIDE CRYS ER 20 MEQ PO TBCR
40.0000 meq | EXTENDED_RELEASE_TABLET | Freq: Once | ORAL | Status: AC
  Administered 2021-09-16: 40 meq via ORAL
  Filled 2021-09-16: qty 2

## 2021-09-16 NOTE — Evaluation (Signed)
Physical Therapy Evaluation Patient Details Name: Terri Mccall MRN: [id number] DOB: 04-Aug-1934 Today's Date: 09/16/2021  History of Present Illness  87 yo female s/p L4-5 PLIF, L3-4 laminectomy on 11/9. PMH includes OA, carpal tunnel with release, GERD, HTN, HLD, preDM.   Clinical Impression  Pt presents with generalized weakness, post-operative back pain, decreased knowledge and application of precautions, min difficulty performing mobility tasks, and decreased activity tolerance vs baseline. Pt to benefit from acute PT to address deficits. Pt ambulated short hallway distance with use of RW and close guard for safety, pt limited by back pain, fatigue, and anxiety this session. Pt would benefit from another therapy session prior to d/c, MD aware. PT to progress mobility as tolerated, and will continue to follow acutely.         Recommendations for follow up therapy are one component of a multi-disciplinary discharge planning process, led by the attending physician.  Recommendations may be updated based on patient status, additional functional criteria and insurance authorization.  Follow Up Recommendations Follow physician's recommendations for discharge plan and follow up therapies    Assistance Recommended at Discharge Intermittent Supervision/Assistance  Functional Status Assessment Patient has had a recent decline in their functional status and demonstrates the ability to make significant improvements in function in a reasonable and predictable amount of time.  Equipment Recommendations  Rolling walker (2 wheels)    Recommendations for Other Services       Precautions / Restrictions Precautions Precautions: Fall;Back Precaution Booklet Issued: Yes (comment) Required Braces or Orthoses: Spinal Brace Spinal Brace: Applied in sitting position Restrictions Weight Bearing Restrictions: No      Mobility  Bed Mobility Overal bed mobility: Needs Assistance Bed Mobility:  Rolling;Sidelying to Sit Rolling: Min assist Sidelying to sit: Min assist       General bed mobility comments: assist for trunk rise and LE lowering over EOB. Cues for sequencing.    Transfers Overall transfer level: Needs assistance Equipment used: Rolling walker (2 wheels) Transfers: Sit to/from Stand Sit to Stand: Min assist           General transfer comment: min assist for power up, rise, steadying. Cues for hand placement when rising/sitting. STS x3 during session.    Ambulation/Gait Ambulation/Gait assistance: Min guard Gait Distance (Feet): 45 Feet Assistive device: Rolling walker (2 wheels) Gait Pattern/deviations: Step-through pattern;Decreased stride length;Trunk flexed Gait velocity: decr     General Gait Details: for safety, cues for upright posture and positioning in RW.  Stairs            Wheelchair Mobility    Modified Rankin (Stroke Patients Only)       Balance Overall balance assessment: Needs assistance Sitting-balance support: Feet supported;No upper extremity supported Sitting balance-Leahy Scale: Fair     Standing balance support: Bilateral upper extremity supported;During functional activity Standing balance-Leahy Scale: Poor                               Pertinent Vitals/Pain Pain Assessment: 0-10 Pain Score: 7  Pain Location: back Pain Descriptors / Indicators: Sore Pain Intervention(s): Limited activity within patient's tolerance;Monitored during session;Repositioned    Home Living Family/patient expects to be discharged to:: Private residence Living Arrangements: Spouse/significant other Available Help at Discharge: Family;Available 24 hours/day (spouse, daughter coming from [place] to stay) Type of Home: House Home Access: Stairs to enter Entrance Stairs-Rails: Left Entrance Stairs-Number of Steps: 2   Home Layout: Two level;Able to  live on main level with bedroom/bathroom Home Equipment: Grab  bars - tub/shower;Cane - single point      Prior Function Prior Level of Function : Needs assist       Physical Assist : Mobility (physical);ADLs (physical)     Mobility Comments: pt intermittently using cane ADLs Comments: pt has a house cleaner who could also clean, available every 2 weeks     Hand Dominance   Dominant Hand: Right    Extremity/Trunk Assessment   Upper Extremity Assessment Upper Extremity Assessment: Defer to OT evaluation    Lower Extremity Assessment Lower Extremity Assessment: Generalized weakness    Cervical / Trunk Assessment Cervical / Trunk Assessment: Back Surgery  Communication   Communication: No difficulties  Cognition Arousal/Alertness: Awake/alert Behavior During Therapy: Anxious Overall Cognitive Status: Within Functional Limits for tasks assessed                                          General Comments      Exercises     Assessment/Plan    PT Assessment Patient needs continued PT services  PT Problem List Decreased strength;Decreased mobility;Decreased activity tolerance;Decreased balance;Decreased knowledge of use of DME;Pain;Decreased knowledge of precautions;Decreased safety awareness       PT Treatment Interventions DME instruction;Gait training;Therapeutic exercise;Therapeutic activities;Patient/family education;Balance training;Stair training;Functional mobility training;Neuromuscular re-education    PT Goals (Current goals can be found in the Care Plan section)  Acute Rehab PT Goals Patient Stated Goal: home PT Goal Formulation: With patient Time For Goal Achievement: 09/30/21 Potential to Achieve Goals: Good    Frequency Min 5X/week   Barriers to discharge        Co-evaluation               AM-PAC PT "6 Clicks" Mobility  Outcome Measure Help needed turning from your back to your side while in a flat bed without using bedrails?: A Little Help needed moving from lying on your back to  sitting on the side of a flat bed without using bedrails?: A Little Help needed moving to and from a bed to a chair (including a wheelchair)?: A Little Help needed standing up from a chair using your arms (e.g., wheelchair or bedside chair)?: A Little Help needed to walk in hospital room?: A Little Help needed climbing 3-5 steps with a railing? : A Little 6 Click Score: 18    End of Session Equipment Utilized During Treatment: Back brace Activity Tolerance: Patient tolerated treatment well;Patient limited by fatigue;Patient limited by pain Patient left: in chair;with call bell/phone within reach;with chair alarm set;with family/visitor present Nurse Communication: Mobility status PT Visit Diagnosis: Other abnormalities of gait and mobility (R26.89);History of falling (Z91.81)    Time: 0998-3382 PT Time Calculation (min) (ACUTE ONLY): 41 min   Charges:   PT Evaluation $PT Eval Low Complexity: 1 Low PT Treatments $Gait Training: 8-22 mins $Therapeutic Activity: 8-22 mins       Stacie Glaze, PT DPT Acute Rehabilitation Services Pager [phone]  Office [phone]   [place] 09/16/2021, 10:42 AM

## 2021-09-16 NOTE — Progress Notes (Addendum)
Subjective: The patient is alert and pleasant.  Her back is appropriately sore.  Her husband is at the bedside.  Objective: Vital signs in last 24 hours: Temp:  [97.4 F (36.3 C)-98.6 F (37 C)] 98.4 F (36.9 C) (11/10 0801) Pulse Rate:  [73-92] 87 (11/10 0801) Resp:  [6-27] 17 (11/10 0801) BP: (114-182)/(50-96) 135/62 (11/10 0801) SpO2:  [92 %-100 %] 92 % (11/10 0801) Weight:  [62.1 kg-66.2 kg] 66.2 kg (11/09 2037) Estimated body mass index is 28.5 kg/m as calculated from the following:   Height as of this encounter: 5' (1.524 m).   Weight as of this encounter: 66.2 kg.   Intake/Output from previous day: 11/09 0701 - 11/10 0700 In: 2234.8 [P.O.:480; I.V.:1504.8; IV Piggyback:200] Out: 1860 [ZOXWR:6045; Blood:200] Intake/Output this shift: No intake/output data recorded.  Physical exam patient is alert and pleasant.  She is in no apparent distress.  Her lower extremity strength is grossly normal.  Lab Results: Recent Labs    09/13/21 1200 09/16/21 0316  WBC 6.8 12.1*  HGB 13.1 10.4*  HCT 40.2 30.5*  PLT 215 171   BMET Recent Labs    09/13/21 1200 09/16/21 0316  NA 129* 125*  K 3.8 2.9*  CL 94* 92*  CO2 25 23  GLUCOSE 102* 180*  BUN 10 9  CREATININE 0.60 0.61  CALCIUM 9.8 8.6*    Studies/Results: DG Lumbar Spine 2-3 Views  Result Date: 09/15/2021 CLINICAL DATA:  Lumbar fusion. EXAM: LUMBAR SPINE - 2-3 VIEW COMPARISON:  None. FINDINGS: Two intraoperative fluoroscopic spot images provided. The total fluoroscopic time is 20 seconds with a total air Karma of 9.95 mGy. There is L4-L5 posterior fusion. IMPRESSION: Intraoperative fluoroscopic spot images of the lumbar spine. Electronically Signed   By: Anner Crete M.D.   On: 09/15/2021 19:33   DG Lumbar Spine 1 View  Result Date: 09/15/2021 CLINICAL DATA:  Localization film for L5-L4 posterior lumbar interbody fusion EXAM: LUMBAR SPINE - 1 VIEW COMPARISON:  X-ray lumbar spine 08/19/2021 FINDINGS:  Intraoperative lumbar surgery at the L4-L5 level. One low resolution intraoperative spot views of the lumbar spine was obtained. No fracture visible on the limited views. Multilevel degeneration spine. Surgical hardware is noted just posterior to the L4-L5 articular facets. Atherosclerotic plaque of the aorta. IMPRESSION: Surgical hardware just posterior to the L4-L5 articular facets. Electronically Signed   By: Iven Finn M.D.   On: 09/15/2021 16:16   DG C-Arm 1-60 Min-No Report  Result Date: 09/15/2021 Fluoroscopy was utilized by the requesting physician.  No radiographic interpretation.    Assessment/Plan: Postop day #1: The patient is doing well.  She does not look quite ready to go home.  We will continue to mobilize her with PT and OT.  Hyponatremia: This could be secondary to fluid overload during surgery.  I will Hep-Lock her IV.  I will asked the hospitalist to see the patient we will plan to repeat her BMP tomorrow.  LOS: 0 days     Terri Mccall 09/16/2021, 9:55 AM     Patient ID: Terri Mccall, female   DOB: [birth year], 87 y.o.   MRN: [id number]

## 2021-09-16 NOTE — Plan of Care (Signed)
  Problem: Health Behavior/Discharge Planning: Goal: Ability to manage health-related needs will improve Outcome: Progressing   Problem: Clinical Measurements: Goal: Respiratory complications will improve Outcome: Progressing   Problem: Clinical Measurements: Goal: Cardiovascular complication will be avoided Outcome: Progressing   Problem: Education: Goal: Knowledge of General Education information will improve Description: Including pain rating scale, medication(s)/side effects and non-pharmacologic comfort measures Outcome: Progressing   Problem: Clinical Measurements: Goal: Ability to maintain clinical measurements within normal limits will improve Outcome: Progressing   Problem: Nutrition: Goal: Adequate nutrition will be maintained Outcome: Progressing   Problem: Activity: Goal: Risk for activity intolerance will decrease Outcome: Progressing   Problem: Coping: Goal: Level of anxiety will decrease Outcome: Progressing   Problem: Elimination: Goal: Will not experience complications related to bowel motility Outcome: Progressing

## 2021-09-16 NOTE — Evaluation (Signed)
Occupational Therapy Evaluation Patient Details Name: Terri Mccall MRN: [id number] DOB: 03/03/1934 Today's Date: 09/16/2021   History of Present Illness 87 yo female s/p L4-5 PLIF, L3-4 laminectomy on 11/9. PMH includes OA, carpal tunnel with release, GERD, HTN, HLD, preDM.   Clinical Impression   Pt seen today with son at bedside, lives at home, will have family available at d/c for assistance. Pt uses SPC for mobility and is independent with ADLs at baseline. Pt requiring min guard-min A for transfers and bed mobility, educated pt on log rolling technique, back precautions, and compensatory LB dressing techniques. Pt verbalized and demonstrated understanding. Adjusted pt's brace for better fit, pt able to don/doff sitting EOB. Pt requiring increased time and required repetitive cuing for activity sequence during therapy session. Discussed shower chair recommendation with pt and son, pt states she prefers to purchase once home. Pt presenting with decreased balance, activity tolerance, pain, and ROM at this time, pt would benefit from another OT session to further address impairments prior to d/c, recommend safe d/c home with family assistance.     Recommendations for follow up therapy are one component of a multi-disciplinary discharge planning process, led by the attending physician.  Recommendations may be updated based on patient status, additional functional criteria and insurance authorization.   Follow Up Recommendations  No OT follow up    Assistance Recommended at Discharge Intermittent Supervision/Assistance  Functional Status Assessment  Patient has had a recent decline in their functional status and demonstrates the ability to make significant improvements in function in a reasonable and predictable amount of time.  Equipment Recommendations  Other (comment);Tub/shower seat (discussed shower seat with pt, pt prefers to purchase once home)    Recommendations for Other Services PT  consult     Precautions / Restrictions Precautions Precautions: Fall;Back Required Braces or Orthoses: Spinal Brace Spinal Brace: Applied in sitting position Restrictions Weight Bearing Restrictions: No      Mobility Bed Mobility Overal bed mobility: Needs Assistance Bed Mobility: Rolling;Sidelying to Sit;Sit to Sidelying Rolling: Min guard Sidelying to sit: Min guard     Sit to sidelying: Min guard General bed mobility comments: educated pt on log rolling technique to use practiced getting in/out of bed    Transfers Overall transfer level: Needs assistance Equipment used: Rolling walker (2 wheels) Transfers: Sit to/from Stand Sit to Stand: Min assist           General transfer comment: increased time to stand, mild unsteadiness at first, however able to take steps in place before walking forward. V/cing for hand placement on bed/walker      Balance Overall balance assessment: Needs assistance Sitting-balance support: Feet supported;No upper extremity supported Sitting balance-Leahy Scale: Good     Standing balance support: Bilateral upper extremity supported;During functional activity;Reliant on assistive device for balance Standing balance-Leahy Scale: Fair                             ADL either performed or assessed with clinical judgement   ADL Overall ADL's : Needs assistance/impaired Eating/Feeding: Set up;Sitting   Grooming: Standing;Set up Grooming Details (indicate cue type and reason): Washed hands standing at sink Upper Body Bathing: Supervision/ safety;Sitting   Lower Body Bathing: Minimal assistance;Sitting/lateral leans   Upper Body Dressing : Minimal assistance;Sitting;Cueing for compensatory techniques Upper Body Dressing Details (indicate cue type and reason): donned brace sitting EOB, required v/cing to pull straps and fit Lower Body Dressing: Minimal assistance;Sitting/lateral  leans Lower Body Dressing Details (indicate cue  type and reason): simulated LE dressing of donning socks, use of pivoting/compensatory strategies when sittin EOb Toilet Transfer: Min guard;Ambulation;Regular Toilet;Rolling walker (2 wheels) Toilet Transfer Details (indicate cue type and reason): ambulated to toilet, v/cing to use grab bars to sit/stand from commode Toileting- Clothing Manipulation and Hygiene: Min guard;Minimal assistance;Sit to/from stand Toileting - Clothing Manipulation Details (indicate cue type and reason): min A to pull up undergarments after toileting     Functional mobility during ADLs: Rolling walker (2 wheels);Min guard (pt takes small steps with RW) General ADL Comments: discussed shower chair recommendation with pt/son, pt prefers to purchase from retailer after d/c     Vision   Vision Assessment?: No apparent visual deficits     Perception     Praxis      Pertinent Vitals/Pain Pain Assessment: Faces Pain Score: 3  Faces Pain Scale: Hurts a little bit Pain Location: low back Pain Descriptors / Indicators: Sore Pain Intervention(s): Limited activity within patient's tolerance;Monitored during session;Repositioned     Hand Dominance Right   Extremity/Trunk Assessment Upper Extremity Assessment Upper Extremity Assessment: Overall WFL for tasks assessed   Lower Extremity Assessment Lower Extremity Assessment: Defer to PT evaluation   Cervical / Trunk Assessment Cervical / Trunk Assessment: Back Surgery   Communication Communication Communication: No difficulties   Cognition Arousal/Alertness: Awake/alert Behavior During Therapy: WFL for tasks assessed/performed Overall Cognitive Status: Within Functional Limits for tasks assessed                                 General Comments: required moderate repetitive cues for sequencing, reminders during therapy session, observed mild STM impairments     General Comments       Exercises     Shoulder Instructions      Home  Living Family/patient expects to be discharged to:: Private residence Living Arrangements: Spouse/significant other Available Help at Discharge: Family;Available 24 hours/day Type of Home: House Home Access: Stairs to enter [employer] of Steps: 2 Entrance Stairs-Rails: Left Home Layout: Two level;Able to live on main level with bedroom/bathroom     Bathroom Shower/Tub: Occupational psychologist: Standard     Home Equipment: Grab bars - tub/shower;Cane - single point   Additional Comments: uses cane when out in the community, occasionally at home when L foot becomes numb      Prior Functioning/Environment Prior Level of Function : Needs assist       Physical Assist : Mobility (physical);ADLs (physical)     Mobility Comments: pt intermittently using cane ADLs Comments: pt has a house cleaner who could also clean, available every 2 weeks, available to help get groceries, household chores        OT Problem List: Decreased range of motion;Decreased strength;Decreased activity tolerance;Impaired balance (sitting and/or standing);Decreased cognition;Decreased coordination;Pain      OT Treatment/Interventions: Self-care/ADL training;DME and/or AE instruction;Therapeutic activities    OT Goals(Current goals can be found in the care plan section) Acute Rehab OT Goals Patient Stated Goal: return home OT Goal Formulation: With patient/family Time For Goal Achievement: 09/30/21 Potential to Achieve Goals: Good ADL Goals Pt Will Perform Upper Body Bathing: with supervision;sitting Pt Will Perform Lower Body Bathing: with supervision;sitting/lateral leans Pt Will Perform Upper Body Dressing: with supervision;sitting Pt Will Perform Lower Body Dressing: with supervision;sit to/from stand Pt Will Perform Tub/Shower Transfer: with supervision;shower seat;rolling walker  OT Frequency:  Min 2X/week   Barriers to D/C:            Co-evaluation               AM-PAC OT "6 Clicks" Daily Activity     Outcome Measure Help from another person eating meals?: None Help from another person taking care of personal grooming?: None Help from another person toileting, which includes using toliet, bedpan, or urinal?: A Little Help from another person bathing (including washing, rinsing, drying)?: A Little Help from another person to put on and taking off regular upper body clothing?: A Little Help from another person to put on and taking off regular lower body clothing?: A Lot 6 Click Score: 19   End of Session Equipment Utilized During Treatment: Gait belt;Rolling walker (2 wheels) Nurse Communication: Mobility status;Other (comment) (notified nurse of pt's void during session)  Activity Tolerance: Patient tolerated treatment well Patient left: with bed alarm set;in bed;with family/visitor present (son at bedside)  OT Visit Diagnosis: Unsteadiness on feet (R26.81);Other abnormalities of gait and mobility (R26.89);Muscle weakness (generalized) (M62.81);Pain                Time: 1415-1510 OT Time Calculation (min): 55 min Charges:  OT General Charges $OT Visit: 1 Visit OT Evaluation $OT Eval Low Complexity: 1 Low OT Treatments $Self Care/Home Management : 38-52 mins  Lynnda Child, OTD, OTR/L Acute Rehab [phone]) [phone] - [place] 09/16/2021, 3:38 PM

## 2021-09-16 NOTE — Consult Note (Addendum)
Medical Consultation   KATALIA CHOMA  RXV:[id number]  DOB: [birth year]  DOA: 09/15/2021  PCP: Asencion Noble, MD   Outpatient Specialists: Arnoldo Morale - neurosurgery; [facility] - podiatry; Melvyn Novas - pulmonology    Requesting physician: Arnoldo Morale  Reason for consultation: Spinal fusion yesterday, now with hyponatremia.   History of Present Illness: Terri Mccall is an 87 y.o. female with h/o HTN, HLD, and prediabetes presenting with radicular symptoms from lumbar spinal stenosis.  She underwent laminectomy/foraminotomies/facetectomy with spinal fusion by Dr. Arnoldo Morale yesterday.  She reports minimal pain, controlled with Tylenol.  She has already been up and walked a bit.  She does take salt tabs daily and took hers today.  No confusion, dizziness, seizures.      Review of Systems:  ROS As per HPI otherwise review of systems negative.    Past Medical History: Past Medical History:  Diagnosis Date   Arthritis    Carpal tunnel syndrome of left wrist    Constipation    GERD (gastroesophageal reflux disease)    Hyperlipidemia    Hypertension    Pre-diabetes     Past Surgical History: Past Surgical History:  Procedure Laterality Date   ABDOMINAL HYSTERECTOMY     APPENDECTOMY     BACK SURGERY  1998   CARPAL TUNNEL RELEASE Left 03/06/2018   Procedure: LEFT CARPAL TUNNEL RELEASE;  Surgeon: Daryll Brod, MD;  Location: [facility];  Service: Orthopedics;  Laterality: Left;   CHOLECYSTECTOMY N/A 09/27/2016   Procedure: LAPAROSCOPIC CHOLECYSTECTOMY;  Surgeon: Vickie Epley, MD;  Location: [facility];  Service: General;  Laterality: N/A;   COLONOSCOPY  04/26/2012   Procedure: COLONOSCOPY;  Surgeon: Rogene Houston, MD;  Location: [facility];  Service: Endoscopy;  Laterality: N/A;  [place]  11/20/2012   Procedure: RELEASE TRIGGER FINGER/A-1 PULLEY;  Surgeon: Wynonia Sours, MD;  Location: [facility];  Service:  Orthopedics;  Laterality: Right;  RELEASE A-1 PULLEY RIGHT INDEX FINGER     Allergies:   Allergies  Allergen Reactions   Amlodipine Other (See Comments)    Flush/ not really sure if this is the correct medication   Bextra [Valdecoxib] Hives   Morphine And Related     Changed personality     Social History:  reports that she has never smoked. She has never used smokeless tobacco. She reports current alcohol use. She reports that she does not use drugs.   Family History: History reviewed. No pertinent family history.    Physical Exam: Vitals:   09/15/21 2037 09/16/21 0021 09/16/21 0401 09/16/21 0801  BP: (!) 171/60 (!) 139/56 (!) 125/50 135/62  Pulse: 87 81 78 87  Resp: 20 20 18 17   Temp: 98.6 F (37 C) (!) 97.4 F (36.3 C) 98.1 F (36.7 C) 98.4 F (36.9 C)  TempSrc: Oral Oral Oral Oral  SpO2: 97% 95% 95% 92%  Weight: 66.2 kg     Height:        Constitutional: Alert and awake, oriented x3, not in any acute distress. Eyes: EOMI, irises appear normal, anicteric sclera  ENMT: external ears and nose appear normal, normal hearing, Lips appear normal, moist oral mucosa Neck: neck appears normal, no masses, normal ROM CVS: S1-S2 clear, no murmur rubs or gallops, no LE edema, normal pedal pulses  Respiratory:  clear to auscultation bilaterally, no wheezing, rales  or rhonchi. Respiratory effort normal. No accessory muscle use.  Abdomen: soft nontender, nondistended Musculoskeletal: : no cyanosis, clubbing or edema noted bilaterally Neuro: Cranial nerves II-XII grossly intact Psych: judgement and insight appear normal, stable mood and affect, mental status Skin: no rashes or lesions or ulcers, no induration or nodules    Data reviewed:  I have personally reviewed the recent labs and imaging studies  Pertinent Labs:   Na++ 125; 129 on 11/7 - chronic in nature K+ 2.9 Glucose 180 WBC 12.1 Hgb 10.4 A1c 6.4 on 11/7 COVID negative on 11/7   Inpatient Medications:    Scheduled Meds:  acetaminophen  1,000 mg Oral Q6H   atenolol  12.5 mg Oral BID   chlorthalidone  25 mg Oral Daily   docusate sodium  100 mg Oral BID   famotidine  10 mg Oral QHS   fluticasone  2 spray Each Nare Daily   insulin aspart  0-9 Units Subcutaneous TID WC   irbesartan  300 mg Oral Daily   latanoprost  1 drop Both Eyes QHS   loratadine  10 mg Oral Daily   pantoprazole  20 mg Oral BID   polyvinyl alcohol  1 drop Both Eyes QHS   potassium chloride SA  20 mEq Oral q morning   potassium chloride  40 mEq Oral Once   simvastatin  20 mg Oral QPM   sodium chloride flush  3 mL Intravenous Q12H   timolol  1 drop Right Eye Daily   Continuous Infusions:  sodium chloride 250 mL (09/15/21 2215)     Radiological Exams on Admission: DG Lumbar Spine 2-3 Views  Result Date: 09/15/2021 CLINICAL DATA:  Lumbar fusion. EXAM: LUMBAR SPINE - 2-3 VIEW COMPARISON:  None. FINDINGS: Two intraoperative fluoroscopic spot images provided. The total fluoroscopic time is 20 seconds with a total air Karma of 9.95 mGy. There is L4-L5 posterior fusion. IMPRESSION: Intraoperative fluoroscopic spot images of the lumbar spine. Electronically Signed   By: Anner Crete M.D.   On: 09/15/2021 19:33   DG Lumbar Spine 1 View  Result Date: 09/15/2021 CLINICAL DATA:  Localization film for L5-L4 posterior lumbar interbody fusion EXAM: LUMBAR SPINE - 1 VIEW COMPARISON:  X-ray lumbar spine 08/19/2021 FINDINGS: Intraoperative lumbar surgery at the L4-L5 level. One low resolution intraoperative spot views of the lumbar spine was obtained. No fracture visible on the limited views. Multilevel degeneration spine. Surgical hardware is noted just posterior to the L4-L5 articular facets. Atherosclerotic plaque of the aorta. IMPRESSION: Surgical hardware just posterior to the L4-L5 articular facets. Electronically Signed   By: Iven Finn M.D.   On: 09/15/2021 16:16   DG C-Arm 1-60 Min-No Report  Result Date:  09/15/2021 Fluoroscopy was utilized by the requesting physician.  No radiographic interpretation.    Impression/Recommendations Principal Problem:   Spondylolisthesis, lumbar region Active Problems:   Hypertension   Hyperlipidemia   DM2 (diabetes mellitus, type 2) (HCC)   Hyponatremia   Hypokalemia  Spondylolisthesis -Patient is s/p spinal fusion yesterday and feels well today -Management per neurosurgery  Hyponatremia -Likely euvolemic, appears to be chronic in nature -Likely due to or exacerbated by chlorthalidone - but she reports marked LE edema when this was inadvertently held in the past -Will check urinary and serum Osm as well as urinary sodium levels -Repeat BMP in AM -She has K+ supplements listed on MAR and orders but not salt tablets; will hold salt tabs for now  HTN -Continue Atenolol, Benicar -Hold Chlorthalidone - this is  likely the culprit of/contributing to her chronic hyponatremia  Hypokalemia -K+ is 2.9, also likely related to chlorthalidone -Will supplement with 40 mEq KCl in addition to her usual 20 mEq daily -Recheck BMP in AM  DM -Recent A1c was 6.4, indicating good control -She does not appear to be taking medications for this issue at this time -Will add sensitive-scale SSI, as optimizing glycemic control is likely to help with wound healing   HLD -Continue Zocor   Thank you for this consultation.  Our [facility] hospitalist team will follow the patient with you.   Time Spent: 50 minutes  Karmen Bongo M.D. [facility] 09/16/2021, 1:15 PM

## 2021-09-17 DIAGNOSIS — I10 Essential (primary) hypertension: Secondary | ICD-10-CM

## 2021-09-17 DIAGNOSIS — E871 Hypo-osmolality and hyponatremia: Secondary | ICD-10-CM

## 2021-09-17 DIAGNOSIS — M4316 Spondylolisthesis, lumbar region: Secondary | ICD-10-CM | POA: Diagnosis not present

## 2021-09-17 LAB — BASIC METABOLIC PANEL
Anion gap: 7 (ref 5–15)
BUN: 14 mg/dL (ref 8–23)
CO2: 28 mmol/L (ref 22–32)
Calcium: 8.6 mg/dL — ABNORMAL LOW (ref 8.9–10.3)
Chloride: 93 mmol/L — ABNORMAL LOW (ref 98–111)
Creatinine, Ser: 0.71 mg/dL (ref 0.44–1.00)
GFR, Estimated: >60 mL/min (ref >60)
Glucose, Bld: 128 mg/dL — ABNORMAL HIGH (ref 70–99)
Potassium: 3.6 mmol/L (ref 3.5–5.1)
Sodium: 128 mmol/L — ABNORMAL LOW (ref 135–145)

## 2021-09-17 LAB — GLUCOSE, CAPILLARY: Glucose-Capillary: 127 mg/dL — ABNORMAL HIGH (ref 70–99)

## 2021-09-17 MED ORDER — HYDROCODONE-ACETAMINOPHEN 10-325 MG PO TABS: 1.0000 | ORAL_TABLET | ORAL | Status: DC | PRN

## 2021-09-17 MED ORDER — DOCUSATE SODIUM 100 MG PO CAPS
100.0000 mg | ORAL_CAPSULE | Freq: Two times a day (BID) | ORAL | 0 refills | Status: AC
Start: 2021-09-17 — End: ?

## 2021-09-17 MED ORDER — HYDROCODONE-ACETAMINOPHEN 5-325 MG PO TABS: 1.0000 | ORAL_TABLET | ORAL | 0 refills | Status: DC | PRN

## 2021-09-17 MED ORDER — HYDROCODONE-ACETAMINOPHEN 5-325 MG PO TABS
1.0000 | ORAL_TABLET | ORAL | Status: DC | PRN
  Administered 2021-09-17: 1 via ORAL
  Filled 2021-09-17: qty 1

## 2021-09-17 NOTE — Plan of Care (Signed)
  Problem: Health Behavior/Discharge Planning: Goal: Ability to manage health-related needs will improve Outcome: Adequate for Discharge   Problem: Clinical Measurements: Goal: Ability to maintain clinical measurements within normal limits will improve Outcome: Adequate for Discharge Goal: Will remain free from infection Outcome: Adequate for Discharge Goal: Diagnostic test results will improve Outcome: Adequate for Discharge Goal: Respiratory complications will improve Outcome: Adequate for Discharge Goal: Cardiovascular complication will be avoided Outcome: Adequate for Discharge   Problem: Education: Goal: Knowledge of General Education information will improve Description: Including pain rating scale, medication(s)/side effects and non-pharmacologic comfort measures Outcome: Adequate for Discharge   Problem: Health Behavior/Discharge Planning: Goal: Ability to manage health-related needs will improve Outcome: Adequate for Discharge   Problem: Clinical Measurements: Goal: Ability to maintain clinical measurements within normal limits will improve Outcome: Adequate for Discharge Goal: Will remain free from infection Outcome: Adequate for Discharge Goal: Diagnostic test results will improve Outcome: Adequate for Discharge Goal: Respiratory complications will improve Outcome: Adequate for Discharge Goal: Cardiovascular complication will be avoided Outcome: Adequate for Discharge   Problem: Activity: Goal: Risk for activity intolerance will decrease Outcome: Adequate for Discharge   Problem: Nutrition: Goal: Adequate nutrition will be maintained Outcome: Adequate for Discharge   Problem: Coping: Goal: Level of anxiety will decrease Outcome: Adequate for Discharge   Problem: Elimination: Goal: Will not experience complications related to bowel motility Outcome: Adequate for Discharge Goal: Will not experience complications related to urinary retention Outcome:  Adequate for Discharge

## 2021-09-17 NOTE — Discharge Summary (Signed)
Physician Discharge Summary  Patient ID: Terri Mccall MRN: [id number] DOB/AGE: 85/29/1935 85 y.o.  Admit date: 09/15/2021 Discharge date: 09/17/2021  Admission Diagnoses: Lumbar spinal stenosis, lumbar spinal stenosis, lumbago, lumbar radiculopathy, neurogenic claudication  Discharge Diagnoses: The same Principal Problem:   Spondylolisthesis, lumbar region Active Problems:   Hypertension   Hyperlipidemia   DM2 (diabetes mellitus, type 2) ([place])   Hyponatremia   Hypokalemia   Discharged Condition: good  Hospital Course: I performed a L3-4 and L4-5 decompression with instrumentation and fusion at L4-5 on the patient on 09/15/2021.  The surgery went well.  The patient's postoperative course was unremarkable.  On postoperative #2 she requested discharge to home.  The patient, and her husband, were given verbal and written discharge instructions.  All her questions were answered.  Consults: PT, OT, care management Significant Diagnostic Studies: None Treatments: L3-4 and L4-5 decompression, instrumentation and fusion at L4-5 Discharge Exam: Blood pressure (!) 127/50, pulse 82, temperature 98.6 F (37 C), temperature source Oral, resp. rate 18, height 5' (1.524 m), weight 66.2 kg, SpO2 (!) 89 %. The patient is alert and pleasant.  She looks well.  Her dressing is clean and dry.  Her strength is normal.  Disposition: Home  Discharge Instructions     Call MD for:  difficulty breathing, headache or visual disturbances   Complete by: As directed    Call MD for:  extreme fatigue   Complete by: As directed    Call MD for:  hives   Complete by: As directed    Call MD for:  persistant dizziness or light-headedness   Complete by: As directed    Call MD for:  persistant nausea and vomiting   Complete by: As directed    Call MD for:  redness, tenderness, or signs of infection (pain, swelling, redness, odor or green/yellow discharge around incision site)   Complete by: As directed     Call MD for:  severe uncontrolled pain   Complete by: As directed    Call MD for:  temperature >100.4   Complete by: As directed    Diet - low sodium heart healthy   Complete by: As directed    Discharge instructions   Complete by: As directed    Call [phone] for a followup appointment. Take a stool softener while you are using pain medications.   Driving Restrictions   Complete by: As directed    Do not drive for 2 weeks.   Increase activity slowly   Complete by: As directed    Lifting restrictions   Complete by: As directed    Do not lift more than 5 pounds. No excessive bending or twisting.   May shower / Bathe   Complete by: As directed    Remove the dressing for 3 days after surgery.  You may shower, but leave the incision alone.   Remove dressing in 24 hours   Complete by: As directed       Allergies as of 09/17/2021       Reactions   Amlodipine Other (See Comments)   Flush/ not really sure if this is the correct medication   Bextra [valdecoxib] Hives   Morphine And Related    Changed personality        Medication List     TAKE these medications    aspirin EC 81 MG tablet Take 81 mg by mouth every evening.   atenolol 12.5 mg Tabs tablet Commonly known as: TENORMIN Take 12.5 tablets by  mouth 2 (two) times daily.   beta carotene w/minerals tablet Take 1 tablet by mouth every morning.   CALCIUM 600+D PLUS MINERALS PO Take 1 tablet by mouth daily.   cetirizine 10 MG tablet Commonly known as: ZYRTEC Take 10 mg by mouth daily.   chlorthalidone 25 MG tablet Commonly known as: HYGROTON Take 25 mg by mouth daily.   Cinnamon 500 MG capsule Take 500 mg by mouth daily.   CRANBERRY PO Take 300 mg by mouth daily.   docusate sodium 100 MG capsule Commonly known as: COLACE Take 1 capsule (100 mg total) by mouth 2 (two) times daily.   famotidine 20 MG tablet Commonly known as: PEPCID Take 1 tablet (20 mg total) by mouth 2 (two) times daily. What  changed:  how much to take when to take this   fluticasone 50 MCG/ACT nasal spray Commonly known as: FLONASE Place 2 sprays into both nostrils daily.   glucosamine-chondroitin 500-400 MG tablet Take 2 tablets by mouth daily.   HYDROcodone-acetaminophen 5-325 MG tablet Commonly known as: NORCO/VICODIN Take 1 tablet by mouth every 4 (four) hours as needed for moderate pain.   hydrocortisone 2.5 % rectal cream Commonly known as: ANUSOL-HC Place 1 application rectally daily as needed for hemorrhoids.   latanoprost 0.005 % ophthalmic solution Commonly known as: XALATAN Place 1 drop into both eyes at bedtime.   LORazepam 0.5 MG tablet Commonly known as: ATIVAN Take 0.25 mg by mouth daily as needed for anxiety.   olmesartan 40 MG tablet Commonly known as: BENICAR Take 40 mg by mouth every morning.   pantoprazole 40 MG tablet Commonly known as: PROTONIX TAKE 1 TABLET BY MOUTH ONCE DAILY 30 TO 60 MINUTES BEFORE FIRST MEAL OF THE DAY.   potassium chloride SA 20 MEQ tablet Commonly known as: KLOR-CON Take 20 mEq by mouth every morning.   Premarin vaginal cream Generic drug: conjugated estrogens Place 1 Applicatorful vaginally once a week.   simvastatin 20 MG tablet Commonly known as: ZOCOR Take 20 mg by mouth every evening.   Systane 0.4-0.3 % Gel ophthalmic gel Generic drug: Polyethyl Glycol-Propyl Glycol Place 1 application into both eyes at bedtime.   Systane 0.4-0.3 % Soln Generic drug: Polyethyl Glycol-Propyl Glycol Place 1 drop into both eyes daily as needed (Dry eye).   timolol 0.25 % ophthalmic solution Commonly known as: BETIMOL Place 1 drop into the right eye daily.         Signed: Ophelia Charter 09/17/2021, 7:40 AM

## 2021-09-17 NOTE — Progress Notes (Signed)
Physical Therapy Treatment Patient Details Name: Terri Mccall MRN: [id number] DOB: [birth year] Today's Date: 09/17/2021   History of Present Illness 87 yo female s/p L4-5 PLIF, L3-4 laminectomy on 11/9. PMH includes OA, carpal tunnel with release, GERD, HTN, HLD, preDM.    PT Comments    Pt supine in bed on arrival.  She is pleasant and eager to participate in PT session.  Focused of transfer  training and progressing to stair training.  Pt is progressing well.  Will return home with f/u HHPT.     Recommendations for follow up therapy are one component of a multi-disciplinary discharge planning process, led by the attending physician.  Recommendations may be updated based on patient status, additional functional criteria and insurance authorization.  Follow Up Recommendations  Follow physician's recommendations for discharge plan and follow up therapies     Assistance Recommended at Discharge Intermittent Supervision/Assistance  Equipment Recommendations  Rolling walker (2 wheels);BSC/3in1 (YOU[employer] HEIGHT RW)    Recommendations for Other Services       Precautions / Restrictions Precautions Precautions: Fall;Back Precaution Booklet Issued: Yes (comment) Required Braces or Orthoses: Spinal Brace Spinal Brace: Applied in sitting position Restrictions Weight Bearing Restrictions: No     Mobility  Bed Mobility Overal bed mobility: Needs Assistance Bed Mobility: Rolling;Sidelying to Sit;Sit to Sidelying Rolling: Min guard Sidelying to sit: Min guard       General bed mobility comments: Pt used rails to roll.  Pt required HHA to rise into sitting after rolling.    Transfers Overall transfer level: Needs assistance Equipment used: Rolling walker (2 wheels) Transfers: Sit to/from Stand Sit to Stand: Min guard           General transfer comment: Cues for hand placement to and from seated surface.  Cues to prepare for transfer with foot placement and body position.     Ambulation/Gait Ambulation/Gait assistance: Supervision Gait Distance (Feet): 20 Feet Assistive device: Rolling walker (2 wheels) Gait Pattern/deviations: Step-through pattern;Decreased stride length;Trunk flexed Gait velocity: decr     General Gait Details: for safety, cues for upright posture and positioning in RW.   Stairs Stairs: Yes Stairs assistance: Min guard Stair Management: Two rails;Forwards Number of Stairs: 3 General stair comments: Cues for sequencing and safety, husband present to watch session.   Wheelchair Mobility    Modified Rankin (Stroke Patients Only)       Balance Overall balance assessment: Needs assistance Sitting-balance support: Feet supported;No upper extremity supported Sitting balance-Leahy Scale: Good       Standing balance-Leahy Scale: Fair                              Cognition Arousal/Alertness: Awake/alert Behavior During Therapy: WFL for tasks assessed/performed Overall Cognitive Status: Within Functional Limits for tasks assessed                                          Exercises      General Comments        Pertinent Vitals/Pain Pain Assessment: 0-10 Pain Score: 6  Pain Location: low back Pain Descriptors / Indicators: Sore Pain Intervention(s): Monitored during session;Repositioned    Home Living                          Prior Function  PT Goals (current goals can now be found in the care plan section) Acute Rehab PT Goals Patient Stated Goal: home Potential to Achieve Goals: Good Progress towards PT goals: Progressing toward goals    Frequency    Min 5X/week      PT Plan Current plan remains appropriate    Co-evaluation              AM-PAC PT "6 Clicks" Mobility   Outcome Measure  Help needed turning from your back to your side while in a flat bed without using bedrails?: A Little Help needed moving from lying on your back to sitting  on the side of a flat bed without using bedrails?: A Little Help needed moving to and from a bed to a chair (including a wheelchair)?: A Little Help needed standing up from a chair using your arms (e.g., wheelchair or bedside chair)?: A Little Help needed to walk in hospital room?: A Little Help needed climbing 3-5 steps with a railing? : A Little 6 Click Score: 18    End of Session Equipment Utilized During Treatment: Gait belt Activity Tolerance: Patient tolerated treatment well;Patient limited by fatigue;Patient limited by pain Patient left: in chair;with call bell/phone within reach;with family/visitor present Nurse Communication: Mobility status PT Visit Diagnosis: Other abnormalities of gait and mobility (R26.89);History of falling (Z91.81)     Time: 1027-1050 PT Time Calculation (min) (ACUTE ONLY): 23 min  Charges:  $Gait Training: 8-22 mins $Therapeutic Activity: 8-22 mins                     [name] , PTA Acute Rehabilitation Services Pager [phone] Office [phone]    [name] 09/17/2021, 11:04 AM

## 2021-09-17 NOTE — Plan of Care (Signed)
  Problem: Activity: Goal: Risk for activity intolerance will decrease Outcome: Progressing   Problem: Nutrition: Goal: Adequate nutrition will be maintained Outcome: Progressing   Problem: Coping: Goal: Level of anxiety will decrease Outcome: Progressing   Problem: Elimination: Goal: Will not experience complications related to bowel motility Outcome: Progressing   

## 2021-09-17 NOTE — Progress Notes (Signed)
[facility]  PROGRESS NOTE  Terri Mccall EHM:[id number] DOB: 1934/06/10 DOA: 09/15/2021 PCP: Asencion Noble, MD   Brief HPI:   87 year old female with history of hypertension, hyperlipidemia, prediabetes presented with radicular symptoms due to lumbar spinal stenosis.  She underwent laminectomy/foraminotomies/facetectomy with spinal fusion by Dr. Arnoldo Morale.  She was found to be hyponatremic TRH was consulted for hyponatremia    Subjective   Patient seen and examined, denies any complaints.  Sodium has improved to 128.  Patient is on chlorthalidone at home.   Assessment/Plan:     Hyponatremia -Likely chronic, past serum sodium was in 120s in 2017 -Serum osmolality is 265; consistent with SIADH -Patient wants to continue with chlorthalidone   Spondylolisthesis -Patient is s/p spinal fusion -Management per neurosurgery  Hypertension -Continue atenolol, Benicar -Patient is also on chlorthalidone, likely culprit for hyponatremia --She will discuss with her PCP regarding stopping this medication -Consider low-dose Lasix 20 mg daily in place of chlorthalidone for lower extremity swelling as that would not cause hyponatremia  Hyperlipidemia -Continue Zocor  Diabetes mellitus type 2 -Hemoglobin A1c 6.4, indicating good control of blood glucose -Patient is not on medications for diabetes melitis       Scheduled medications:    atenolol  12.5 mg Oral BID   chlorthalidone  25 mg Oral Daily   docusate sodium  100 mg Oral BID   famotidine  10 mg Oral QHS   fluticasone  2 spray Each Nare Daily   insulin aspart  0-9 Units Subcutaneous TID WC   irbesartan  300 mg Oral Daily   latanoprost  1 drop Both Eyes QHS   loratadine  10 mg Oral Daily   pantoprazole  20 mg Oral BID   polyvinyl alcohol  1 drop Both Eyes QHS   potassium chloride SA  20 mEq Oral q morning   simvastatin  20 mg Oral QPM   sodium chloride flush  3 mL Intravenous Q12H   timolol  1 drop Right Eye Daily      Data Reviewed:   CBG:  Recent Labs  Lab 09/13/21 1112 09/15/21 1121 09/15/21 1436 09/16/21 2037 09/17/21 0655  GLUCAP 154* 111* 93 139* 127*    SpO2: (!) 89 % O2 Flow Rate (L/min): 1 L/min    Vitals:   09/16/21 1637 09/16/21 2000 09/17/21 0437 09/17/21 0909  BP: (!) 129/46 (!) 132/58 (!) 127/50 (!) 106/45  Pulse: 90 87 82 90  Resp: 16 18 18 16   Temp: 98.6 F (37 C) 98.2 F (36.8 C) 98.6 F (37 C) 98.5 F (36.9 C)  TempSrc: Oral Oral Oral Oral  SpO2: 93% 96% (!) 89% (!) 89%  Weight:      Height:         Intake/Output Summary (Last 24 hours) at 09/17/2021 1001 Last data filed at 09/17/2021 0900 Gross per 24 hour  Intake 600 ml  Output 200 ml  Net 400 ml    11/09 1901 - 11/11 0700 In: 1064.8 [P.O.:960; I.V.:4.8] Out: 1100 [Urine:1100]  Filed Weights   09/15/21 1117 09/15/21 2037  Weight: 62.1 kg 66.2 kg    Data Reviewed: Basic Metabolic Panel: Recent Labs  Lab 09/13/21 1200 09/16/21 0316 09/17/21 0226  NA 129* 125* 128*  K 3.8 2.9* 3.6  CL 94* 92* 93*  CO2 25 23 28   GLUCOSE 102* 180* 128*  BUN 10 9 14   CREATININE 0.60 0.61 0.71  CALCIUM 9.8 8.6* 8.6*   Liver Function Tests: No results for input(s): AST,  ALT, ALKPHOS, BILITOT, PROT, ALBUMIN in the last 168 hours. No results for input(s): LIPASE, AMYLASE in the last 168 hours. No results for input(s): AMMONIA in the last 168 hours. CBC: Recent Labs  Lab 09/13/21 1200 09/16/21 0316  WBC 6.8 12.1*  HGB 13.1 10.4*  HCT 40.2 30.5*  MCV 88.7 85.2  PLT 215 171   Cardiac Enzymes: No results for input(s): CKTOTAL, CKMB, CKMBINDEX, TROPONINI in the last 168 hours. BNP (last 3 results) No results for input(s): BNP in the last 8760 hours.  ProBNP (last 3 results) No results for input(s): PROBNP in the last 8760 hours.  CBG: Recent Labs  Lab 09/13/21 1112 09/15/21 1121 09/15/21 1436 09/16/21 2037 09/17/21 0655  GLUCAP 154* 111* 93 139* 127*       Radiology Reports  DG  Lumbar Spine 2-3 Views  Result Date: 09/15/2021 CLINICAL DATA:  Lumbar fusion. EXAM: LUMBAR SPINE - 2-3 VIEW COMPARISON:  None. FINDINGS: Two intraoperative fluoroscopic spot images provided. The total fluoroscopic time is 20 seconds with a total air Karma of 9.95 mGy. There is L4-L5 posterior fusion. IMPRESSION: Intraoperative fluoroscopic spot images of the lumbar spine. Electronically Signed   By: Anner Crete M.D.   On: 09/15/2021 19:33   DG Lumbar Spine 1 View  Result Date: 09/15/2021 CLINICAL DATA:  Localization film for L5-L4 posterior lumbar interbody fusion EXAM: LUMBAR SPINE - 1 VIEW COMPARISON:  X-ray lumbar spine 08/19/2021 FINDINGS: Intraoperative lumbar surgery at the L4-L5 level. One low resolution intraoperative spot views of the lumbar spine was obtained. No fracture visible on the limited views. Multilevel degeneration spine. Surgical hardware is noted just posterior to the L4-L5 articular facets. Atherosclerotic plaque of the aorta. IMPRESSION: Surgical hardware just posterior to the L4-L5 articular facets. Electronically Signed   By: Iven Finn M.D.   On: 09/15/2021 16:16   DG C-Arm 1-60 Min-No Report  Result Date: 09/15/2021 Fluoroscopy was utilized by the requesting physician.  No radiographic interpretation.       Antibiotics: Anti-infectives (From admission, onward)    Start     Dose/Rate Route Frequency Ordered Stop   09/15/21 2300  ceFAZolin (ANCEF) IVPB 2g/100 mL premix        2 g 200 mL/hr over 30 Minutes Intravenous Every 8 hours 09/15/21 2033 09/16/21 1021   09/15/21 1115  ceFAZolin (ANCEF) IVPB 2g/100 mL premix        2 g 200 mL/hr over 30 Minutes Intravenous On call to O.R. 09/15/21 1111 09/15/21 1505   09/15/21 1115  ceFAZolin (ANCEF) 2-4 GM/100ML-% IVPB       Note to Pharmacy: Tamsen Snider   : cabinet override      09/15/21 1115 09/15/21 1507           Objective    Physical Examination:   General-appears in no acute  distress Heart-S1-S2, regular, no murmur auscultated Lungs-clear to auscultation bilaterally, no wheezing or crackles auscultated Abdomen-soft, nontender, no organomegaly Extremities-no edema in the lower extremities Neuro-alert, oriented x3, no focal deficit noted  COVID-19 Labs  No results for input(s): DDIMER, FERRITIN, LDH, CRP in the last 72 hours.  Lab Results  Component Value Date   [place] NEGATIVE 09/13/2021            Recent Results (from the past 240 hour(s))  SARS CORONAVIRUS 2 (TAT 6-24 HRS) Nasopharyngeal Nasopharyngeal Swab     Status: None   Collection Time: 09/13/21 11:43 AM   Specimen: Nasopharyngeal Swab  Result Value Ref Range Status  SARS Coronavirus 2 NEGATIVE NEGATIVE Final    Comment: (NOTE) SARS-CoV-2 target nucleic acids are NOT DETECTED.  The SARS-CoV-2 RNA is generally detectable in upper and lower respiratory specimens during the acute phase of infection. Negative results do not preclude SARS-CoV-2 infection, do not rule out co-infections with other pathogens, and should not be used as the sole basis for treatment or other patient management decisions. Negative results must be combined with clinical observations, patient history, and epidemiological information. The expected result is Negative.  Fact Sheet for Patients: SugarRoll.be  Fact Sheet for Healthcare Providers: https://www.woods-mathews.com/  This test is not yet approved or cleared by the [place] FDA and  has been authorized for detection and/or diagnosis of SARS-CoV-2 by FDA under an Emergency Use Authorization (EUA). This EUA will remain  in effect (meaning this test can be used) for the duration of the COVID-19 declaration under Se ction 564(b)(1) of the Act, 21 U.S.C. section 360bbb-3(b)(1), unless the authorization is terminated or revoked sooner.  Performed at [facility], [place] [address], [place],  [place] [address]   Surgical pcr screen     Status: None   Collection Time: 09/13/21 11:43 AM   Specimen: Nasal Mucosa; Nasal Swab  Result Value Ref Range Status   MRSA, PCR NEGATIVE NEGATIVE Final   Staphylococcus aureus NEGATIVE NEGATIVE Final    Comment: (NOTE) The Xpert SA Assay (FDA approved for NASAL specimens in patients 61 years of age and older), is one component of a comprehensive surveillance program. It is not intended to diagnose infection nor to guide or monitor treatment. Performed at [facility], [place] [address], [place], [place] [address]     [facility] If 7PM-7AM, please contact night-coverage at [email or web address], Office  [phone]   09/17/2021, 10:01 AM  LOS: 0 days

## 2021-09-17 NOTE — Progress Notes (Signed)
Occupational Therapy Treatment Patient Details Name: Terri Mccall MRN: [id number] DOB: 05-Jun-1934 Today's Date: 09/17/2021   History of present illness 87 yo female s/p L4-5 PLIF, L3-4 laminectomy on 11/9. PMH includes OA, carpal tunnel with release, GERD, HTN, HLD, preDM.   OT comments  Pt planning for dc later today. Reviewing compensatory techniques for LB dressing and providing education on use of reacher as pt with difficulty maintaining figure four position. Pt requiring Min A for LB dressing. Pt donning brace with cues for positioning. Reviewed all education with pt and husband. Also discussed use of 3n1 for shower seat and toilet riser as it has arm rest to push up from; both agreeable. Continue to recommend dc to home and will continue to follow acutely as admitted.    Recommendations for follow up therapy are one component of a multi-disciplinary discharge planning process, led by the attending physician.  Recommendations may be updated based on patient status, additional functional criteria and insurance authorization.    Follow Up Recommendations  No OT follow up    Assistance Recommended at Discharge Intermittent Supervision/Assistance  Equipment Recommendations  BSC/3in1    Recommendations for Other Services PT consult    Precautions / Restrictions Precautions Precautions: Fall;Back Precaution Booklet Issued: Yes (comment) Precaution Comments: Difficulty recalling back precuation when asked directly. But able to verablize them while performing ADLs Required Braces or Orthoses: Spinal Brace Spinal Brace: Applied in sitting position Restrictions Weight Bearing Restrictions: No       Mobility Bed Mobility Overal bed mobility: Needs Assistance Bed Mobility: Rolling;Sidelying to Sit;Sit to Sidelying Rolling: Min guard Sidelying to sit: Min guard       General bed mobility comments: Pt sitting in reclienr upon arrival    Transfers Overall transfer level: Needs  assistance Equipment used: Rolling walker (2 wheels) Transfers: Sit to/from Stand Sit to Stand: Min guard           General transfer comment: Cues for hand placement to and from seated surface.  Cues to prepare for transfer with foot placement and body position.     Balance Overall balance assessment: Needs assistance Sitting-balance support: Feet supported;No upper extremity supported Sitting balance-Leahy Scale: Good       Standing balance-Leahy Scale: Fair                             ADL either performed or assessed with clinical judgement   ADL Overall ADL's : Needs assistance/impaired                 Upper Body Dressing : Min guard;Sitting Upper Body Dressing Details (indicate cue type and reason): donning brace Lower Body Dressing: With adaptive equipment;Sit to/from stand;Minimal assistance Lower Body Dressing Details (indicate cue type and reason): Providing education on use of reacher for donning underwear and pants as pt with difficulty performing figure four safely. Husband also reporting he can assist at home Toilet Transfer: Min guard (simulated in room)           Functional mobility during ADLs: Min guard General ADL Comments: Pt performing dressing. Cues for compensatory techniques    Extremity/Trunk Assessment Upper Extremity Assessment Upper Extremity Assessment: Overall WFL for tasks assessed   Lower Extremity Assessment Lower Extremity Assessment: Defer to PT evaluation        Vision       Perception     Praxis      Cognition Arousal/Alertness: Awake/alert Behavior During Therapy:  WFL for tasks assessed/performed Overall Cognitive Status: Within Functional Limits for tasks assessed                                 General Comments: Slightly difficulty with problem solving but may be do to pain medication          Exercises     Shoulder Instructions       General Comments Husband present  throughout    Pertinent Vitals/ Pain       Pain Assessment: 0-10 Pain Score: 6  Pain Location: low back Pain Descriptors / Indicators: Sore Pain Intervention(s): Monitored during session;Limited activity within patient's tolerance;Repositioned  Home Living                                          Prior Functioning/Environment              Frequency  Min 2X/week        Progress Toward Goals  OT Goals(current goals can now be found in the care plan section)  Progress towards OT goals: Progressing toward goals  Acute Rehab OT Goals Patient Stated Goal: Return home OT Goal Formulation: With patient/family Time For Goal Achievement: 09/30/21 Potential to Achieve Goals: Good ADL Goals Pt Will Perform Upper Body Bathing: with supervision;sitting Pt Will Perform Lower Body Bathing: with supervision;sitting/lateral leans Pt Will Perform Upper Body Dressing: with supervision;sitting Pt Will Perform Lower Body Dressing: with supervision;sit to/from stand Pt Will Perform Tub/Shower Transfer: with supervision;shower seat;rolling walker  Plan Discharge plan remains appropriate    Co-evaluation                 AM-PAC OT "6 Clicks" Daily Activity     Outcome Measure   Help from another person eating meals?: None Help from another person taking care of personal grooming?: None Help from another person toileting, which includes using toliet, bedpan, or urinal?: A Little Help from another person bathing (including washing, rinsing, drying)?: A Little Help from another person to put on and taking off regular upper body clothing?: A Little Help from another person to put on and taking off regular lower body clothing?: A Lot 6 Click Score: 19    End of Session Equipment Utilized During Treatment: Gait belt;Rolling walker (2 wheels)  OT Visit Diagnosis: Unsteadiness on feet (R26.81);Other abnormalities of gait and mobility (R26.89);Muscle weakness  (generalized) (M62.81);Pain   Activity Tolerance Patient tolerated treatment well   Patient Left with family/visitor present;in chair;with chair alarm set;with call bell/phone within reach   Nurse Communication Mobility status        Time: 7867-6720 OT Time Calculation (min): 35 min  Charges: OT General Charges $OT Visit: 1 Visit OT Treatments $Self Care/Home Management : 23-37 mins  [place], OTR/L Acute Rehab Pager: [phone] Office: [place] 09/17/2021, 11:53 AM

## 2021-09-17 NOTE — TOC Transition Note (Signed)
Transition of Care (TOC) - CM/SW Discharge Note Marvetta Gibbons RN, BSN Transitions of Care Unit 4E- RN Case Manager See Treatment Team for direct phone #  Cross coverage for 5N  Patient Details  Name: SIMRAH CHATHAM MRN: [id number] Date of Birth: [birth year]  Transition of Care [facility]) CM/SW Contact:  Dawayne Patricia, RN Phone Number: 09/17/2021, 2:43 PM   Clinical Narrative:    Pt stable for discharge [place] and DME orders have been placed.  Call made to Pt will no answer, CSW to room to speak with pt and offer [place] choice Per Reandra CSW- pt states she has used [facility] in the past and would like to use them again.  Address confirmed by CSW as-  [address] [place] [address] [place] [address]  Call made to [facility] for DME needs - RW-youth, and 3n1 arranged and to be delivered to room prior to discharge  Call made to Amy with [facility] for HHPT referral - referral has been accepted.    Final next level of care: [place] Barriers to Discharge: No Barriers Identified   Patient Goals and CMS Choice Patient states their goals for this hospitalization and ongoing recovery are:: return home CMS Medicare.gov Compare Post Acute Care list provided to:: Patient Choice offered to / list presented to : Patient  Discharge Placement               Home w/ [facility]        Discharge Plan and Services   Discharge Planning Services: CM Consult Post Acute Care Choice: Home Health, Durable Medical Equipment          DME Arranged: 3-N-1, Walker rolling DME Agency: [facility] Date DME Agency Contacted: 09/17/21 Time DME Agency Contacted: 5 Representative spoke with at DME Agency: Freda Munro HH Arranged: PT [place]: [place] Date [place]: 09/17/21 Time [place]: [place] Representative spoke with at [place]: Amy  Social Determinants of Health ([place]) Interventions     Readmission Risk Interventions No flowsheet data found.

## 2021-09-17 NOTE — Progress Notes (Signed)
Discharge instruction given to the patient and her husband and both stated understanding. Questions and concerns answered. IV discontinued.

## 2021-09-18 LAB — GLUCOSE, CAPILLARY: Glucose-Capillary: 130 mg/dL — ABNORMAL HIGH (ref 70–99)

## 2021-09-23 MED FILL — Sodium Chloride IV Soln 0.9%: INTRAVENOUS | Qty: 1000 | Status: AC

## 2021-09-23 MED FILL — Heparin Sodium (Porcine) Inj 1000 Unit/ML: INTRAMUSCULAR | Qty: 30 | Status: AC

## 2021-10-19 ENCOUNTER — Other Ambulatory Visit: Payer: Self-pay | Admitting: Internal Medicine

## 2021-10-19 DIAGNOSIS — R918 Other nonspecific abnormal finding of lung field: Secondary | ICD-10-CM

## 2021-11-17 ENCOUNTER — Other Ambulatory Visit: Payer: Self-pay | Admitting: Internal Medicine

## 2021-11-17 DIAGNOSIS — R918 Other nonspecific abnormal finding of lung field: Secondary | ICD-10-CM

## 2021-12-21 ENCOUNTER — Encounter: Payer: Self-pay | Admitting: Podiatry

## 2021-12-21 ENCOUNTER — Other Ambulatory Visit: Payer: Self-pay

## 2021-12-21 ENCOUNTER — Ambulatory Visit (INDEPENDENT_AMBULATORY_CARE_PROVIDER_SITE_OTHER): Payer: Medicare Other | Admitting: Podiatry

## 2021-12-21 DIAGNOSIS — L989 Disorder of the skin and subcutaneous tissue, unspecified: Secondary | ICD-10-CM | POA: Diagnosis not present

## 2021-12-21 NOTE — Progress Notes (Signed)
° °  Subjective: 88 y.o. female presenting to the office today for evaluation of symptomatic corns and calluses to the bilateral feet.  Patient states that she has developed some symptomatic calluses especially between the fourth and fifth toes of the right foot as well as the left foot.  She would like to have them debrided today.  She presents for further treatment evaluation   Past Medical History:  Diagnosis Date   Arthritis    Carpal tunnel syndrome of left wrist    Constipation    GERD (gastroesophageal reflux disease)    Hyperlipidemia    Hypertension    Pre-diabetes      Objective:  Physical Exam General: Alert and oriented x3 in no acute distress  Dermatology: Hyperkeratotic lesion(s) present on the bilateral feet. Pain on palpation with a central nucleated core noted. Skin is warm, dry and supple bilateral lower extremities. Negative for open lesions or macerations.  Vascular: Palpable pedal pulses bilaterally. No edema or erythema noted. Capillary refill within normal limits.  Neurological: Epicritic and protective threshold grossly intact bilaterally.   Musculoskeletal Exam: Pain on palpation at the keratotic lesion(s) noted. Range of motion within normal limits bilateral. Muscle strength 5/5 in all groups bilateral.  Assessment: 1.  Symptomatic corns and calluses bilateral feet   Plan of Care:  1. Patient evaluated 2. Excisional debridement of keratoic lesion(s) using a chisel blade was performed without incident.  3.  Recommend wide fitting shoes that do not constrict the toebox area 4. Patient is to return to the clinic PRN.   Edrick Kins, DPM [facility]  Dr. Edrick Kins, DPM    [address], [place] [address]                Office [phone]  Fax [phone]

## 2022-10-14 ENCOUNTER — Ambulatory Visit (INDEPENDENT_AMBULATORY_CARE_PROVIDER_SITE_OTHER): Payer: Medicare Other | Admitting: Podiatry

## 2022-10-14 DIAGNOSIS — L989 Disorder of the skin and subcutaneous tissue, unspecified: Secondary | ICD-10-CM | POA: Diagnosis not present

## 2022-10-19 NOTE — Progress Notes (Signed)
   Chief Complaint  Patient presents with   Callouses    Patient is here for corn on right foot 4th and fifth toe that are very painful.    Subjective: 88 y.o. female presenting to the office today for evaluation of symptomatic corns and calluses to the bilateral feet.  Patient states that she has developed some symptomatic calluses especially between the fourth and fifth toes of the right foot as well as the left foot.  She would like to have them debrided today.  She presents for further treatment evaluation   Past Medical History:  Diagnosis Date   Arthritis    Carpal tunnel syndrome of left wrist    Constipation    GERD (gastroesophageal reflux disease)    Hyperlipidemia    Hypertension    Pre-diabetes      Objective:  Physical Exam General: Alert and oriented x3 in no acute distress  Dermatology: Hyperkeratotic lesion(s) present on the bilateral feet. Pain on palpation with a central nucleated core noted. Skin is warm, dry and supple bilateral lower extremities. Negative for open lesions or macerations.  Vascular: Palpable pedal pulses bilaterally. No edema or erythema noted. Capillary refill within normal limits.  Neurological: Epicritic and protective threshold grossly intact bilaterally.   Musculoskeletal Exam: Pain on palpation at the keratotic lesion(s) noted. Range of motion within normal limits bilateral. Muscle strength 5/5 in all groups bilateral.  Assessment: 1.  Symptomatic corns and calluses bilateral feet   Plan of Care:  1. Patient evaluated 2. Excisional debridement of keratoic lesion(s) using a chisel blade was performed without incident.  3.  Recommend wide fitting shoes that do not constrict the toebox area 4. Patient is to return to the clinic PRN.   Edrick Kins, DPM [facility]  Dr. Edrick Kins, DPM    [address], [place] [address]                Office [phone]  Fax  [phone]

## 2022-12-14 ENCOUNTER — Ambulatory Visit (INDEPENDENT_AMBULATORY_CARE_PROVIDER_SITE_OTHER): Payer: Medicare Other | Admitting: Podiatry

## 2022-12-14 VITALS — BP 183/88

## 2022-12-14 DIAGNOSIS — L989 Disorder of the skin and subcutaneous tissue, unspecified: Secondary | ICD-10-CM

## 2022-12-14 NOTE — Progress Notes (Signed)
   Chief Complaint  Patient presents with   Callouses    Patient came in today for a follow-up corn on the 5th toe between the 4th and 5th toes     Subjective: 89 y.o. female presenting to the office today for follow-up evaluation of symptomatic corns and calluses to the bilateral feet.  Patient states that the majority of her pain is associated to the fifth digit of the right foot.  She continues to have pain and tenderness to this area.  She says that routine debridement only helped for a couple of days.  She is hoping for more lasting alleviation of her symptoms.  She does wear wide shoes that do not constrict the toes.  She presents for further treatment and evaluation   Past Medical History:  Diagnosis Date   Arthritis    Carpal tunnel syndrome of left wrist    Constipation    GERD (gastroesophageal reflux disease)    Hyperlipidemia    Hypertension    Pre-diabetes      Objective:  Physical Exam General: Alert and oriented x3 in no acute distress  Dermatology: Hyperkeratotic lesion(s) present on the bilateral feet. Pain on palpation with a central nucleated core noted. Skin is warm, dry and supple bilateral lower extremities. Negative for open lesions or macerations.  Vascular: Palpable pedal pulses bilaterally. No edema or erythema noted. Capillary refill within normal limits.  Neurological: Epicritic and protective threshold grossly intact bilaterally.   Musculoskeletal Exam: Pain on palpation at the keratotic lesion(s) noted. Range of motion within normal limits bilateral. Muscle strength 5/5 in all groups bilateral.  Assessment: 1.  Symptomatic corns and calluses bilateral feet; most symptomatic on the right fifth digit   Plan of Care:  1. Patient evaluated 2. Excisional debridement of keratoic lesion(s) using a chisel blade was performed without incident.  3.  Unfortunately conservative treatment including wider shoes, corn pads, and excisional debridement does not  provide any alleviation of symptoms for the patient.  She continues to have pain and tenderness associated to the skin lesion of the right fifth toe.  I do believe it is appropriate this time to discuss excision of the small skin lesion which will be minimally invasive and performed here in the office.  The procedure was explained in detail including the elliptical skin wedge to remove the incision followed by 1 or 2 sutures for primary closure.  Patient agrees and like to proceed with this plan.  Postoperative recovery course including weightbearing and keeping the dressings clean dry and intact for a week were explained. 4.  Authorization for an office procedure was performed today.  Procedure will consist of excision of benign skin lesion right fifth digit 5.  Return to clinic morning of in office procedure  Edrick Kins, DPM [facility]  Dr. Edrick Kins, DPM    [address], [place] [address]                Office [phone]  Fax [phone]

## 2023-01-04 ENCOUNTER — Ambulatory Visit (INDEPENDENT_AMBULATORY_CARE_PROVIDER_SITE_OTHER): Payer: Medicare Other | Admitting: Podiatry

## 2023-01-04 VITALS — BP 172/75 | HR 85 | Resp 19

## 2023-01-04 DIAGNOSIS — D2371 Other benign neoplasm of skin of right lower limb, including hip: Secondary | ICD-10-CM | POA: Diagnosis not present

## 2023-01-04 DIAGNOSIS — L989 Disorder of the skin and subcutaneous tissue, unspecified: Secondary | ICD-10-CM | POA: Diagnosis not present

## 2023-01-04 MED ORDER — TRAMADOL HCL 50 MG PO TABS
50.0000 mg | ORAL_TABLET | ORAL | 0 refills | Status: AC | PRN
Start: 2023-01-04 — End: 2023-01-09

## 2023-01-04 NOTE — Progress Notes (Signed)
   OPERATIVE REPORT Patient name: Terri Mccall MRN: [id number] DOB: [birth year]  DOS:  01/04/23  Preop Dx: Symptomatic benign skin lesion medial aspect of the right fifth digit Postop Dx: same  Procedure:  1.  Excision of benign skin lesion medial aspect right fifth digit  Surgeon: Edrick Kins DPM  Anesthesia: 2% lidocaine totaling 3 mL infiltrated around the fifth digit right foot  Hemostasis: None  EBL: Minimal mL Materials: None Injectables: None Pathology: None  Condition: The patient tolerated the procedure and anesthesia well. No complications noted or reported   Justification for procedure: The patient is a 89 y.o. female who presents today for correction of a chronically symptomatic skin lesion to the medial aspect of the right fifth digit. Conservative modalities of been unsuccessful in providing any sort of satisfactory alleviation of symptoms with the patient. The patient was told benefits as well as possible side effects of the surgery. The patient consented for surgical correction. The patient consent form was reviewed. All patient questions were answered. No guarantees were expressed or implied.   Procedure in Detail: The patient was brought to the procedure room, placed in the procedure chair in the supine position at which time an aseptic scrub and drape were performed about the patient's respective lower extremity after anesthesia was induced as described above. Attention was then directed to the surgical area where procedure number one commenced.  Procedure #1: Excision of benign skin lesion medial aspect right fifth digit A 1.5 cm elliptical longitudinally oriented skin incision was planned and made around the symptomatic skin lesion of the right fifth digit.  The lesion was ellipsed and the skin wedge was removed in toto.  There was no appreciable underlying spur or bony prominence.  Light irrigation was utilized and primary closure was achieved using 4-0 Prolene  suture.  Dry sterile compressive dressings were then applied about the patient's lower extremity. The patient was then discharged from the office with adequate prescriptions for analgesia. Verbal as well as written instructions were provided for the patient regarding postprocedural care. The patient is to keep the dressings clean dry and intact until they are to follow up in the office upon discharge in one week.   Edrick Kins, DPM [facility]  Dr. Edrick Kins, DPM    [address], [place] [address]                Office [phone]  Fax [phone]

## 2023-01-11 ENCOUNTER — Ambulatory Visit (INDEPENDENT_AMBULATORY_CARE_PROVIDER_SITE_OTHER): Payer: Medicare Other | Admitting: Podiatry

## 2023-01-11 ENCOUNTER — Encounter: Payer: Medicare Other | Admitting: Podiatry

## 2023-01-11 DIAGNOSIS — Z9889 Other specified postprocedural states: Secondary | ICD-10-CM

## 2023-01-11 NOTE — Progress Notes (Signed)
   Chief Complaint  Patient presents with   Routine Post Op    POV#1 EXC BENIGN SKIN LESION RIGHT 5TH TOE, Patient denies any N/V/F/C/SOB,      Subjective:  Patient presents today status post excision of a benign skin lesion to the medial aspect of the right fifth toe performed in the office on 01/04/2023.  Patient states that she is doing well.  She has no pain or tenderness associated to the toe.  Dressings have been clean dry intact and she is weightbearing in the surgical shoe.  Past Medical History:  Diagnosis Date   Arthritis    Carpal tunnel syndrome of left wrist    Constipation    GERD (gastroesophageal reflux disease)    Hyperlipidemia    Hypertension    Pre-diabetes     Past Surgical History:  Procedure Laterality Date   ABDOMINAL HYSTERECTOMY     APPENDECTOMY     BACK SURGERY  1998   CARPAL TUNNEL RELEASE Left 03/06/2018   Procedure: LEFT CARPAL TUNNEL RELEASE;  Surgeon: Daryll Brod, MD;  Location: [facility];  Service: Orthopedics;  Laterality: Left;   CHOLECYSTECTOMY N/A 09/27/2016   Procedure: LAPAROSCOPIC CHOLECYSTECTOMY;  Surgeon: Vickie Epley, MD;  [facility];  Service: General;  Laterality: N/A;   COLONOSCOPY  04/26/2012   Procedure: COLONOSCOPY;  Surgeon: Rogene Houston, MD;  [facility];  Service: Endoscopy;  Laterality: N/A;  [place]  11/20/2012   Procedure: RELEASE TRIGGER FINGER/A-1 PULLEY;  Surgeon: Wynonia Sours, MD;  Location: [facility];  Service: Orthopedics;  Laterality: Right;  RELEASE A-1 PULLEY RIGHT INDEX FINGER    Allergies  Allergen Reactions   Amlodipine Other (See Comments)    Flush/ not really sure if this is the correct medication   Bextra [Valdecoxib] Hives   Morphine And Related     Changed personality    Objective/Physical Exam Neurovascular status intact.  Incision well coapted with sutures intact. No sign of infectious process noted. No  dehiscence. No active bleeding noted.  Overall well-healing surgical toe   Assessment: 1. s/p excision of benign skin lesion performed in office 01/04/2023.   -Patient evaluated. -Recommend triple antibiotic ointment and a Band-Aid daily to the incision site.  Overall the toe is healing nicely.  Patient may transition out of the postsurgical shoe back into regular tennis shoes -Return to clinic 1 week suture removal  Edrick Kins, DPM [facility]  Dr. Edrick Kins, DPM    [address], [place] [address]                Office [phone]  Fax [phone]

## 2023-01-18 ENCOUNTER — Ambulatory Visit (INDEPENDENT_AMBULATORY_CARE_PROVIDER_SITE_OTHER): Payer: Medicare Other | Admitting: Podiatry

## 2023-01-18 DIAGNOSIS — Z9889 Other specified postprocedural states: Secondary | ICD-10-CM

## 2023-01-18 NOTE — Progress Notes (Signed)
   Chief Complaint  Patient presents with   Routine Post Op    Pt stated that she is doing okay     Subjective:  Patient presents today status post excision of a benign skin lesion to the medial aspect of the right fifth toe performed in the office on 01/04/2023.  Patient continues to do well.  No pain associated to the toe.  No new complaints at this time  Past Medical History:  Diagnosis Date   Arthritis    Carpal tunnel syndrome of left wrist    Constipation    GERD (gastroesophageal reflux disease)    Hyperlipidemia    Hypertension    Pre-diabetes     Past Surgical History:  Procedure Laterality Date   ABDOMINAL HYSTERECTOMY     APPENDECTOMY     BACK SURGERY  1998   CARPAL TUNNEL RELEASE Left 03/06/2018   Procedure: LEFT CARPAL TUNNEL RELEASE;  Surgeon: Daryll Brod, MD;  Location: [facility];  Service: Orthopedics;  Laterality: Left;   CHOLECYSTECTOMY N/A 09/27/2016   Procedure: LAPAROSCOPIC CHOLECYSTECTOMY;  Surgeon: Vickie Epley, MD;  [facility];  Service: General;  Laterality: N/A;   COLONOSCOPY  04/26/2012   Procedure: COLONOSCOPY;  Surgeon: Rogene Houston, MD;  [facility];  Service: Endoscopy;  Laterality: N/A;  [place]  11/20/2012   Procedure: RELEASE TRIGGER FINGER/A-1 PULLEY;  Surgeon: Wynonia Sours, MD;  Location: [facility];  Service: Orthopedics;  Laterality: Right;  RELEASE A-1 PULLEY RIGHT INDEX FINGER    Allergies  Allergen Reactions   Amlodipine Other (See Comments)    Flush/ not really sure if this is the correct medication   Bextra [Valdecoxib] Hives   Morphine And Related     Changed personality    Objective/Physical Exam Neurovascular status intact.  Incision well coapted with sutures intact. No sign of infectious process noted. No dehiscence. No active bleeding noted.  Overall well-healing surgical toe   Assessment: 1. s/p excision of benign skin lesion  performed in office 01/04/2023.   -Patient evaluated. - Sutures removed -Patient may now discontinue the postsurgical shoe.  Recommend good supportive tennis shoes and sneakers that do not constrict the toebox area -Return to clinic as needed  Edrick Kins, DPM [facility]  Dr. Edrick Kins, DPM    [address], [place] [address]                Office [phone]  Fax [phone]

## 2023-02-01 ENCOUNTER — Encounter: Payer: Medicare Other | Admitting: Podiatry

## 2023-04-27 ENCOUNTER — Other Ambulatory Visit (HOSPITAL_COMMUNITY): Payer: Self-pay | Admitting: Internal Medicine

## 2023-04-27 DIAGNOSIS — R413 Other amnesia: Secondary | ICD-10-CM

## 2023-06-12 ENCOUNTER — Ambulatory Visit (HOSPITAL_COMMUNITY)
Admission: RE | Admit: 2023-06-12 | Discharge: 2023-06-12 | Disposition: A | Discharge status: Home / Self Care | Payer: Medicare Other | Source: Ambulatory Visit | Attending: Internal Medicine | Admitting: Internal Medicine

## 2023-06-12 DIAGNOSIS — R413 Other amnesia: Secondary | ICD-10-CM | POA: Diagnosis not present

## 2023-06-12 MED ORDER — GADOBUTROL 1 MMOL/ML IV SOLN
6.5000 mL | Freq: Once | INTRAVENOUS | Status: AC | PRN
  Administered 2023-06-12: 6.5 mL via INTRAVENOUS

## 2023-06-12 MED ORDER — GADOBUTROL 1 MMOL/ML IV SOLN: 7.0000 mL | Freq: Once | INTRAVENOUS | Status: DC | PRN

## 2023-07-16 ENCOUNTER — Emergency Department (HOSPITAL_COMMUNITY): Payer: Medicare Other

## 2023-07-16 ENCOUNTER — Inpatient Hospital Stay (HOSPITAL_COMMUNITY)
Admission: EM | Admit: 2023-07-16 | Discharge: 2023-07-18 | DRG: 641 | Disposition: A | Discharge status: Home / Self Care | Payer: Medicare Other | Attending: Family Medicine | Admitting: Family Medicine

## 2023-07-16 ENCOUNTER — Other Ambulatory Visit: Payer: Self-pay

## 2023-07-16 ENCOUNTER — Encounter (HOSPITAL_COMMUNITY): Payer: Self-pay

## 2023-07-16 DIAGNOSIS — F0394 Unspecified dementia, unspecified severity, with anxiety: Secondary | ICD-10-CM | POA: Diagnosis present

## 2023-07-16 DIAGNOSIS — K219 Gastro-esophageal reflux disease without esophagitis: Secondary | ICD-10-CM | POA: Diagnosis present

## 2023-07-16 DIAGNOSIS — R531 Weakness: Secondary | ICD-10-CM

## 2023-07-16 DIAGNOSIS — E871 Hypo-osmolality and hyponatremia: Secondary | ICD-10-CM | POA: Diagnosis not present

## 2023-07-16 DIAGNOSIS — I10 Essential (primary) hypertension: Secondary | ICD-10-CM | POA: Diagnosis present

## 2023-07-16 DIAGNOSIS — E876 Hypokalemia: Secondary | ICD-10-CM | POA: Diagnosis present

## 2023-07-16 DIAGNOSIS — Z7982 Long term (current) use of aspirin: Secondary | ICD-10-CM

## 2023-07-16 DIAGNOSIS — Z1152 Encounter for screening for COVID-19: Secondary | ICD-10-CM

## 2023-07-16 DIAGNOSIS — Z8616 Personal history of COVID-19: Secondary | ICD-10-CM

## 2023-07-16 DIAGNOSIS — F419 Anxiety disorder, unspecified: Secondary | ICD-10-CM | POA: Diagnosis not present

## 2023-07-16 DIAGNOSIS — E785 Hyperlipidemia, unspecified: Secondary | ICD-10-CM | POA: Diagnosis present

## 2023-07-16 DIAGNOSIS — Z888 Allergy status to other drugs, medicaments and biological substances status: Secondary | ICD-10-CM

## 2023-07-16 DIAGNOSIS — E7800 Pure hypercholesterolemia, unspecified: Secondary | ICD-10-CM

## 2023-07-16 DIAGNOSIS — R7303 Prediabetes: Secondary | ICD-10-CM | POA: Diagnosis present

## 2023-07-16 DIAGNOSIS — Z79899 Other long term (current) drug therapy: Secondary | ICD-10-CM

## 2023-07-16 DIAGNOSIS — Z66 Do not resuscitate: Secondary | ICD-10-CM | POA: Diagnosis present

## 2023-07-16 DIAGNOSIS — R1032 Left lower quadrant pain: Secondary | ICD-10-CM

## 2023-07-16 DIAGNOSIS — Z885 Allergy status to narcotic agent status: Secondary | ICD-10-CM

## 2023-07-16 LAB — COMPREHENSIVE METABOLIC PANEL
ALT: 17 U/L (ref 0–44)
AST: 21 U/L (ref 15–41)
Albumin: 4.4 g/dL (ref 3.5–5.0)
Alkaline Phosphatase: 48 U/L (ref 38–126)
Anion gap: 12 (ref 5–15)
BUN: 12 mg/dL (ref 8–23)
CO2: 21 mmol/L — ABNORMAL LOW (ref 22–32)
Calcium: 9.4 mg/dL (ref 8.9–10.3)
Chloride: 90 mmol/L — ABNORMAL LOW (ref 98–111)
Creatinine, Ser: 0.56 mg/dL (ref 0.44–1.00)
GFR, Estimated: >60 mL/min (ref >60)
Glucose, Bld: 125 mg/dL — ABNORMAL HIGH (ref 70–99)
Potassium: 3 mmol/L — ABNORMAL LOW (ref 3.5–5.1)
Sodium: 123 mmol/L — ABNORMAL LOW (ref 135–145)
Total Bilirubin: 1.4 mg/dL — ABNORMAL HIGH (ref 0.3–1.2)
Total Protein: 7.5 g/dL (ref 6.5–8.1)

## 2023-07-16 LAB — CBC WITH DIFFERENTIAL/PLATELET
Abs Immature Granulocytes: 0.01 10*3/uL (ref 0.00–0.07)
Basophils Absolute: 0 10*3/uL (ref 0.0–0.1)
Basophils Relative: 0 %
Eosinophils Absolute: 0 10*3/uL (ref 0.0–0.5)
Eosinophils Relative: 1 %
HCT: 34.4 % — ABNORMAL LOW (ref 36.0–46.0)
Hemoglobin: 12.1 g/dL (ref 12.0–15.0)
Immature Granulocytes: 0 %
Lymphocytes Relative: 24 %
Lymphs Abs: 1.3 10*3/uL (ref 0.7–4.0)
MCH: 30.9 pg (ref 26.0–34.0)
MCHC: 35.2 g/dL (ref 30.0–36.0)
MCV: 88 fL (ref 80.0–100.0)
Monocytes Absolute: 0.6 10*3/uL (ref 0.1–1.0)
Monocytes Relative: 10 %
Neutro Abs: 3.5 10*3/uL (ref 1.7–7.7)
Neutrophils Relative %: 65 %
Platelets: 192 10*3/uL (ref 150–400)
RBC: 3.91 MIL/uL (ref 3.87–5.11)
RDW: 13.1 % (ref 11.5–15.5)
WBC: 5.4 10*3/uL (ref 4.0–10.5)
nRBC: 0 % (ref 0.0–0.2)

## 2023-07-16 LAB — SARS CORONAVIRUS 2 BY RT PCR: SARS Coronavirus 2 by RT PCR: NEGATIVE

## 2023-07-16 LAB — URINALYSIS, ROUTINE W REFLEX MICROSCOPIC
Bilirubin Urine: NEGATIVE
Glucose, UA: NEGATIVE mg/dL
Hgb urine dipstick: NEGATIVE
Ketones, ur: 5 mg/dL — AB
Leukocytes,Ua: NEGATIVE
Nitrite: NEGATIVE
Protein, ur: NEGATIVE mg/dL
Specific Gravity, Urine: 1.012 (ref 1.005–1.030)
pH: 8 (ref 5.0–8.0)

## 2023-07-16 LAB — LIPASE, BLOOD: Lipase: 26 U/L (ref 11–51)

## 2023-07-16 MED ORDER — ATENOLOL 25 MG PO TABS
12.5000 mg | ORAL_TABLET | Freq: Two times a day (BID) | ORAL | Status: DC
  Administered 2023-07-16 – 2023-07-18 (×4): 12.5 mg via ORAL
  Filled 2023-07-16 (×4): qty 1

## 2023-07-16 MED ORDER — ONDANSETRON HCL 4 MG/2ML IJ SOLN: 4.0000 mg | Freq: Four times a day (QID) | INTRAMUSCULAR | Status: DC | PRN

## 2023-07-16 MED ORDER — SODIUM CHLORIDE 0.9 % IV BOLUS
500.0000 mL | Freq: Once | INTRAVENOUS | Status: AC
  Administered 2023-07-16: 500 mL via INTRAVENOUS

## 2023-07-16 MED ORDER — ACETAMINOPHEN 325 MG PO TABS
650.0000 mg | ORAL_TABLET | Freq: Four times a day (QID) | ORAL | Status: DC | PRN
  Administered 2023-07-16 – 2023-07-17 (×2): 650 mg via ORAL
  Filled 2023-07-16 (×2): qty 2

## 2023-07-16 MED ORDER — ONDANSETRON HCL 4 MG PO TABS: 4.0000 mg | ORAL_TABLET | Freq: Four times a day (QID) | ORAL | Status: DC | PRN

## 2023-07-16 MED ORDER — ONDANSETRON HCL 4 MG/2ML IJ SOLN
4.0000 mg | Freq: Once | INTRAMUSCULAR | Status: AC
  Administered 2023-07-16: 4 mg via INTRAVENOUS
  Filled 2023-07-16: qty 2

## 2023-07-16 MED ORDER — ACETAMINOPHEN 650 MG RE SUPP: 650.0000 mg | Freq: Four times a day (QID) | RECTAL | Status: DC | PRN

## 2023-07-16 MED ORDER — OXYCODONE HCL 5 MG PO TABS: 5.0000 mg | ORAL_TABLET | ORAL | Status: DC | PRN

## 2023-07-16 MED ORDER — ENSURE ENLIVE PO LIQD
237.0000 mL | Freq: Two times a day (BID) | ORAL | Status: DC
  Administered 2023-07-17 – 2023-07-18 (×2): 237 mL via ORAL

## 2023-07-16 MED ORDER — PANTOPRAZOLE SODIUM 40 MG PO TBEC
40.0000 mg | DELAYED_RELEASE_TABLET | Freq: Every day | ORAL | Status: DC
  Administered 2023-07-16 – 2023-07-18 (×3): 40 mg via ORAL
  Filled 2023-07-16 (×3): qty 1

## 2023-07-16 MED ORDER — SIMVASTATIN 20 MG PO TABS
20.0000 mg | ORAL_TABLET | Freq: Every evening | ORAL | Status: DC
  Administered 2023-07-16 – 2023-07-17 (×2): 20 mg via ORAL
  Filled 2023-07-16 (×2): qty 1

## 2023-07-16 MED ORDER — POTASSIUM CHLORIDE IN NACL 40-0.9 MEQ/L-% IV SOLN
INTRAVENOUS | Status: DC
  Filled 2023-07-16 (×2): qty 1000

## 2023-07-16 MED ORDER — FAMOTIDINE 20 MG PO TABS
10.0000 mg | ORAL_TABLET | Freq: Every day | ORAL | Status: DC
  Administered 2023-07-16 – 2023-07-17 (×2): 10 mg via ORAL
  Filled 2023-07-16 (×2): qty 1

## 2023-07-16 MED ORDER — POTASSIUM CHLORIDE CRYS ER 20 MEQ PO TBCR
40.0000 meq | EXTENDED_RELEASE_TABLET | Freq: Once | ORAL | Status: AC
  Administered 2023-07-16: 40 meq via ORAL
  Filled 2023-07-16: qty 2

## 2023-07-16 MED ORDER — IRBESARTAN 150 MG PO TABS
300.0000 mg | ORAL_TABLET | Freq: Every day | ORAL | Status: DC
  Administered 2023-07-17 – 2023-07-18 (×2): 300 mg via ORAL
  Filled 2023-07-16 (×2): qty 2

## 2023-07-16 MED ORDER — LORAZEPAM 0.5 MG PO TABS
0.2500 mg | ORAL_TABLET | Freq: Every day | ORAL | Status: DC | PRN
  Administered 2023-07-18: 0.25 mg via ORAL
  Filled 2023-07-16: qty 1

## 2023-07-16 MED ORDER — MORPHINE SULFATE (PF) 2 MG/ML IV SOLN: 2.0000 mg | INTRAVENOUS | Status: DC | PRN

## 2023-07-16 MED ORDER — HEPARIN SODIUM (PORCINE) 5000 UNIT/ML IJ SOLN
5000.0000 [IU] | Freq: Three times a day (TID) | INTRAMUSCULAR | Status: DC
  Administered 2023-07-16 – 2023-07-18 (×5): 5000 [IU] via SUBCUTANEOUS
  Filled 2023-07-16 (×5): qty 1

## 2023-07-16 MED ORDER — POTASSIUM CHLORIDE CRYS ER 20 MEQ PO TBCR
20.0000 meq | EXTENDED_RELEASE_TABLET | Freq: Every morning | ORAL | Status: DC
  Administered 2023-07-17 – 2023-07-18 (×2): 20 meq via ORAL
  Filled 2023-07-16 (×2): qty 1

## 2023-07-16 MED ORDER — IOHEXOL 300 MG/ML  SOLN
100.0000 mL | Freq: Once | INTRAMUSCULAR | Status: AC | PRN
  Administered 2023-07-16: 100 mL via INTRAVENOUS

## 2023-07-16 MED ORDER — ASPIRIN 81 MG PO TBEC
81.0000 mg | DELAYED_RELEASE_TABLET | Freq: Every evening | ORAL | Status: DC
  Administered 2023-07-16 – 2023-07-17 (×2): 81 mg via ORAL
  Filled 2023-07-16 (×2): qty 1

## 2023-07-16 NOTE — Assessment & Plan Note (Addendum)
-   Hypokalemia at 3.0 - Continue NS plus potassium - Secondary to poor p.o. intake and thiazide like diuretic - Trend with a.m. labs

## 2023-07-16 NOTE — ED Notes (Signed)
Pt is able to eat.

## 2023-07-16 NOTE — Assessment & Plan Note (Signed)
-   PT eval and treat - Likely secondary to poor p.o. intake/electrolyte derangements - Check TSH - Continue to monitor

## 2023-07-16 NOTE — Assessment & Plan Note (Signed)
-   Continue Pepcid and Protonix

## 2023-07-16 NOTE — H&P (Signed)
History and Physical    Patient: Terri Mccall:[id number] DOB: [birth year] DOA: 07/16/2023 DOS: the patient was seen and examined on 07/16/2023 PCP: Carylon Perches, MD  Patient coming from: Home  Chief Complaint:  Chief Complaint  Patient presents with   Abdominal Pain   Nausea   HPI: Terri Mccall is a 89 y.o. female with medical history significant of GERD, hyperlipidemia, hypertension, early dementia, and more presents the ED with a chief complaint of nausea and vomiting.  Husband is at bedside.  He reports that patient had a bad night last night and to prevent having to come in the middle of the night tonight they came to the hospital during the day today.  She has been having nausea.  She reports the nausea is worse when she goes to lay down at night.  She has had 1 episode of vomiting and it was clear.  She has had poor p.o. intake for almost a year.  Husband reports that her general decline in activity, p.o. intake, and memory started when she had COVID almost a year ago.  In order to increase her appetite she was started on Zoloft.  She took Zoloft for 2 days and did not take it on the third day because it made her nausea so much worse.  Patient reports she also has left lower quadrant pain.  It feels like a pressure or like something is in there.  It is likely related to stool.  She reports that the pain is worse before bowel movements and better after bowel movements.  Husband reports that the pain is also worse when going from laying to sitting or from sitting to standing.  Patient reports her last bowel movement was yesterday and it was normal for her.  Her last normal meal was a week ago.  Since then she is just snacked.  She denies any fever.  She denies any dysuria, hematuria, melena, hematochezia.  Patient denies any other complaints at this time.  Patient does not smoke and does not drink.  Patient reports that she would not want to be put on life support, or coded if her heart were  to stop beating.  She would prefer to have a natural death if that were to happen. Review of Systems: As mentioned in the history of present illness. All other systems reviewed and are negative. Past Medical History:  Diagnosis Date   Arthritis    Carpal tunnel syndrome of left wrist    Constipation    GERD (gastroesophageal reflux disease)    Hyperlipidemia    Hypertension    Pre-diabetes    Past Surgical History:  Procedure Laterality Date   ABDOMINAL HYSTERECTOMY     APPENDECTOMY     BACK SURGERY  1998   CARPAL TUNNEL RELEASE Left 03/06/2018   Procedure: LEFT CARPAL TUNNEL RELEASE;  Surgeon: Cindee Salt, MD;  Location: [facility];  Service: Orthopedics;  Laterality: Left;   CHOLECYSTECTOMY N/A 09/27/2016   Procedure: LAPAROSCOPIC CHOLECYSTECTOMY;  Surgeon: Ancil Linsey, MD;  [facility];  Service: General;  Laterality: N/A;   COLONOSCOPY  04/26/2012   Procedure: COLONOSCOPY;  Surgeon: Malissa Hippo, MD;  [facility];  Service: Endoscopy;  Laterality: N/A;  830   TONSILLECTOMY     TRIGGER FINGER RELEASE  11/20/2012   Procedure: RELEASE TRIGGER FINGER/A-1 PULLEY;  Surgeon: Nicki Reaper, MD;  Location: [facility];  Service: Orthopedics;  Laterality: Right;  RELEASE A-1  PULLEY RIGHT INDEX FINGER   Social History:  reports that she has never smoked. She has never used smokeless tobacco. She reports current alcohol use. She reports that she does not use drugs.  Allergies  Allergen Reactions   Amlodipine Other (See Comments)    Flush/ not really sure if this is the correct medication   Bextra [Valdecoxib] Hives   Morphine And Codeine     Changed personality   Sertraline Nausea And Vomiting    History reviewed. No pertinent family history.  Prior to Admission medications   Medication Sig Start Date End Date Taking? Authorizing Provider  aspirin EC 81 MG tablet Take 81 mg by mouth every evening.   Yes [provider]   atenolol (TENORMIN) 12.5 mg TABS tablet Take 12.5 mg by mouth 2 (two) times daily. 04/02/19  Yes [provider]  beta carotene w/minerals (OCUVITE) tablet Take 1 tablet by mouth every morning.   Yes [provider]  chlorthalidone (HYGROTON) 25 MG tablet Take 25 mg by mouth daily.   Yes [provider]  docusate sodium (COLACE) 100 MG capsule Take 1 capsule (100 mg total) by mouth 2 (two) times daily. 09/17/21  Yes Tressie Stalker, MD  famotidine (PEPCID) 20 MG tablet Take 1 tablet (20 mg total) by mouth 2 (two) times daily. Patient taking differently: Take 10 mg by mouth at bedtime. 06/10/20  Yes Nyoka Cowden, MD  fluticasone (FLONASE) 50 MCG/ACT nasal spray Place 2 sprays into both nostrils daily.   Yes [provider]  glucosamine-chondroitin 500-400 MG tablet Take 2 tablets by mouth daily.    Yes [provider]  hydrocortisone (ANUSOL-HC) 2.5 % rectal cream Place 1 application rectally daily as needed for hemorrhoids. 06/28/21  Yes [provider]  LORazepam (ATIVAN) 0.5 MG tablet Take 0.25 mg by mouth daily as needed for anxiety. 06/05/18  Yes [provider]  olmesartan (BENICAR) 40 MG tablet Take 40 mg by mouth every morning.   Yes [provider]  pantoprazole (PROTONIX) 40 MG tablet TAKE 1 TABLET BY MOUTH ONCE DAILY 30 TO 60 MINUTES BEFORE FIRST MEAL OF THE DAY. 11/17/21  Yes Nyoka Cowden, MD  Polyethyl Glycol-Propyl Glycol (SYSTANE) 0.4-0.3 % GEL ophthalmic gel Place 1 application into both eyes at bedtime.   Yes [provider]  Polyethyl Glycol-Propyl Glycol (SYSTANE) 0.4-0.3 % SOLN Place 1 drop into both eyes daily as needed (Dry eye).   Yes [provider]  potassium chloride SA (K-DUR,KLOR-CON) 20 MEQ tablet Take 20 mEq by mouth every morning.   Yes [provider]  sertraline (ZOLOFT) 50 MG tablet Take 50 mg by mouth daily. 07/13/23  Yes [provider]  simvastatin (ZOCOR)  20 MG tablet Take 20 mg by mouth every evening.   Yes [provider]  timolol (TIMOPTIC) 0.5 % ophthalmic solution Place 1 drop into the right eye every morning. 12/27/22  Yes [provider]  TRUE METRIX BLOOD GLUCOSE TEST test strip daily. 01/09/23   [provider]    Physical Exam: Vitals:   07/16/23 1845 07/16/23 1954 07/16/23 1956 07/16/23 1958  BP:  (!) 178/66 (!) 175/79   Pulse:  71 75   Resp:  18 16   Temp: 98.6 F (37 C) 98 F (36.7 C) 98 F (36.7 C)   TempSrc: Oral Axillary Oral   SpO2:  98% 98% 98%  Weight:      Height:       1.  General:  Patient lying supine in bed,  no acute distress   2. Psychiatric: Alert and oriented x person, place, context, mood and behavior normal for situation, pleasant and cooperative with exam   3. Neurologic: Speech and language are normal, face is symmetric, moves all 4 extremities voluntarily, at baseline without acute deficits on limited exam   4. HEENMT:  Head is atraumatic, normocephalic, pupils reactive to light, neck is supple, trachea is midline, mucous membranes are moist   5. Respiratory : Lungs are clear to auscultation bilaterally without wheezing, rhonchi, rales, no cyanosis, no increase in work of breathing or accessory muscle use   6. Cardiovascular : Heart rate normal, rhythm is regular, no murmurs, rubs or gallops, no peripheral edema, peripheral pulses palpated   7. Gastrointestinal:  Abdomen is soft, nondistended, nontender to palpation bowel sounds active, no masses or organomegaly palpated   8. Skin:  Skin is warm, dry and intact without rashes, acute lesions, or ulcers on limited exam   9.Musculoskeletal:  No acute deformities or trauma, no asymmetry in tone, no peripheral edema, peripheral pulses palpated, tenderness to palpation of the right malleolus without any evidence of trauma Data Reviewed: In the ED Temp 98.7, heart rate 75-86, respiratory rate 18, blood pressure  131/81-151/82, satting at 96-99% No leukocytosis with a white blood cell count of 5.4, hemoglobin 12.1, platelets 192 Sodium 123, potassium 3.0, chloride 90, bicarb 21 COVID-negative UA pending CT abdomen pelvis shows 3.4 cm simple appearing cyst in the right pelvis posterior to the urinary bladder-will likely need outpatient follow-up Patient was given 40 mEq of p.o. potassium and a 500 mL bolus of normal saline Admission requested for hyponatremia  Assessment and Plan: * Hyponatremia - Sodium 123 - Baseline seems to be 125-130 - Secondary to poor p.o. intake and thiazide like diuretic - Continue IV fluids - Trend at midnight and again in the a.m. - Symptoms include generalized weakness, some confusion - Had recently been started on SSRI, but it was so recent that it is not likely contributing to hyponatremia - Continue to monitor  Anxiety - Continue Ativan  Generalized weakness - PT eval and treat - Likely secondary to poor p.o. intake/electrolyte derangements - Check TSH - Continue to monitor  GERD (gastroesophageal reflux disease) - Continue Pepcid and Protonix  Hypokalemia - Hypokalemia at 3.0 - Continue NS plus potassium - Secondary to poor p.o. intake and thiazide like diuretic - Trend with a.m. labs  Hyperlipidemia - Continue simvastatin  Hypertension - Continue olmesartan Norvasc atenolol - Hold chlorthalidone in the setting of hyponatremia and hypokalemia - Continue to monitor      Advance Care Planning:   Code Status: Limited: Do not attempt resuscitation (DNR) -DNR-LIMITED -Do Not Intubate/DNI    Consults: none at this time  Family Communication: husband at bedside  Severity of Illness: The appropriate patient status for this patient is OBSERVATION. Observation status is judged to be reasonable and necessary in order to provide the required intensity of service to ensure the patient's safety. The patient's presenting symptoms, physical exam  findings, and initial radiographic and laboratory data in the context of their medical condition is felt to place them at decreased risk for further clinical deterioration. Furthermore, it is anticipated that the patient will be medically stable for discharge from the hospital within 2 midnights of admission.   Author: Lilyan Gilford, DO 07/16/2023 8:54 PM  For on call review [email or web address].

## 2023-07-16 NOTE — Assessment & Plan Note (Addendum)
-   Sodium 123 - Baseline seems to be 125-130 - Secondary to poor p.o. intake and thiazide like diuretic - Continue IV fluids - Trend at midnight and again in the a.m. - Symptoms include generalized weakness, some confusion - Had recently been started on SSRI, but it was so recent that it is not likely contributing to hyponatremia - Continue to monitor

## 2023-07-16 NOTE — Assessment & Plan Note (Signed)
-   Continue Ativan

## 2023-07-16 NOTE — ED Triage Notes (Signed)
Pt c/o intermittent lower abdominal pain and nausea x "a while."  Pt and husband report symptoms have gotten worse over the past 3-4 days.  Currently denies pain.  Sts pain increases w/ movement.     Husband reports Pt was started on Sertraline x2 days ago.

## 2023-07-16 NOTE — Assessment & Plan Note (Signed)
Continue simvastatin. 

## 2023-07-16 NOTE — Assessment & Plan Note (Signed)
-   Continue olmesartan Norvasc atenolol - Hold chlorthalidone in the setting of hyponatremia and hypokalemia - Continue to monitor

## 2023-07-16 NOTE — ED Provider Notes (Signed)
[place] EMERGENCY DEPARTMENT AT [facility] Provider Note   CSN: [id number] Arrival date & time: 07/16/23  1215     History  Chief Complaint  Patient presents with   Abdominal Pain   Nausea    Terri Mccall is a 89 y.o. female with a history including hypertension, hyperlipidemia, GERD, prediabetes, history of constipation, surgical history significant for abdominal hysterectomy and appendectomy, cholecystectomy presenting with a 2-week history of left lower quadrant abdominal pain along with morning nausea, reduced appetite generalized weakness with escalation of pain and nausea over the past 2 days.  She was seen by her primary provider and started on sertraline 3 days ago after which she noticed increased nausea throughout the day rather than just early morning so stopped this medicine today.  She has had reduced appetite, she denies shortness of breath, chest pain, fevers, diarrhea.  She does have a history of constipation, her last bowel movement was yesterday morning, perhaps had a small movement again this morning but does not recall, she states this does not improve her pain.  Pain is worsened with movement and better at rest.  She has taken several doses of Pepto-Bismol which has been briefly helpful.  The history is provided by the patient and the spouse.       Home Medications Prior to Admission medications   Medication Sig Start Date End Date Taking? Authorizing Provider  amLODipine (NORVASC) 2.5 MG tablet  05/28/18   [provider]  aspirin (ASPIRIN 81) 81 MG EC tablet 1 tablet    [provider]  aspirin EC 81 MG tablet Take 81 mg by mouth every evening.    [provider]  atenolol (TENORMIN) 12.5 mg TABS tablet Take 12.5 mg by mouth 2 (two) times daily. 04/02/19   [provider]  azithromycin (ZITHROMAX) 250 MG tablet 2 tablet  on the first day, then 1 tablet daily for 4 days Orally Once a day for 5 day(s) 03/09/21    [provider]  beta carotene w/minerals (OCUVITE) tablet Take 1 tablet by mouth every morning.    [provider]  Calcium Carb-Cholecalciferol 600-5 MG-MCG TABS 1 tablet    [provider]  Calcium Carbonate-Vit D-Min (CALCIUM 600+D PLUS MINERALS PO) Take 1 tablet by mouth daily.    [provider]  cefdinir (OMNICEF) 300 MG capsule Take 300 mg by mouth 2 (two) times daily. 01/06/23   [provider]  cetirizine (ZYRTEC) 10 MG tablet Take 10 mg by mouth daily.    [provider]  cetirizine (ZYRTEC) 10 MG tablet 1 tablet    [provider]  chlorthalidone (HYGROTON) 25 MG tablet Take 25 mg by mouth daily.    [provider]  Cinnamon 500 MG capsule Take 500 mg by mouth daily.    [provider]  Cranberry 300 MG tablet See admin instructions.    [provider]  CRANBERRY PO Take 300 mg by mouth daily.    [provider]  docusate sodium (COLACE) 100 MG capsule Take 1 capsule (100 mg total) by mouth 2 (two) times daily. 09/17/21   Tressie Stalker, MD  famotidine (PEPCID) 20 MG tablet Take 1 tablet (20 mg total) by mouth 2 (two) times daily. Patient taking differently: Take 10 mg by mouth at bedtime. 06/10/20   Nyoka Cowden, MD  famotidine (PEPCID) 40 MG tablet 1 tablet at bedtime.    [provider]  fluticasone (FLONASE ALLERGY RELIEF) 50 MCG/ACT  nasal spray 1 spray in each nostril    [provider]  fluticasone (FLONASE) 50 MCG/ACT nasal spray Place 2 sprays into both nostrils daily.    [provider]  Gluc-Chonn-MSM-Boswellia-Vit D (GLUCOSAMINE CHOND TRIPLE/VIT D) TABS See admin instructions.    [provider]  glucosamine-chondroitin 500-400 MG tablet Take 2 tablets by mouth daily.     [provider]  hydrocortisone (ANUSOL-HC) 2.5 % rectal cream Place 1 application rectally daily as needed for hemorrhoids. 06/28/21   [provider]   latanoprost (XALATAN) 0.005 % ophthalmic solution Place 1 drop into both eyes at bedtime. 07/16/13   [provider]  LORazepam (ATIVAN) 0.5 MG tablet Take 0.25 mg by mouth daily as needed for anxiety. 06/05/18   [provider]  LORazepam (ATIVAN) 0.5 MG tablet 1 tablet as needed    [provider]  nitrofurantoin (MACRODANTIN) 50 MG capsule Take 50 mg by mouth daily. 11/08/22   [provider]  nitrofurantoin, macrocrystal-monohydrate, (MACROBID) 100 MG capsule Take by mouth. 12/13/22   [provider]  olmesartan (BENICAR) 40 MG tablet Take 40 mg by mouth every morning.    [provider]  ondansetron (ZOFRAN) 4 MG tablet Take 4 mg by mouth. 09/08/22   [provider]  pantoprazole (PROTONIX) 40 MG tablet TAKE 1 TABLET BY MOUTH ONCE DAILY 30 TO 60 MINUTES BEFORE FIRST MEAL OF THE DAY. 11/17/21   Nyoka Cowden, MD  pantoprazole (PROTONIX) 40 MG tablet 1 tablet    [provider]  PAXLOVID, 300/100, 20 x 150 MG & 10 x 100MG  TBPK Take by mouth as directed. 08/26/22   [provider]  Polyethyl Glycol-Propyl Glycol (SYSTANE) 0.4-0.3 % GEL ophthalmic gel Place 1 application into both eyes at bedtime.    [provider]  Polyethyl Glycol-Propyl Glycol (SYSTANE) 0.4-0.3 % SOLN Place 1 drop into both eyes daily as needed (Dry eye).    [provider]  potassium chloride SA (K-DUR,KLOR-CON) 20 MEQ tablet Take 20 mEq by mouth every morning.    [provider]  predniSONE (DELTASONE) 20 MG tablet 1 tablet Orally Once a day for 5 days    [provider]  PREMARIN vaginal cream Place 1 Applicatorful vaginally once a week. 06/24/13   [provider]  simvastatin (ZOCOR) 20 MG tablet Take 20 mg by mouth every evening.    [provider]  timolol (BETIMOL) 0.25 % ophthalmic solution Place 1 drop into the right eye daily.    [provider]  timolol (TIMOPTIC) 0.5 % ophthalmic  solution Place 1 drop into the right eye every morning. 12/27/22   [provider]  TRUE METRIX BLOOD GLUCOSE TEST test strip daily. 01/09/23   [provider]      Allergies    Amlodipine, Bextra [valdecoxib], and Morphine and codeine    Review of Systems   Review of Systems  Constitutional:  Positive for appetite change. Negative for fever.  HENT:  Negative for congestion and sore throat.   Eyes: Negative.   Respiratory:  Negative for chest tightness and shortness of breath.   Cardiovascular:  Negative for chest pain.  Gastrointestinal:  Positive for abdominal pain, constipation and nausea. Negative for vomiting.  Genitourinary: Negative.   Musculoskeletal:  Negative for arthralgias, joint swelling and neck pain.  Skin: Negative.  Negative for rash and wound.  Neurological:  Negative for dizziness, weakness, light-headedness, numbness and headaches.  Psychiatric/Behavioral: Negative.      Physical  Exam Updated Vital Signs BP 131/81 (BP Location: Right Arm)   Pulse 75   Temp 98.7 F (37.1 C) (Oral)   Resp 18   Ht 5' (1.524 m)   Wt 59.4 kg   SpO2 99%   BMI 25.58 kg/m  Physical Exam Vitals and nursing note reviewed.  Constitutional:      Appearance: She is well-developed.  HENT:     Head: Normocephalic and atraumatic.  Eyes:     Conjunctiva/sclera: Conjunctivae normal.  Cardiovascular:     Rate and Rhythm: Normal rate and regular rhythm.     Heart sounds: Normal heart sounds.  Pulmonary:     Effort: Pulmonary effort is normal.     Breath sounds: Normal breath sounds. No wheezing.  Abdominal:     General: Bowel sounds are normal. There is distension.     Palpations: Abdomen is soft.     Tenderness: There is abdominal tenderness in the left lower quadrant. There is no guarding or rebound.     Comments: Soft distention without guarding.  Normoactive bowel sounds.  Musculoskeletal:        General: Normal range of motion.     Cervical back: Normal range  of motion.  Skin:    General: Skin is warm and dry.  Neurological:     Mental Status: She is alert.     ED Results / Procedures / Treatments   Labs (all labs ordered are listed, but only abnormal results are displayed) Labs Reviewed  COMPREHENSIVE METABOLIC PANEL - Abnormal; Notable for the following components:      Result Value   Sodium 123 (*)    Potassium 3.0 (*)    Chloride 90 (*)    CO2 21 (*)    Glucose, Bld 125 (*)    Total Bilirubin 1.4 (*)    All other components within normal limits  URINALYSIS, ROUTINE W REFLEX MICROSCOPIC - Abnormal; Notable for the following components:   Color, Urine COLORLESS (*)    Ketones, ur 5 (*)    All other components within normal limits  CBC WITH DIFFERENTIAL/PLATELET - Abnormal; Notable for the following components:   HCT 34.4 (*)    All other components within normal limits  SARS CORONAVIRUS 2 BY RT PCR  LIPASE, BLOOD  CBC WITH DIFFERENTIAL/PLATELET    EKG None  Radiology CT ABDOMEN PELVIS W CONTRAST  Result Date: 07/16/2023 CLINICAL DATA:  Intermittent left lower quadrant abdominal pain. EXAM: CT ABDOMEN AND PELVIS WITH CONTRAST TECHNIQUE: Multidetector CT imaging of the abdomen and pelvis was performed using the standard protocol following bolus administration of intravenous contrast. RADIATION DOSE REDUCTION: This exam was performed according to the departmental dose-optimization program which includes automated exposure control, adjustment of the mA and/or kV according to patient size and/or use of iterative reconstruction technique. CONTRAST:  OMNIPAQUE IOHEXOL 300 MG/ML  SOLN COMPARISON:  September 27, 2016 FINDINGS: Lower chest: Incompletely visualized atelectasis versus airspace disease in the right middle lobe and lingula. Hepatobiliary: No focal liver abnormality is seen. Status post cholecystectomy. No biliary dilatation. Pancreas: Unremarkable. No pancreatic ductal dilatation or surrounding inflammatory changes. Spleen:  Normal in size without focal abnormality. Adrenals/Urinary Tract: Adrenal glands are unremarkable. Kidneys are normal, without renal calculi, focal lesion, or hydronephrosis. Bladder is unremarkable. Stomach/Bowel: Stomach is within normal limits. No evidence of bowel wall thickening, distention, or inflammatory changes. Vascular/Lymphatic: Aortic atherosclerosis. No enlarged abdominal or pelvic lymph nodes. Reproductive: Status post hysterectomy. No adnexal masses. 3.4 cm simple appearing cyst  in the right pelvis, posterior to the urinary bladder, may represent right ovarian cyst. This finding was not present in 2017. Other: No abdominal wall hernia or abnormality. No abdominopelvic ascites. Musculoskeletal: L4-S5 posterior fusion. IMPRESSION: 1. No acute abnormality identified within the abdomen or pelvis. 2. Incompletely visualized atelectasis versus airspace disease in the right middle lobe and lingula. 3. 3.4 cm simple appearing cyst in the right pelvis, posterior to the urinary bladder, may represent right ovarian cyst. Further evaluation with pelvic ultrasound may be considered non emergently. 4. Aortic atherosclerosis. Aortic Atherosclerosis (ICD10-I70.0). Electronically Signed   By: Ted Mcalpine M.D.   On: 07/16/2023 16:09    Procedures Procedures    Medications Ordered in ED Medications  sodium chloride 0.9 % bolus 500 mL (has no administration in time range)  sodium chloride 0.9 % bolus 500 mL (0 mLs Intravenous Stopped 07/16/23 1608)  ondansetron (ZOFRAN) injection 4 mg (4 mg Intravenous Given 07/16/23 1507)  potassium chloride SA (KLOR-CON M) CR tablet 40 mEq (40 mEq Oral Given 07/16/23 1616)  iohexol (OMNIPAQUE) 300 MG/ML solution 100 mL (100 mLs Intravenous Contrast Given 07/16/23 1527)    ED Course/ Medical Decision Making/ A&P                                 Medical Decision Making Patient presenting with left lower quadrant abdominal pain along with nausea, reduced appetite and  generalized fatigue which has been present for about 2 weeks but worse over the past several days.  Differential diagnosis including small bowel obstruction, diverticulitis, UTI, urinary tract pathology such as kidney stone, mass, constipation.  Amount and/or Complexity of Data Reviewed Labs: ordered.    Details: Patient's labs unremarkable, urine is negative, she got a normal WBC count of 5.4, negative COVID, normal lipase.  Her c-Met is concerning for a hyponatremia and hypokalemia.  Her BUN and creatinine are normal, creatinine is 0.56 Radiology: ordered.    Details: CT imaging abdomen pelvis reviewed, no acute abnormalities.  She does have what appears to be a simple cyst in her right lower pelvis, probable benign ovarian cyst, nonemergent pelvic ultrasound recommended. Discussion of management or test interpretation with external provider(s): Discussed with Dr. Carren Rang who accepts pt for admission.  Risk Decision regarding hospitalization.           Final Clinical Impression(s) / ED Diagnoses Final diagnoses:  Hyponatremia  Hypokalemia  Left lower quadrant abdominal pain    Rx / DC Orders ED Discharge Orders     None         Victoriano Lain 07/16/23 1752    Kommor, Wyn Forster, MD 07/18/23 [phone]

## 2023-07-17 DIAGNOSIS — F0394 Unspecified dementia, unspecified severity, with anxiety: Secondary | ICD-10-CM | POA: Diagnosis present

## 2023-07-17 DIAGNOSIS — R7303 Prediabetes: Secondary | ICD-10-CM | POA: Diagnosis present

## 2023-07-17 DIAGNOSIS — E785 Hyperlipidemia, unspecified: Secondary | ICD-10-CM | POA: Diagnosis present

## 2023-07-17 DIAGNOSIS — Z79899 Other long term (current) drug therapy: Secondary | ICD-10-CM | POA: Diagnosis not present

## 2023-07-17 DIAGNOSIS — K219 Gastro-esophageal reflux disease without esophagitis: Secondary | ICD-10-CM | POA: Diagnosis present

## 2023-07-17 DIAGNOSIS — E871 Hypo-osmolality and hyponatremia: Secondary | ICD-10-CM | POA: Diagnosis present

## 2023-07-17 DIAGNOSIS — Z7982 Long term (current) use of aspirin: Secondary | ICD-10-CM | POA: Diagnosis not present

## 2023-07-17 DIAGNOSIS — Z885 Allergy status to narcotic agent status: Secondary | ICD-10-CM | POA: Diagnosis not present

## 2023-07-17 DIAGNOSIS — Z1152 Encounter for screening for COVID-19: Secondary | ICD-10-CM | POA: Diagnosis not present

## 2023-07-17 DIAGNOSIS — Z66 Do not resuscitate: Secondary | ICD-10-CM | POA: Diagnosis present

## 2023-07-17 DIAGNOSIS — I10 Essential (primary) hypertension: Secondary | ICD-10-CM | POA: Diagnosis present

## 2023-07-17 DIAGNOSIS — Z8616 Personal history of COVID-19: Secondary | ICD-10-CM | POA: Diagnosis not present

## 2023-07-17 DIAGNOSIS — Z888 Allergy status to other drugs, medicaments and biological substances status: Secondary | ICD-10-CM | POA: Diagnosis not present

## 2023-07-17 DIAGNOSIS — E876 Hypokalemia: Secondary | ICD-10-CM | POA: Diagnosis present

## 2023-07-17 LAB — COMPREHENSIVE METABOLIC PANEL
ALT: 17 U/L (ref 0–44)
AST: 20 U/L (ref 15–41)
Albumin: 4.1 g/dL (ref 3.5–5.0)
Alkaline Phosphatase: 44 U/L (ref 38–126)
Anion gap: 8 (ref 5–15)
BUN: 9 mg/dL (ref 8–23)
CO2: 22 mmol/L (ref 22–32)
Calcium: 9 mg/dL (ref 8.9–10.3)
Chloride: 98 mmol/L (ref 98–111)
Creatinine, Ser: 0.56 mg/dL (ref 0.44–1.00)
GFR, Estimated: >60 mL/min (ref >60)
Glucose, Bld: 106 mg/dL — ABNORMAL HIGH (ref 70–99)
Potassium: 3.2 mmol/L — ABNORMAL LOW (ref 3.5–5.1)
Sodium: 128 mmol/L — ABNORMAL LOW (ref 135–145)
Total Bilirubin: 1.4 mg/dL — ABNORMAL HIGH (ref 0.3–1.2)
Total Protein: 6.9 g/dL (ref 6.5–8.1)

## 2023-07-17 LAB — CBC WITH DIFFERENTIAL/PLATELET
Abs Immature Granulocytes: 0.02 10*3/uL (ref 0.00–0.07)
Basophils Absolute: 0 10*3/uL (ref 0.0–0.1)
Basophils Relative: 0 %
Eosinophils Absolute: 0 10*3/uL (ref 0.0–0.5)
Eosinophils Relative: 0 %
HCT: 34.3 % — ABNORMAL LOW (ref 36.0–46.0)
Hemoglobin: 11.7 g/dL — ABNORMAL LOW (ref 12.0–15.0)
Immature Granulocytes: 0 %
Lymphocytes Relative: 30 %
Lymphs Abs: 2.1 10*3/uL (ref 0.7–4.0)
MCH: 30.6 pg (ref 26.0–34.0)
MCHC: 34.1 g/dL (ref 30.0–36.0)
MCV: 89.8 fL (ref 80.0–100.0)
Monocytes Absolute: 1 10*3/uL (ref 0.1–1.0)
Monocytes Relative: 14 %
Neutro Abs: 3.8 10*3/uL (ref 1.7–7.7)
Neutrophils Relative %: 56 %
Platelets: 193 10*3/uL (ref 150–400)
RBC: 3.82 MIL/uL — ABNORMAL LOW (ref 3.87–5.11)
RDW: 13.2 % (ref 11.5–15.5)
WBC: 7 10*3/uL (ref 4.0–10.5)
nRBC: 0 % (ref 0.0–0.2)

## 2023-07-17 LAB — TSH: TSH: 3.352 u[IU]/mL (ref 0.350–4.500)

## 2023-07-17 LAB — SODIUM, URINE, RANDOM: Sodium, Ur: 100 mmol/L

## 2023-07-17 LAB — MAGNESIUM: Magnesium: 1.6 mg/dL — ABNORMAL LOW (ref 1.7–2.4)

## 2023-07-17 LAB — CREATININE, URINE, RANDOM: Creatinine, Urine: 41 mg/dL

## 2023-07-17 MED ORDER — POTASSIUM CHLORIDE CRYS ER 20 MEQ PO TBCR
40.0000 meq | EXTENDED_RELEASE_TABLET | Freq: Once | ORAL | Status: AC
  Administered 2023-07-17: 40 meq via ORAL
  Filled 2023-07-17: qty 2

## 2023-07-17 NOTE — Plan of Care (Signed)
  Problem: Acute Rehab PT Goals(only PT should resolve) Goal: Pt Will Go Supine/Side To Sit Outcome: Progressing Flowsheets (Taken 07/17/2023 1216) Pt will go Supine/Side to Sit:  Independently  with modified independence Goal: Patient Will Transfer Sit To/From Stand Outcome: Progressing Flowsheets (Taken 07/17/2023 1216) Patient will transfer sit to/from stand:  Independently  with modified independence Goal: Pt Will Transfer Bed To Chair/Chair To Bed Outcome: Progressing Flowsheets (Taken 07/17/2023 1216) Pt will Transfer Bed to Chair/Chair to Bed:  with modified independence  with supervision Goal: Pt Will Ambulate Outcome: Progressing Flowsheets (Taken 07/17/2023 1216) Pt will Ambulate:  > 125 feet  with modified independence  with supervision  with rolling walker   12:17 PM, 07/17/23 Ocie Bob, MPT Physical Therapist with [facility] 336 [phone] office [phone] mobile phone

## 2023-07-17 NOTE — Evaluation (Signed)
Physical Therapy Evaluation Patient Details Name: Terri Mccall MRN: [id number] DOB: 23-Aug-1934 Today's Date: 07/17/2023  History of Present Illness  Terri Mccall is a 89 y.o. female with medical history significant of GERD, hyperlipidemia, hypertension, early dementia, and more presents the ED with a chief complaint of nausea and vomiting.  Husband is at bedside.  He reports that patient had a bad night last night and to prevent having to come in the middle of the night tonight they came to the hospital during the day today.  She has been having nausea.  She reports the nausea is worse when she goes to lay down at night.  She has had 1 episode of vomiting and it was clear.  She has had poor p.o. intake for almost a year.  Husband reports that her general decline in activity, p.o. intake, and memory started when she had COVID almost a year ago.  In order to increase her appetite she was started on Zoloft.  She took Zoloft for 2 days and did not take it on the third day because it made her nausea so much worse.  Patient reports she also has left lower quadrant pain.  It feels like a pressure or like something is in there.  It is likely related to stool.  She reports that the pain is worse before bowel movements and better after bowel movements.  Husband reports that the pain is also worse when going from laying to sitting or from sitting to standing.  Patient reports her last bowel movement was yesterday and it was normal for her.  Her last normal meal was a week ago.  Since then she is just snacked.  She denies any fever.  She denies any dysuria, hematuria, melena, hematochezia.  Patient denies any other complaints at this time.   Clinical Impression  Patient demonstrates good return for bed mobility, transferring to from commode in bathroom and chair at bedside, has to occasionally lean on nearby objects for support when taking steps without AD, safer using RW with good carryover for ambulating in  room/hallway without loss of balance.  Patient tolerated sitting up in chair after therapy. Patient will benefit from continued skilled physical therapy in hospital and recommended venue below to increase strength, balance, endurance for safe ADLs and gait.          If plan is discharge home, recommend the following: A little help with walking and/or transfers;A little help with bathing/dressing/bathroom;Help with stairs or ramp for entrance;Assistance with cooking/housework   Can travel by private vehicle        Equipment Recommendations None recommended by PT  Recommendations for Other Services       Functional Status Assessment Patient has had a recent decline in their functional status and demonstrates the ability to make significant improvements in function in a reasonable and predictable amount of time.     Precautions / Restrictions Precautions Precautions: Fall Restrictions Weight Bearing Restrictions: No      Mobility  Bed Mobility Overal bed mobility: Modified Independent                  Transfers Overall transfer level: Needs assistance   Transfers: Sit to/from Stand, Bed to chair/wheelchair/BSC Sit to Stand: Supervision   Step pivot transfers: Supervision, Contact guard assist       General transfer comment: slightly labored movement    Ambulation/Gait Ambulation/Gait assistance: Supervision, Contact guard assist Gait Distance (Feet): 80 Feet Assistive device: Rolling walker (2 wheels)  Gait Pattern/deviations: Decreased step length - right, Decreased step length - left, Decreased stride length Gait velocity: decreased     General Gait Details: slow slightly labored cadence without loss of balance, limited mostly due to fatigue  Stairs            Wheelchair Mobility     Tilt Bed    Modified Rankin (Stroke Patients Only)       Balance Overall balance assessment: Needs assistance Sitting-balance support: Feet supported, No  upper extremity supported Sitting balance-Leahy Scale: Good Sitting balance - Comments: seated at EOB   Standing balance support: During functional activity, No upper extremity supported Standing balance-Leahy Scale: Fair Standing balance comment: fair/good using RW                             Pertinent Vitals/Pain Pain Assessment Pain Assessment: No/denies pain    Home Living Family/patient expects to be discharged to:: Private residence Living Arrangements: Spouse/significant other Available Help at Discharge: Family;Available 24 hours/day Type of Home: House Home Access: Stairs to enter Entrance Stairs-Rails: Left Entrance Stairs-Number of Steps: 2   Home Layout: Two level;Able to live on main level with bedroom/bathroom;Full bath on main level Home Equipment: Rolling Walker (2 wheels);Grab bars - tub/shower;Cane - single point      Prior Function Prior Level of Function : Needs assist       Physical Assist : Mobility (physical);ADLs (physical) Mobility (physical): Bed mobility;Transfers;Gait;Stairs   Mobility Comments: household ambulator without AD, uses RW for longer distances ADLs Comments: Assisted by family     Extremity/Trunk Assessment   Upper Extremity Assessment Upper Extremity Assessment: Overall WFL for tasks assessed    Lower Extremity Assessment Lower Extremity Assessment: Overall WFL for tasks assessed    Cervical / Trunk Assessment Cervical / Trunk Assessment: Normal  Communication   Communication Communication: No apparent difficulties  Cognition Arousal: Alert Behavior During Therapy: WFL for tasks assessed/performed Overall Cognitive Status: Within Functional Limits for tasks assessed                                          General Comments      Exercises     Assessment/Plan    PT Assessment Patient needs continued PT services  PT Problem List Decreased strength;Decreased activity  tolerance;Decreased balance;Decreased mobility       PT Treatment Interventions DME instruction;Gait training;Stair training;Functional mobility training;Therapeutic activities;Therapeutic exercise;Balance training;Patient/family education    PT Goals (Current goals can be found in the Care Plan section)  Acute Rehab PT Goals Patient Stated Goal: return home with family to assist PT Goal Formulation: With patient Time For Goal Achievement: 07/20/23 Potential to Achieve Goals: Good    Frequency Min 2X/week     Co-evaluation               AM-PAC PT "6 Clicks" Mobility  Outcome Measure Help needed turning from your back to your side while in a flat bed without using bedrails?: None Help needed moving from lying on your back to sitting on the side of a flat bed without using bedrails?: None Help needed moving to and from a bed to a chair (including a wheelchair)?: A Little Help needed standing up from a chair using your arms (e.g., wheelchair or bedside chair)?: A Little Help needed to walk in hospital room?: A  Little Help needed climbing 3-5 steps with a railing? : A Little 6 Click Score: 20    End of Session   Activity Tolerance: Patient tolerated treatment well;Patient limited by fatigue Patient left: in chair;with call bell/phone within reach Nurse Communication: Mobility status PT Visit Diagnosis: Unsteadiness on feet (R26.81);Other abnormalities of gait and mobility (R26.89);Muscle weakness (generalized) (M62.81)    Time: 1610-9604 PT Time Calculation (min) (ACUTE ONLY): 20 min   Charges:   PT Evaluation $PT Eval Moderate Complexity: 1 Mod PT Treatments $Therapeutic Activity: 8-22 mins PT General Charges $$ ACUTE PT VISIT: 1 Visit         12:16 PM, 07/17/23 Ocie Bob, MPT Physical Therapist with [facility] [phone] office [phone] mobile phone

## 2023-07-17 NOTE — Progress Notes (Signed)
Pt lives with husband and is independent with ADLs. PT evaluated pt and no follow up needed. TOC will follow.    07/17/23 1001  TOC Brief Assessment  Insurance and Status Reviewed  Patient has primary care physician Yes  Home environment has been reviewed Lives with husband.  Prior level of function: Independent.  Prior/Current Home Services No current home services  Social Determinants of Health Reivew SDOH reviewed no interventions necessary  Readmission risk has been reviewed Yes  Transition of care needs no transition of care needs at this time

## 2023-07-17 NOTE — Progress Notes (Signed)
PROGRESS NOTE   Terri Mccall, is a 89 y.o. female, DOB - September 28, 1934, ZOX:[id number]  Admit date - 07/16/2023   Admitting Physician Lilyan Gilford, DO  Outpatient Primary MD for the patient is Carylon Perches, MD  LOS - 0  Chief Complaint  Patient presents with   Abdominal Pain   Nausea      Brief Narrative:  89 y.o. female with medical history significant of GERD, hyperlipidemia, hypertension, early dementia admitted on 07/16/2023 with generalized weakness and confusion and found to have hyponatremia and hypokalemia in the setting of poor oral intake as well as nausea vomiting    -Assessment and Plan: 1) acute on chronic symptomatic hyponatremia - Sodium 123 on admission - Baseline seems to be 125-130 - Secondary to poor p.o. intake, emesis and  chlorthalidone use -Stop Zoloft -Stop chlorthalidone -Hydrate IV and orally  2) hypokalemia--- due to GI losses and chlorthalidone use- -replace and recheck  3)HTN--- okay to continue Avapro and atenolol  4)GERD--continue Protonix  5)HLD--continue simvastatin  6)Generalized weakness - Due to #1 above  PT eval appreciated, recommends no PT follow-up  7)Anxiety disorder - Continue Ativan prn -Stop Zoloft as husband reports worsening nausea with Zoloft   Status is: Inpatient   Disposition: The patient is from: Home              Anticipated d/c is to: Home              Anticipated d/c date is: 1 day              Patient currently is not medically stable to d/c. Barriers: Not Clinically Stable-   Code Status :  -  Code Status: Limited: Do not attempt resuscitation (DNR) -DNR-LIMITED -Do Not Intubate/DNI    Family Communication:   Husband is primary contact  DVT Prophylaxis  :   - SCDs   heparin injection 5,000 Units Start: 07/16/23 2200 SCDs Start: 07/16/23 1958   Lab Results  Component Value Date   PLT 193 07/17/2023    Inpatient Medications  Scheduled Meds:  aspirin EC  81 mg Oral QPM   atenolol  12.5 mg Oral  BID   famotidine  10 mg Oral QHS   feeding supplement  237 mL Oral BID BM   heparin  5,000 Units Subcutaneous Q8H   irbesartan  300 mg Oral Daily   pantoprazole  40 mg Oral Daily   potassium chloride SA  20 mEq Oral q morning   potassium chloride  40 mEq Oral Once   simvastatin  20 mg Oral QPM   Continuous Infusions:  0.9 % NaCl with KCl 40 mEq / L 75 mL/hr at 07/17/23 1048   PRN Meds:.acetaminophen **OR** acetaminophen, LORazepam, morphine injection, ondansetron **OR** ondansetron (ZOFRAN) IV, oxyCODONE   Anti-infectives (From admission, onward)    None       Subjective: Terri Mccall today has no fevers, no further emesis,  No chest pain,  - Oral intake is not great -Fatigue and generalized weakness persist -Husband at bedside, questions answered Had BM x 2 over last 12 hrs or so  Objective: Vitals:   07/16/23 1958 07/17/23 0001 07/17/23 0003 07/17/23 0436  BP:  (!) 153/133 (!) 158/76 (!) 162/62  Pulse:  69 71 67  Resp:  16 (!) 24 16  Temp:  98.2 F (36.8 C)  97.9 F (36.6 C)  TempSrc:  Oral  Oral  SpO2: 98% 93%  96%  Weight:      Height:  Intake/Output Summary (Last 24 hours) at 07/17/2023 1244 Last data filed at 07/17/2023 0851 Gross per 24 hour  Intake 1131.63 ml  Output --  Net 1131.63 ml   Filed Weights   07/16/23 1238 07/16/23 1956  Weight: 59.4 kg 57.7 kg    Physical Exam  Gen:- Awake Alert,  in no apparent distress  HEENT:- [place].AT, No sclera icterus Neck-Supple Neck,No JVD,.  Lungs-  CTAB , fair symmetrical air movement CV- S1, S2 normal, regular  Abd-  +ve B.Sounds, Abd Soft, No tenderness,    Extremity/Skin:- No  edema, pedal pulses present  Psych-affect is appropriate, oriented x3 Neuro-no new focal deficits, no tremors  Data Reviewed: I have personally reviewed following labs and imaging studies  CBC: Recent Labs  Lab 07/16/23 1359 07/17/23 0220  WBC 5.4 7.0  NEUTROABS 3.5 3.8  HGB 12.1 11.7*  HCT 34.4* 34.3*  MCV 88.0  89.8  PLT 192 193   Basic Metabolic Panel: Recent Labs  Lab 07/16/23 1330 07/17/23 0220  NA 123* 128*  K 3.0* 3.2*  CL 90* 98  CO2 21* 22  GLUCOSE 125* 106*  BUN 12 9  CREATININE 0.56 0.56  CALCIUM 9.4 9.0  MG  --  1.6*   GFR: Estimated Creatinine Clearance: 39.4 mL/min (by C-G formula based on SCr of 0.56 mg/dL). Liver Function Tests: Recent Labs  Lab 07/16/23 1330 07/17/23 0220  AST 21 20  ALT 17 17  ALKPHOS 48 44  BILITOT 1.4* 1.4*  PROT 7.5 6.9  ALBUMIN 4.4 4.1   Recent Results (from the past 240 hour(s))  SARS Coronavirus 2 by RT PCR (hospital order, performed in [facility] hospital lab) *cepheid single result test* Anterior Nasal Swab     Status: None   Collection Time: 07/16/23  3:10 PM   Specimen: Anterior Nasal Swab  Result Value Ref Range Status   SARS Coronavirus 2 by RT PCR NEGATIVE NEGATIVE Final    Comment: (NOTE) SARS-CoV-2 target nucleic acids are NOT DETECTED.  The SARS-CoV-2 RNA is generally detectable in upper and lower respiratory specimens during the acute phase of infection. The lowest concentration of SARS-CoV-2 viral copies this assay can detect is 250 copies / mL. A negative result does not preclude SARS-CoV-2 infection and should not be used as the sole basis for treatment or other patient management decisions.  A negative result may occur with improper specimen collection / handling, submission of specimen other than nasopharyngeal swab, presence of viral mutation(s) within the areas targeted by this assay, and inadequate number of viral copies (<250 copies / mL). A negative result must be combined with clinical observations, patient history, and epidemiological information.  Fact Sheet for Patients:   RoadLapTop.co.za  Fact Sheet for Healthcare Providers: http://kim-miller.com/  This test is not yet approved or  cleared by the [place] FDA and has been authorized for detection  and/or diagnosis of SARS-CoV-2 by FDA under an Emergency Use Authorization (EUA).  This EUA will remain in effect (meaning this test can be used) for the duration of the COVID-19 declaration under Section 564(b)(1) of the Act, 21 U.S.C. section 360bbb-3(b)(1), unless the authorization is terminated or revoked sooner.  Performed at [facility], [address] [place], [place] [address]     Radiology Studies: CT ABDOMEN PELVIS W CONTRAST  Result Date: 07/16/2023 CLINICAL DATA:  Intermittent left lower quadrant abdominal pain. EXAM: CT ABDOMEN AND PELVIS WITH CONTRAST TECHNIQUE: Multidetector CT imaging of the abdomen and pelvis was performed using the standard  protocol following bolus administration of intravenous contrast. RADIATION DOSE REDUCTION: This exam was performed according to the departmental dose-optimization program which includes automated exposure control, adjustment of the mA and/or kV according to patient size and/or use of iterative reconstruction technique. CONTRAST:  OMNIPAQUE IOHEXOL 300 MG/ML  SOLN COMPARISON:  September 27, 2016 FINDINGS: Lower chest: Incompletely visualized atelectasis versus airspace disease in the right middle lobe and lingula. Hepatobiliary: No focal liver abnormality is seen. Status post cholecystectomy. No biliary dilatation. Pancreas: Unremarkable. No pancreatic ductal dilatation or surrounding inflammatory changes. Spleen: Normal in size without focal abnormality. Adrenals/Urinary Tract: Adrenal glands are unremarkable. Kidneys are normal, without renal calculi, focal lesion, or hydronephrosis. Bladder is unremarkable. Stomach/Bowel: Stomach is within normal limits. No evidence of bowel wall thickening, distention, or inflammatory changes. Vascular/Lymphatic: Aortic atherosclerosis. No enlarged abdominal or pelvic lymph nodes. Reproductive: Status post hysterectomy. No adnexal masses. 3.4 cm simple appearing cyst in the right pelvis, posterior to the  urinary bladder, may represent right ovarian cyst. This finding was not present in 2017. Other: No abdominal wall hernia or abnormality. No abdominopelvic ascites. Musculoskeletal: L4-S5 posterior fusion. IMPRESSION: 1. No acute abnormality identified within the abdomen or pelvis. 2. Incompletely visualized atelectasis versus airspace disease in the right middle lobe and lingula. 3. 3.4 cm simple appearing cyst in the right pelvis, posterior to the urinary bladder, may represent right ovarian cyst. Further evaluation with pelvic ultrasound may be considered non emergently. 4. Aortic atherosclerosis. Aortic Atherosclerosis (ICD10-I70.0). Electronically Signed   By: Ted Mcalpine M.D.   On: 07/16/2023 16:09    Scheduled Meds:  aspirin EC  81 mg Oral QPM   atenolol  12.5 mg Oral BID   famotidine  10 mg Oral QHS   feeding supplement  237 mL Oral BID BM   heparin  5,000 Units Subcutaneous Q8H   irbesartan  300 mg Oral Daily   pantoprazole  40 mg Oral Daily   potassium chloride SA  20 mEq Oral q morning   potassium chloride  40 mEq Oral Once   simvastatin  20 mg Oral QPM   Continuous Infusions:  0.9 % NaCl with KCl 40 mEq / L 75 mL/hr at 07/17/23 1048    LOS: 0 days    Shon Hale M.D on 07/17/2023 at 12:44 PM  Go to [email or web address] - for contact info  [facility] - Office  [phone]  If 7PM-7AM, please contact night-coverage [email or web address] 07/17/2023, 12:44 PM

## 2023-07-18 DIAGNOSIS — E871 Hypo-osmolality and hyponatremia: Secondary | ICD-10-CM | POA: Diagnosis not present

## 2023-07-18 LAB — RENAL FUNCTION PANEL
Albumin: 4.1 g/dL (ref 3.5–5.0)
Anion gap: 8 (ref 5–15)
BUN: 8 mg/dL (ref 8–23)
CO2: 23 mmol/L (ref 22–32)
Calcium: 8.9 mg/dL (ref 8.9–10.3)
Chloride: 97 mmol/L — ABNORMAL LOW (ref 98–111)
Creatinine, Ser: 0.5 mg/dL (ref 0.44–1.00)
GFR, Estimated: >60 mL/min (ref >60)
Glucose, Bld: 114 mg/dL — ABNORMAL HIGH (ref 70–99)
Phosphorus: 2.3 mg/dL — ABNORMAL LOW (ref 2.5–4.6)
Potassium: 3.7 mmol/L (ref 3.5–5.1)
Sodium: 128 mmol/L — ABNORMAL LOW (ref 135–145)

## 2023-07-18 NOTE — Discharge Summary (Signed)
Terri Mccall, is a 89 y.o. female  DOB Feb 28, 1934  MRN [id number].  Admission date:  07/16/2023  Admitting Physician  Shon Hale, MD  Discharge Date:  07/18/2023   Primary MD  Carylon Perches, MD  Recommendations for primary care physician for things to follow:   1)Possible Right  OVarian Cyst--your CAT scan shows possible ovarian cyst on the right consider getting a pelvic ultrasound as an outpatient after further discussion with the primary care physician 2) please stop taking chlorthalidone due to low sodium and low potassium 3) please stop taking Zoloft/sertraline due to side effects 4) okay to use Metamucil/fiber supplements as advised for constipation concerns 5)Repeat BMP blood test within a week advised  Admission Diagnosis  Hypokalemia [E87.6] Hyponatremia [E87.1] Left lower quadrant abdominal pain [R10.32]   Discharge Diagnosis  Hypokalemia [E87.6] Hyponatremia [E87.1] Left lower quadrant abdominal pain [R10.32]   Principal Problem:   Hyponatremia Active Problems:   Hypertension   Hyperlipidemia   Hypokalemia   GERD (gastroesophageal reflux disease)   Generalized weakness   Anxiety      Past Medical History:  Diagnosis Date   Arthritis    Carpal tunnel syndrome of left wrist    Constipation    GERD (gastroesophageal reflux disease)    Hyperlipidemia    Hypertension    Pre-diabetes     Past Surgical History:  Procedure Laterality Date   ABDOMINAL HYSTERECTOMY     APPENDECTOMY     BACK SURGERY  1998   CARPAL TUNNEL RELEASE Left 03/06/2018   Procedure: LEFT CARPAL TUNNEL RELEASE;  Surgeon: Cindee Salt, MD;  Location: [facility];  Service: Orthopedics;  Laterality: Left;   CHOLECYSTECTOMY N/A 09/27/2016   Procedure: LAPAROSCOPIC CHOLECYSTECTOMY;  Surgeon: Ancil Linsey, MD;  Location: [facility];  Service: General;  Laterality: N/A;   COLONOSCOPY  04/26/2012    Procedure: COLONOSCOPY;  Surgeon: Malissa Hippo, MD;  Location: [facility];  Service: Endoscopy;  Laterality: N/A;  830   TONSILLECTOMY     TRIGGER FINGER RELEASE  11/20/2012   Procedure: RELEASE TRIGGER FINGER/A-1 PULLEY;  Surgeon: Nicki Reaper, MD;  Location: [facility];  Service: Orthopedics;  Laterality: Right;  RELEASE A-1 PULLEY RIGHT INDEX FINGER     HPI  from the history and physical done on the day of admission:    HPI: Terri Mccall is a 89 y.o. female with medical history significant of GERD, hyperlipidemia, hypertension, early dementia, and more presents the ED with a chief complaint of nausea and vomiting.  Husband is at bedside.  He reports that patient had a bad night last night and to prevent having to come in the middle of the night tonight they came to the hospital during the day today.  She has been having nausea.  She reports the nausea is worse when she goes to lay down at night.  She has had 1 episode of vomiting and it was clear.  She has had poor p.o. intake for almost a year.  Husband reports that her general decline in activity, p.o. intake, and memory started when she had COVID almost a year ago.  In order to increase her appetite she was started on Zoloft.  She took Zoloft for 2 days and did not take it on the third day because it made her nausea so much worse.  Patient reports she also has left lower quadrant pain.  It feels like a pressure or like something is in there.  It is likely related to stool.  She reports that the pain is worse before bowel movements and better after bowel movements.  Husband reports that the pain is also worse when going from laying to sitting or from sitting to standing.  Patient reports her last bowel movement was yesterday and it was normal for her.  Her last normal meal was a week ago.  Since then she is just snacked.  She denies any fever.  She denies any dysuria, hematuria, melena, hematochezia.  Patient denies any other  complaints at this time.   Patient does not smoke and does not drink.   Patient reports that she would not want to be put on life support, or coded if her heart were to stop beating.  She would prefer to have a natural death if that were to happen. Review of Systems: As mentioned in the history of present illness. All other systems reviewed and are negative.     Hospital Course:     Brief Narrative:  89 y.o. female with medical history significant of GERD, hyperlipidemia, hypertension, early dementia admitted on 07/16/2023 with generalized weakness and confusion and found to have hyponatremia and hypokalemia in the setting of poor oral intake as well as nausea vomiting     -Assessment and Plan: 1) acute on chronic symptomatic hyponatremia - Sodium 123 on admission - Baseline seems to be 125-130 - Secondary to poor p.o. intake, emesis and  chlorthalidone use -Stopped Zoloft -Stopped chlorthalidone -Serum Sodium increased to 128 (which is baseline) Repeat BMP within 1 week   2) hypokalemia--- due to GI losses and chlorthalidone use- -replaced and normalized   3)HTN--- okay to continue olmesatan and atenolol   4)GERD--continue Protonix   5)HLD--continue simvastatin   6)Generalized weakness - Due to #1 above  PT eval appreciated, recommends no PT follow-up   7)Anxiety disorder - Continue Ativan prn -Stopped Zoloft as husband reports worsening nausea with Zoloft   Disposition: The patient is from: Home              Anticipated d/c is to: Home  Discharge Condition: stable  Follow UP   Follow-up Information     Carylon Perches, MD. Schedule an appointment as soon as possible for a visit in 1 week(s).   Specialty: Internal Medicine Why: Repeat BMP Blood Test Contact information: [address] [place] [address] [phone]                Diet and Activity recommendation:  As advised  Discharge Instructions    Discharge Instructions     Call MD  for:  difficulty breathing, headache or visual disturbances   Complete by: As directed    Call MD for:  persistant dizziness or light-headedness   Complete by: As directed    Call MD for:  persistant nausea and vomiting   Complete by: As directed    Call MD for:  temperature >100.4   Complete by: As directed    Diet general   Complete by: As directed  Discharge instructions   Complete by: As directed    1)Possible Right  OVarian Cyst--your CAT scan shows possible ovarian cyst on the right consider getting a pelvic ultrasound as an outpatient after further discussion with the primary care physician 2) please stop taking chlorthalidone due to low sodium and low potassium 3) please stop taking Zoloft/sertraline due to side effects 4) okay to use Metamucil/fiber supplements as advised for constipation concerns 5)Repeat BMP blood test within a week advised   Increase activity slowly   Complete by: As directed         Discharge Medications     Allergies as of 07/18/2023       Reactions   Amlodipine Other (See Comments)   Flush/ not really sure if this is the correct medication   Bextra [valdecoxib] Hives   Morphine And Codeine    Changed personality   Sertraline Nausea And Vomiting        Medication List     STOP taking these medications    chlorthalidone 25 MG tablet Commonly known as: HYGROTON   pantoprazole 40 MG tablet Commonly known as: PROTONIX   sertraline 50 MG tablet Commonly known as: ZOLOFT       TAKE these medications    aspirin EC 81 MG tablet Take 81 mg by mouth every evening.   atenolol 12.5 mg Tabs tablet Commonly known as: TENORMIN Take 12.5 mg by mouth 2 (two) times daily.   beta carotene w/minerals tablet Take 1 tablet by mouth every morning.   docusate sodium 100 MG capsule Commonly known as: COLACE Take 1 capsule (100 mg total) by mouth 2 (two) times daily.   famotidine 20 MG tablet Commonly known as: PEPCID Take 1 tablet (20  mg total) by mouth 2 (two) times daily. What changed:  how much to take when to take this   fluticasone 50 MCG/ACT nasal spray Commonly known as: FLONASE Place 2 sprays into both nostrils daily.   glucosamine-chondroitin 500-400 MG tablet Take 2 tablets by mouth daily.   hydrocortisone 2.5 % rectal cream Commonly known as: ANUSOL-HC Place 1 application rectally daily as needed for hemorrhoids.   LORazepam 0.5 MG tablet Commonly known as: ATIVAN Take 0.25 mg by mouth daily as needed for anxiety.   olmesartan 40 MG tablet Commonly known as: BENICAR Take 40 mg by mouth every morning.   potassium chloride SA 20 MEQ tablet Commonly known as: KLOR-CON M Take 20 mEq by mouth every morning.   simvastatin 20 MG tablet Commonly known as: ZOCOR Take 20 mg by mouth every evening.   Systane 0.4-0.3 % Gel ophthalmic gel Generic drug: Polyethyl Glycol-Propyl Glycol Place 1 application into both eyes at bedtime.   Systane 0.4-0.3 % Soln Generic drug: Polyethyl Glycol-Propyl Glycol Place 1 drop into both eyes daily as needed (Dry eye).   timolol 0.5 % ophthalmic solution Commonly known as: TIMOPTIC Place 1 drop into the right eye every morning.   True Metrix Blood Glucose Test test strip Generic drug: glucose blood daily.        Major procedures and Radiology Reports - PLEASE review detailed and final reports for all details, in brief -   CT ABDOMEN PELVIS W CONTRAST  Result Date: 07/16/2023 CLINICAL DATA:  Intermittent left lower quadrant abdominal pain. EXAM: CT ABDOMEN AND PELVIS WITH CONTRAST TECHNIQUE: Multidetector CT imaging of the abdomen and pelvis was performed using the standard protocol following bolus administration of intravenous contrast. RADIATION DOSE REDUCTION: This exam was performed according  to the departmental dose-optimization program which includes automated exposure control, adjustment of the mA and/or kV according to patient size and/or use of iterative  reconstruction technique. CONTRAST:  OMNIPAQUE IOHEXOL 300 MG/ML  SOLN COMPARISON:  September 27, 2016 FINDINGS: Lower chest: Incompletely visualized atelectasis versus airspace disease in the right middle lobe and lingula. Hepatobiliary: No focal liver abnormality is seen. Status post cholecystectomy. No biliary dilatation. Pancreas: Unremarkable. No pancreatic ductal dilatation or surrounding inflammatory changes. Spleen: Normal in size without focal abnormality. Adrenals/Urinary Tract: Adrenal glands are unremarkable. Kidneys are normal, without renal calculi, focal lesion, or hydronephrosis. Bladder is unremarkable. Stomach/Bowel: Stomach is within normal limits. No evidence of bowel wall thickening, distention, or inflammatory changes. Vascular/Lymphatic: Aortic atherosclerosis. No enlarged abdominal or pelvic lymph nodes. Reproductive: Status post hysterectomy. No adnexal masses. 3.4 cm simple appearing cyst in the right pelvis, posterior to the urinary bladder, may represent right ovarian cyst. This finding was not present in 2017. Other: No abdominal wall hernia or abnormality. No abdominopelvic ascites. Musculoskeletal: L4-S5 posterior fusion. IMPRESSION: 1. No acute abnormality identified within the abdomen or pelvis. 2. Incompletely visualized atelectasis versus airspace disease in the right middle lobe and lingula. 3. 3.4 cm simple appearing cyst in the right pelvis, posterior to the urinary bladder, may represent right ovarian cyst. Further evaluation with pelvic ultrasound may be considered non emergently. 4. Aortic atherosclerosis. Aortic Atherosclerosis (ICD10-I70.0). Electronically Signed   By: Ted Mcalpine M.D.   On: 07/16/2023 16:09    Micro Results   Recent Results (from the past 240 hour(s))  SARS Coronavirus 2 by RT PCR (hospital order, performed in [facility] hospital lab) *cepheid single result test* Anterior Nasal Swab     Status: None   Collection Time: 07/16/23  3:10 PM    Specimen: Anterior Nasal Swab  Result Value Ref Range Status   SARS Coronavirus 2 by RT PCR NEGATIVE NEGATIVE Final    Comment: (NOTE) SARS-CoV-2 target nucleic acids are NOT DETECTED.  The SARS-CoV-2 RNA is generally detectable in upper and lower respiratory specimens during the acute phase of infection. The lowest concentration of SARS-CoV-2 viral copies this assay can detect is 250 copies / mL. A negative result does not preclude SARS-CoV-2 infection and should not be used as the sole basis for treatment or other patient management decisions.  A negative result may occur with improper specimen collection / handling, submission of specimen other than nasopharyngeal swab, presence of viral mutation(s) within the areas targeted by this assay, and inadequate number of viral copies (<250 copies / mL). A negative result must be combined with clinical observations, patient history, and epidemiological information.  Fact Sheet for Patients:   RoadLapTop.co.za  Fact Sheet for Healthcare Providers: http://kim-miller.com/  This test is not yet approved or  cleared by the [place] FDA and has been authorized for detection and/or diagnosis of SARS-CoV-2 by FDA under an Emergency Use Authorization (EUA).  This EUA will remain in effect (meaning this test can be used) for the duration of the COVID-19 declaration under Section 564(b)(1) of the Act, 21 U.S.C. section 360bbb-3(b)(1), unless the authorization is terminated or revoked sooner.  Performed at [facility], [address] [place], [place] [address]     Today   Subjective    Lisbet Mariconda today has no new complaints  -Husband is at bedside and daughter who is a pharmacist is on the speaker phone  -Questions answered -Eating and drinking well -No emesis -No significant nausea at this time  Patient has been seen and examined prior to discharge   Objective   Blood  pressure (!) 174/81, pulse 90, temperature 98.1 F (36.7 C), temperature source Oral, resp. rate 20, height 5' (1.524 m), weight 57.7 kg, SpO2 98%.   Intake/Output Summary (Last 24 hours) at 07/18/2023 1329 Last data filed at 07/18/2023 1017 Gross per 24 hour  Intake 1457.4 ml  Output 1101 ml  Net 356.4 ml    Exam Gen:- Awake Alert, no acute distress  HEENT:- [place].AT, No sclera icterus Neck-Supple Neck,No JVD,.  Lungs-  CTAB , good air movement bilaterally CV- S1, S2 normal, regular Abd-  +ve B.Sounds, Abd Soft, No tenderness,    Extremity/Skin:- No  edema,   good pulses Psych-affect is appropriate, oriented x3, somewhat forgetful at times Neuro-no new focal deficits, no tremors    Data Review   CBC w Diff:  Lab Results  Component Value Date   WBC 7.0 07/17/2023   HGB 11.7 (L) 07/17/2023   HCT 34.3 (L) 07/17/2023   PLT 193 07/17/2023   LYMPHOPCT 30 07/17/2023   MONOPCT 14 07/17/2023   EOSPCT 0 07/17/2023   BASOPCT 0 07/17/2023    CMP:  Lab Results  Component Value Date   NA 128 (L) 07/18/2023   K 3.7 07/18/2023   CL 97 (L) 07/18/2023   CO2 23 07/18/2023   BUN 8 07/18/2023   CREATININE 0.50 07/18/2023   PROT 6.9 07/17/2023   ALBUMIN 4.1 07/18/2023   BILITOT 1.4 (H) 07/17/2023   ALKPHOS 44 07/17/2023   AST 20 07/17/2023   ALT 17 07/17/2023  .  Total Discharge time is about 33 minutes  Shon Hale M.D on 07/18/2023 at 1:29 PM  Go to [email or web address] -  for contact info  [facility] - Office  [phone]

## 2023-07-18 NOTE — Progress Notes (Signed)
Pt disscharged via WC to POV.

## 2023-07-18 NOTE — Discharge Instructions (Signed)
1)Possible Right  OVarian Cyst--your CAT scan shows possible ovarian cyst on the right consider getting a pelvic ultrasound as an outpatient after further discussion with the primary care physician 2) please stop taking chlorthalidone due to low sodium and low potassium 3) please stop taking Zoloft/sertraline due to side effects 4) okay to use Metamucil/fiber supplements as advised for constipation concerns 5)Repeat BMP blood test within a week advised

## 2023-07-18 NOTE — Plan of Care (Signed)

## 2023-07-18 NOTE — Progress Notes (Signed)
Mobility Specialist Progress Note:    07/18/23 1020  Mobility  Activity Ambulated with assistance to bathroom  Level of Assistance Other (Comment) (HHA)  Assistive Device None  Distance Ambulated (ft) 10 ft  Range of Motion/Exercises Active;All extremities  Activity Response Tolerated well  Mobility Referral Yes  $Mobility charge 1 Mobility  Mobility Specialist Start Time (ACUTE ONLY) 1020  Mobility Specialist Stop Time (ACUTE ONLY) 1030  Mobility Specialist Time Calculation (min) (ACUTE ONLY) 10 min   Pt received attempting to ambulate to BR with no assistance. Required HHA with no AD to ambulate. Tolerated well, asx throughout. Returned pt to chair, alarm on. Family in room, all needs met.   Lawerance Bach Mobility Specialist Please contact via Special educational needs teacher or  Rehab office at [phone]

## 2023-07-18 NOTE — Care Management Important Message (Signed)
Important Message  Patient Details  Name: Terri Mccall MRN: [id number] Date of Birth: October 11, 1934   Medicare Important Message Given:  N/A - LOS <3 / Initial given by admissions     Corey Harold 07/18/2023, 10:25 AM

## 2023-09-26 ENCOUNTER — Encounter (HOSPITAL_COMMUNITY): Payer: Self-pay | Admitting: Internal Medicine

## 2023-09-27 ENCOUNTER — Other Ambulatory Visit (HOSPITAL_COMMUNITY): Payer: Self-pay | Admitting: Internal Medicine

## 2023-09-27 DIAGNOSIS — N83201 Unspecified ovarian cyst, right side: Secondary | ICD-10-CM

## 2023-10-03 ENCOUNTER — Ambulatory Visit (HOSPITAL_COMMUNITY): Payer: Medicare Other

## 2023-10-09 ENCOUNTER — Ambulatory Visit (HOSPITAL_COMMUNITY)
Admission: RE | Admit: 2023-10-09 | Discharge: 2023-10-09 | Disposition: A | Discharge status: Home / Self Care | Payer: Medicare Other | Source: Ambulatory Visit | Attending: Internal Medicine | Admitting: Internal Medicine

## 2023-10-09 DIAGNOSIS — N83201 Unspecified ovarian cyst, right side: Secondary | ICD-10-CM | POA: Diagnosis present

## 2023-12-17 ENCOUNTER — Emergency Department (HOSPITAL_COMMUNITY): Payer: Medicare Other

## 2023-12-17 ENCOUNTER — Other Ambulatory Visit: Payer: Self-pay

## 2023-12-17 ENCOUNTER — Emergency Department (HOSPITAL_COMMUNITY)
Admission: EM | Admit: 2023-12-17 | Discharge: 2023-12-17 | Disposition: A | Discharge status: Home / Self Care | Payer: Medicare Other | Attending: Emergency Medicine | Admitting: Emergency Medicine

## 2023-12-17 ENCOUNTER — Encounter (HOSPITAL_COMMUNITY): Payer: Self-pay

## 2023-12-17 DIAGNOSIS — N83291 Other ovarian cyst, right side: Secondary | ICD-10-CM | POA: Diagnosis not present

## 2023-12-17 DIAGNOSIS — N3001 Acute cystitis with hematuria: Secondary | ICD-10-CM | POA: Insufficient documentation

## 2023-12-17 DIAGNOSIS — Z7982 Long term (current) use of aspirin: Secondary | ICD-10-CM | POA: Insufficient documentation

## 2023-12-17 DIAGNOSIS — N949 Unspecified condition associated with female genital organs and menstrual cycle: Secondary | ICD-10-CM

## 2023-12-17 DIAGNOSIS — R350 Frequency of micturition: Secondary | ICD-10-CM | POA: Diagnosis present

## 2023-12-17 DIAGNOSIS — N3000 Acute cystitis without hematuria: Secondary | ICD-10-CM

## 2023-12-17 LAB — URINALYSIS, ROUTINE W REFLEX MICROSCOPIC
Bacteria, UA: NONE SEEN
Bilirubin Urine: NEGATIVE
Glucose, UA: NEGATIVE mg/dL
Hgb urine dipstick: NEGATIVE
Ketones, ur: NEGATIVE mg/dL
Leukocytes,Ua: NEGATIVE
Nitrite: POSITIVE — AB
Protein, ur: 100 mg/dL — AB
Specific Gravity, Urine: 1.005 (ref 1.005–1.030)
pH: 7 (ref 5.0–8.0)

## 2023-12-17 LAB — CBC WITH DIFFERENTIAL/PLATELET
Abs Immature Granulocytes: 0.01 10*3/uL (ref 0.00–0.07)
Basophils Absolute: 0 10*3/uL (ref 0.0–0.1)
Basophils Relative: 0 %
Eosinophils Absolute: 0.2 10*3/uL (ref 0.0–0.5)
Eosinophils Relative: 3 %
HCT: 39.2 % (ref 36.0–46.0)
Hemoglobin: 13 g/dL (ref 12.0–15.0)
Immature Granulocytes: 0 %
Lymphocytes Relative: 21 %
Lymphs Abs: 1.1 10*3/uL (ref 0.7–4.0)
MCH: 30.4 pg (ref 26.0–34.0)
MCHC: 33.2 g/dL (ref 30.0–36.0)
MCV: 91.8 fL (ref 80.0–100.0)
Monocytes Absolute: 0.7 10*3/uL (ref 0.1–1.0)
Monocytes Relative: 13 %
Neutro Abs: 3.2 10*3/uL (ref 1.7–7.7)
Neutrophils Relative %: 63 %
Platelets: 199 10*3/uL (ref 150–400)
RBC: 4.27 MIL/uL (ref 3.87–5.11)
RDW: 13.1 % (ref 11.5–15.5)
WBC: 5.2 10*3/uL (ref 4.0–10.5)
nRBC: 0 % (ref 0.0–0.2)

## 2023-12-17 LAB — BASIC METABOLIC PANEL
Anion gap: 12 (ref 5–15)
BUN: 11 mg/dL (ref 8–23)
CO2: 26 mmol/L (ref 22–32)
Calcium: 10.3 mg/dL (ref 8.9–10.3)
Chloride: 96 mmol/L — ABNORMAL LOW (ref 98–111)
Creatinine, Ser: 0.61 mg/dL (ref 0.44–1.00)
GFR, Estimated: >60 mL/min (ref >60)
Glucose, Bld: 109 mg/dL — ABNORMAL HIGH (ref 70–99)
Potassium: 4.2 mmol/L (ref 3.5–5.1)
Sodium: 134 mmol/L — ABNORMAL LOW (ref 135–145)

## 2023-12-17 LAB — MAGNESIUM: Magnesium: 1.9 mg/dL (ref 1.7–2.4)

## 2023-12-17 MED ORDER — ONDANSETRON HCL 4 MG/2ML IJ SOLN
4.0000 mg | Freq: Once | INTRAMUSCULAR | Status: AC
  Administered 2023-12-17: 4 mg via INTRAVENOUS
  Filled 2023-12-17: qty 2

## 2023-12-17 MED ORDER — SODIUM CHLORIDE 0.9 % IV BOLUS
1000.0000 mL | Freq: Once | INTRAVENOUS | Status: AC
  Administered 2023-12-17: 1000 mL via INTRAVENOUS

## 2023-12-17 MED ORDER — CEPHALEXIN 500 MG PO CAPS: 500.0000 mg | ORAL_CAPSULE | Freq: Three times a day (TID) | ORAL | 0 refills | Status: AC

## 2023-12-17 NOTE — ED Provider Notes (Signed)
 [facility] EMERGENCY DEPARTMENT AT [facility] Provider Note   CSN: [id number] Arrival date & time: 12/17/23  9251     History  Chief Complaint  Patient presents with   Urinary Frequency    Terri Mccall is a 89 y.o. female.  She is here with a complaint of burning with urination and lower abdominal pain urinary frequency nausea that started yesterday.  No fever but she did have some chills.  Chronic cough.  She tried an Azo at home without any improvement.  The history is provided by the patient.  Urinary Frequency This is a new problem. The current episode started yesterday. The problem occurs constantly. The problem has not changed since onset.Associated symptoms include abdominal pain. Pertinent negatives include no chest pain, no headaches and no shortness of breath. Nothing aggravates the symptoms. Nothing relieves the symptoms. Treatments tried: azo. The treatment provided no relief.       Home Medications Prior to Admission medications   Medication Sig Start Date End Date Taking? Authorizing Provider  aspirin  EC 81 MG tablet Take 81 mg by mouth every evening.    [provider]  atenolol  (TENORMIN ) 12.5 mg TABS tablet Take 12.5 mg by mouth 2 (two) times daily. 04/02/19   [provider]  beta carotene w/minerals (OCUVITE) tablet Take 1 tablet by mouth every morning.    [provider]  docusate sodium  (COLACE) 100 MG capsule Take 1 capsule (100 mg total) by mouth 2 (two) times daily. 09/17/21   Mavis Purchase, MD  famotidine  (PEPCID ) 20 MG tablet Take 1 tablet (20 mg total) by mouth 2 (two) times daily. Patient taking differently: Take 10 mg by mouth at bedtime. 06/10/20   Darlean Ozell NOVAK, MD  fluticasone  (FLONASE ) 50 MCG/ACT nasal spray Place 2 sprays into both nostrils daily.    [provider]  glucosamine-chondroitin 500-400 MG tablet Take 2 tablets by mouth daily.     [provider]  hydrocortisone (ANUSOL-HC) 2.5  % rectal cream Place 1 application rectally daily as needed for hemorrhoids. 06/28/21   [provider]  LORazepam  (ATIVAN ) 0.5 MG tablet Take 0.25 mg by mouth daily as needed for anxiety. 06/05/18   [provider]  olmesartan (BENICAR) 40 MG tablet Take 40 mg by mouth every morning.    [provider]  Polyethyl Glycol-Propyl Glycol (SYSTANE) 0.4-0.3 % GEL ophthalmic gel Place 1 application into both eyes at bedtime.    [provider]  Polyethyl Glycol-Propyl Glycol (SYSTANE) 0.4-0.3 % SOLN Place 1 drop into both eyes daily as needed (Dry eye).    [provider]  potassium chloride  SA (K-DUR,KLOR-CON ) 20 MEQ tablet Take 20 mEq by mouth every morning.    [provider]  simvastatin  (ZOCOR ) 20 MG tablet Take 20 mg by mouth every evening.    [provider]  timolol  (TIMOPTIC ) 0.5 % ophthalmic solution Place 1 drop into the right eye every morning. 12/27/22   [provider]  TRUE METRIX BLOOD GLUCOSE TEST test strip daily. 01/09/23   [provider]      Allergies    Amlodipine, Bextra [valdecoxib], Morphine  and codeine, and Sertraline    Review of Systems   Review of Systems  Constitutional:  Positive for chills. Negative for fever.  Respiratory:  Negative for shortness of breath.   Cardiovascular:  Negative for chest pain.  Gastrointestinal:  Positive for abdominal pain and nausea. Negative for vomiting.  Genitourinary:  Positive for dysuria and frequency.  Musculoskeletal:  Negative for back pain.  Neurological:  Negative for headaches.    Physical Exam Updated Vital Signs BP 131/83 (BP Location: Right Arm)   Pulse 61   Temp 98.6 F (37 C) (Oral)   Resp 18   Ht 5' (1.524 m)   Wt 57.6 kg   SpO2 95%   BMI 24.79 kg/m  Physical Exam Vitals and nursing note reviewed.  Constitutional:      General: She is not in acute distress.    Appearance: Normal appearance. She is well-developed.  HENT:      Head: Normocephalic and atraumatic.  Eyes:     Conjunctiva/sclera: Conjunctivae normal.  Cardiovascular:     Rate and Rhythm: Normal rate and regular rhythm.     Heart sounds: No murmur heard. Pulmonary:     Effort: Pulmonary effort is normal. No respiratory distress.     Breath sounds: Normal breath sounds.  Abdominal:     Palpations: Abdomen is soft.     Tenderness: There is abdominal tenderness (suprapubic). There is no guarding or rebound.  Musculoskeletal:        General: No swelling.     Cervical back: Neck supple.  Skin:    General: Skin is warm and dry.     Capillary Refill: Capillary refill takes less than 2 seconds.  Neurological:     General: No focal deficit present.     Mental Status: She is alert.     ED Results / Procedures / Treatments   Labs (all labs ordered are listed, but only abnormal results are displayed) Labs Reviewed  URINALYSIS, ROUTINE W REFLEX MICROSCOPIC - Abnormal; Notable for the following components:      Result Value   Color, Urine AMBER (*)    Protein, ur 100 (*)    Nitrite POSITIVE (*)    All other components within normal limits  BASIC METABOLIC PANEL - Abnormal; Notable for the following components:   Sodium 134 (*)    Chloride 96 (*)    Glucose, Bld 109 (*)    All other components within normal limits  URINE CULTURE  CBC WITH DIFFERENTIAL/PLATELET  MAGNESIUM    EKG None  Radiology CT Renal Stone Study Result Date: 12/17/2023 CLINICAL DATA:  Urinary frequency and retention since yesterday which has progressively worsened. Groin pressure. Flank pain. EXAM: CT ABDOMEN AND PELVIS WITHOUT CONTRAST TECHNIQUE: Multidetector CT imaging of the abdomen and pelvis was performed following the standard protocol without IV contrast. RADIATION DOSE REDUCTION: This exam was performed according to the departmental dose-optimization program which includes automated exposure control, adjustment of the mA and/or kV according to patient size and/or use  of iterative reconstruction technique. COMPARISON:  CT abdomen pelvis 07/16/2023 FINDINGS: Lower chest: Multiple bilateral pulmonary nodules are seen with the largest located in the right middle lobe measuring 6 mm. These nodules do not appear significantly changed in size compared to the CT from 06/18/2020 which indicates a benign etiology. Hepatobiliary: No focal liver abnormality is seen. Status post cholecystectomy. No biliary dilatation. Pancreas: Unremarkable. No pancreatic ductal dilatation or surrounding inflammatory changes. Spleen: Normal in size without focal abnormality. Adrenals/Urinary Tract: Adrenal glands are unremarkable. No renal or ureteral calculi. No hydronephrosis or hydroureter. Bladder is unremarkable. Stomach/Bowel: No bowel dilatation to indicate ileus or obstruction. Appendix is not definitively identified. No pericecal inflammatory changes are present to suggest occult appendicitis. Vascular/Lymphatic: Calcified atheromatous plaque seen throughout the abdominal aorta and visualized branches. No enlarged abdominal or pelvic lymph nodes. Reproductive: Status  post hysterectomy. 3.3 cm simple cyst in the right adnexa is unchanged in size since 07/16/2023. Other: No abdominal wall hernia or abnormality. No abdominopelvic ascites. Musculoskeletal: Laminectomy and fusion changes present at L4-L5. Degenerative changes seen throughout the lumbar spine. No acute osseous abnormality. IMPRESSION: 1. No acute abnormality of the abdomen or pelvis. 2. 3.3 cm simple cyst in the right adnexa is unchanged in size since 07/16/2023. Recommend follow-up US  in 6-12 months. Note: This recommendation does not apply to premenarchal patients and to those with increased risk (genetic, family history, elevated tumor markers or other high-risk factors) of ovarian cancer. Reference: JACR 2020 Feb; 17(2):248-254 Electronically Signed   By: Aliene Lloyd M.D.   On: 12/17/2023 10:52    Procedures Procedures     Medications Ordered in ED Medications  sodium chloride  0.9 % bolus 1,000 mL (has no administration in time range)  ondansetron  (ZOFRAN ) injection 4 mg (has no administration in time range)    ED Course/ Medical Decision Making/ A&P                                 Medical Decision Making Amount and/or Complexity of Data Reviewed Labs: ordered. Radiology: ordered.  Risk Prescription drug management.   This patient complains of suprapubic pain and urinary frequency; this involves an extensive number of treatment Options and is a complaint that carries with it a high risk of complications and morbidity. The differential includes UTI, pyelonephritis, renal stone, diverticulitis  I ordered, reviewed and interpreted labs, which included CBC normal chemistries unremarkable urinalysis possible infection sent for culture I ordered medication IV fluids and nausea medication and reviewed PMP when indicated. I ordered imaging studies which included CT renal and I independently    visualized and interpreted imaging which showed no acute findings Additional history obtained from patient's husband, admitted back in September for hyponatremia Previous records obtained and reviewed in epic.  Cardiac monitoring reviewed, sinus rhythm Social determinants considered, no significant barriers Critical Interventions: None  After the interventions stated above, I reevaluated the patient and found patient to be hemodynamically stable in no distress Admission and further testing considered, no indications for admission at this time.  Will cover with antibiotics for possible UTI although recommended close follow-up with PCP.  Return instructions discussed.         Final Clinical Impression(s) / ED Diagnoses Final diagnoses:  Acute cystitis without hematuria  Adnexal cyst    Rx / DC Orders ED Discharge Orders     None         Towana Ozell BROCKS, MD 12/17/23 1721

## 2023-12-17 NOTE — Discharge Instructions (Addendum)
 You are seen in the emergency department for urinary symptoms.  Your urinalysis showed possible signs of infection and we are starting you on an antibiotic.  The rest of your lab work was unremarkable.  You had a CAT scan that did not show a kidney stone but did show an adnexal cyst that will need a follow-up radiology test in 3 to 6 months.  Your primary care doctor can arrange this for you.  Follow-up with your regular doctor.  Return to the emergency department if any worsening or concerning symptoms

## 2023-12-17 NOTE — ED Triage Notes (Signed)
 Pt c/o urinary frequency and retention starting yesterday and progressively getting worse throughout the evening. Per spouse pt started taking AZO yesterday with no relief of symptoms. Pt states groin pressure. Pt also states a few episodes of diarrhea last night.

## 2023-12-18 LAB — URINE CULTURE: Culture: NO GROWTH

## 2024-01-23 ENCOUNTER — Encounter: Payer: Self-pay | Admitting: Podiatry

## 2024-01-23 ENCOUNTER — Ambulatory Visit (INDEPENDENT_AMBULATORY_CARE_PROVIDER_SITE_OTHER): Admitting: Podiatry

## 2024-01-23 ENCOUNTER — Ambulatory Visit (INDEPENDENT_AMBULATORY_CARE_PROVIDER_SITE_OTHER)

## 2024-01-23 DIAGNOSIS — M76821 Posterior tibial tendinitis, right leg: Secondary | ICD-10-CM

## 2024-01-23 DIAGNOSIS — M2041 Other hammer toe(s) (acquired), right foot: Secondary | ICD-10-CM | POA: Diagnosis not present

## 2024-01-23 MED ORDER — BETAMETHASONE SOD PHOS & ACET 6 (3-3) MG/ML IJ SUSP
3.0000 mg | Freq: Once | INTRAMUSCULAR | Status: AC
Start: 2024-01-23 — End: 2024-01-23
  Administered 2024-01-23: 3 mg via INTRA_ARTICULAR

## 2024-01-23 NOTE — Progress Notes (Signed)
 Chief Complaint  Patient presents with   Foot Pain    "I had some pain on the side of my foot.  My pain level is about a one now." N - pain in foot L - arch right D - 1 week O - suddenly, gotten better C - sore A - walking, when I got out of bed one morning T - soak my feet 3 times a week    HPI: 89 y.o. female presenting today for new complaint of pain and tenderness associated to the medial aspect of the right foot ongoing for about 1 week now.  There has been some improvement but she continues to have some tenderness to the medial aspect of the foot.  Past Medical History:  Diagnosis Date   Arthritis    Carpal tunnel syndrome of left wrist    Constipation    GERD (gastroesophageal reflux disease)    Hyperlipidemia    Hypertension    Pre-diabetes     Past Surgical History:  Procedure Laterality Date   ABDOMINAL HYSTERECTOMY     APPENDECTOMY     BACK SURGERY  1998   CARPAL TUNNEL RELEASE Left 03/06/2018   Procedure: LEFT CARPAL TUNNEL RELEASE;  Surgeon: Cindee Salt, MD;  Location: [facility];  Service: Orthopedics;  Laterality: Left;   CHOLECYSTECTOMY N/A 09/27/2016   Procedure: LAPAROSCOPIC CHOLECYSTECTOMY;  Surgeon: Ancil Linsey, MD;  Location: [facility];  Service: General;  Laterality: N/A;   COLONOSCOPY  04/26/2012   Procedure: COLONOSCOPY;  Surgeon: Malissa Hippo, MD;  Location: [facility];  Service: Endoscopy;  Laterality: N/A;  830   TONSILLECTOMY     TRIGGER FINGER RELEASE  11/20/2012   Procedure: RELEASE TRIGGER FINGER/A-1 PULLEY;  Surgeon: Nicki Reaper, MD;  Location: [facility];  Service: Orthopedics;  Laterality: Right;  RELEASE A-1 PULLEY RIGHT INDEX FINGER    Allergies  Allergen Reactions   Amlodipine Other (See Comments)    Flush/ not really sure if this is the correct medication   Bextra [Valdecoxib] Hives   Morphine And Codeine     Changed personality   Sertraline Nausea And Vomiting     Physical  Exam: General: The patient is alert and oriented x3 in no acute distress.  Dermatology: Skin is warm, dry and supple bilateral lower extremities.   Vascular: Palpable pedal pulses bilaterally. Capillary refill within normal limits.  No appreciable edema.  No erythema.  Neurological: Grossly intact via light touch  Musculoskeletal Exam: No pedal deformities noted.  There is some tenderness with palpation along the posterior tibial tendon as it inserts onto the navicular tuberosity and just plantar to the navicular tuberosity  Radiographic Exam RT foot 01/23/2024:  Diffuse osseous demineralization consistent with patient's age.  Moderate arthritic changes noted diffusely throughout the foot.  No acute fracture identified.  No osseous irregularities  Assessment/Plan of Care: 1.  Posterior tibial tendinitis right  -Patient evaluated.  X-rays reviewed -Injection of 0.5 cc Celestone Soluspan injected around the posterior tibial tendon right -Continue wearing good supportive tennis shoes and sneakers that do not irritate the medial aspect of the foot -Return to clinic as needed       Felecia Shelling, DPM [facility]  Dr. Felecia Shelling, DPM    [address]  [place], [place] [address]                Office [phone]  Fax [phone]

## 2024-08-07 ENCOUNTER — Other Ambulatory Visit (HOSPITAL_COMMUNITY): Payer: Self-pay | Admitting: Family Medicine

## 2024-08-07 DIAGNOSIS — M7989 Other specified soft tissue disorders: Secondary | ICD-10-CM

## 2024-08-08 ENCOUNTER — Ambulatory Visit (HOSPITAL_COMMUNITY)
Admission: RE | Admit: 2024-08-08 | Discharge: 2024-08-08 | Disposition: A | Discharge status: Home / Self Care | Source: Ambulatory Visit | Attending: Family Medicine | Admitting: Family Medicine

## 2024-08-08 DIAGNOSIS — M7989 Other specified soft tissue disorders: Secondary | ICD-10-CM | POA: Diagnosis present
